# Patient Record
Sex: Female | Born: 1960 | Race: White | Hispanic: No | Smoking: Current every day smoker
Health system: Southern US, Community
[De-identification: ages and names within clinical notes are randomized; demographics above are authoritative.]

## PROBLEM LIST (undated history)

## (undated) DIAGNOSIS — J449 Chronic obstructive pulmonary disease, unspecified: Secondary | ICD-10-CM

## (undated) DIAGNOSIS — E785 Hyperlipidemia, unspecified: Secondary | ICD-10-CM

## (undated) DIAGNOSIS — K219 Gastro-esophageal reflux disease without esophagitis: Secondary | ICD-10-CM

## (undated) DIAGNOSIS — E876 Hypokalemia: Secondary | ICD-10-CM

## (undated) DIAGNOSIS — F329 Major depressive disorder, single episode, unspecified: Secondary | ICD-10-CM

## (undated) DIAGNOSIS — K7460 Unspecified cirrhosis of liver: Secondary | ICD-10-CM

## (undated) DIAGNOSIS — D649 Anemia, unspecified: Secondary | ICD-10-CM

## (undated) DIAGNOSIS — F32A Depression, unspecified: Secondary | ICD-10-CM

## (undated) DIAGNOSIS — N189 Chronic kidney disease, unspecified: Secondary | ICD-10-CM

## (undated) DIAGNOSIS — E871 Hypo-osmolality and hyponatremia: Secondary | ICD-10-CM

## (undated) DIAGNOSIS — F419 Anxiety disorder, unspecified: Secondary | ICD-10-CM

## (undated) DIAGNOSIS — IMO0002 Reserved for concepts with insufficient information to code with codable children: Secondary | ICD-10-CM

## (undated) DIAGNOSIS — E119 Type 2 diabetes mellitus without complications: Secondary | ICD-10-CM

## (undated) DIAGNOSIS — I10 Essential (primary) hypertension: Secondary | ICD-10-CM

## (undated) DIAGNOSIS — G709 Myoneural disorder, unspecified: Secondary | ICD-10-CM

## (undated) DIAGNOSIS — F1910 Other psychoactive substance abuse, uncomplicated: Secondary | ICD-10-CM

## (undated) HISTORY — DX: Unspecified cirrhosis of liver: K74.60

## (undated) HISTORY — DX: Chronic obstructive pulmonary disease, unspecified: J44.9

## (undated) HISTORY — DX: Myoneural disorder, unspecified: G70.9

## (undated) HISTORY — DX: Chronic kidney disease, unspecified: N18.9

## (undated) HISTORY — DX: Hypokalemia: E87.6

## (undated) HISTORY — PX: FRACTURE SURGERY: SHX138

## (undated) HISTORY — DX: Type 2 diabetes mellitus without complications: E11.9

## (undated) HISTORY — PX: CHOLECYSTECTOMY: SHX55

## (undated) HISTORY — DX: Other psychoactive substance abuse, uncomplicated: F19.10

## (undated) HISTORY — PX: SPINE SURGERY: SHX786

## (undated) HISTORY — DX: Anxiety disorder, unspecified: F41.9

## (undated) HISTORY — DX: Reserved for concepts with insufficient information to code with codable children: IMO0002

## (undated) HISTORY — DX: Gastro-esophageal reflux disease without esophagitis: K21.9

## (undated) HISTORY — PX: ORTHOPEDIC SURGERY: SHX850

## (undated) HISTORY — DX: Hyperlipidemia, unspecified: E78.5

## (undated) HISTORY — PX: TUBAL LIGATION: SHX77

## (undated) HISTORY — DX: Anemia, unspecified: D64.9

## (undated) HISTORY — DX: Hypo-osmolality and hyponatremia: E87.1

---

## 2015-06-17 ENCOUNTER — Inpatient Hospital Stay (HOSPITAL_COMMUNITY): Payer: Self-pay

## 2015-06-17 ENCOUNTER — Encounter (HOSPITAL_COMMUNITY): Payer: Self-pay | Admitting: *Deleted

## 2015-06-17 ENCOUNTER — Inpatient Hospital Stay (HOSPITAL_COMMUNITY): Payer: MEDICAID

## 2015-06-17 ENCOUNTER — Inpatient Hospital Stay (HOSPITAL_COMMUNITY)
Admission: EM | Admit: 2015-06-17 | Discharge: 2015-06-21 | DRG: 918 | Disposition: A | Discharge status: Other Acute Inpatient Hospital | Payer: Self-pay | Attending: Internal Medicine | Admitting: Internal Medicine

## 2015-06-17 DIAGNOSIS — F10229 Alcohol dependence with intoxication, unspecified: Secondary | ICD-10-CM | POA: Diagnosis present

## 2015-06-17 DIAGNOSIS — T450X2D Poisoning by antiallergic and antiemetic drugs, intentional self-harm, subsequent encounter: Secondary | ICD-10-CM

## 2015-06-17 DIAGNOSIS — T50901A Poisoning by unspecified drugs, medicaments and biological substances, accidental (unintentional), initial encounter: Secondary | ICD-10-CM

## 2015-06-17 DIAGNOSIS — R339 Retention of urine, unspecified: Secondary | ICD-10-CM | POA: Diagnosis present

## 2015-06-17 DIAGNOSIS — R45851 Suicidal ideations: Secondary | ICD-10-CM

## 2015-06-17 DIAGNOSIS — T450X2A Poisoning by antiallergic and antiemetic drugs, intentional self-harm, initial encounter: Principal | ICD-10-CM

## 2015-06-17 DIAGNOSIS — I10 Essential (primary) hypertension: Secondary | ICD-10-CM | POA: Diagnosis present

## 2015-06-17 DIAGNOSIS — R109 Unspecified abdominal pain: Secondary | ICD-10-CM

## 2015-06-17 DIAGNOSIS — F1020 Alcohol dependence, uncomplicated: Secondary | ICD-10-CM | POA: Diagnosis not present

## 2015-06-17 DIAGNOSIS — Z9049 Acquired absence of other specified parts of digestive tract: Secondary | ICD-10-CM | POA: Diagnosis present

## 2015-06-17 DIAGNOSIS — E876 Hypokalemia: Secondary | ICD-10-CM

## 2015-06-17 DIAGNOSIS — F329 Major depressive disorder, single episode, unspecified: Secondary | ICD-10-CM | POA: Diagnosis not present

## 2015-06-17 DIAGNOSIS — R945 Abnormal results of liver function studies: Secondary | ICD-10-CM

## 2015-06-17 DIAGNOSIS — F1994 Other psychoactive substance use, unspecified with psychoactive substance-induced mood disorder: Secondary | ICD-10-CM | POA: Diagnosis present

## 2015-06-17 DIAGNOSIS — F17210 Nicotine dependence, cigarettes, uncomplicated: Secondary | ICD-10-CM | POA: Diagnosis present

## 2015-06-17 DIAGNOSIS — T450X1A Poisoning by antiallergic and antiemetic drugs, accidental (unintentional), initial encounter: Secondary | ICD-10-CM | POA: Diagnosis present

## 2015-06-17 DIAGNOSIS — F419 Anxiety disorder, unspecified: Secondary | ICD-10-CM | POA: Diagnosis present

## 2015-06-17 DIAGNOSIS — R7989 Other specified abnormal findings of blood chemistry: Secondary | ICD-10-CM

## 2015-06-17 DIAGNOSIS — R251 Tremor, unspecified: Secondary | ICD-10-CM | POA: Diagnosis present

## 2015-06-17 HISTORY — DX: Essential (primary) hypertension: I10

## 2015-06-17 HISTORY — DX: Major depressive disorder, single episode, unspecified: F32.9

## 2015-06-17 HISTORY — DX: Depression, unspecified: F32.A

## 2015-06-17 LAB — CBC WITH DIFFERENTIAL/PLATELET
Basophils Absolute: 0 10*3/uL (ref 0.0–0.1)
Basophils Absolute: 0 K/uL (ref 0.0–0.1)
Basophils Relative: 1 % (ref 0–1)
Basophils Relative: 0 % (ref 0–1)
EOS PCT: 1 % (ref 0–5)
Eosinophils Absolute: 0.1 10*3/uL (ref 0.0–0.7)
Eosinophils Absolute: 0.1 K/uL (ref 0.0–0.7)
Eosinophils Relative: 1 % (ref 0–5)
HCT: 41.8 % (ref 36.0–46.0)
HEMATOCRIT: 42.5 % (ref 36.0–46.0)
Hemoglobin: 15 g/dL (ref 12.0–15.0)
Hemoglobin: 14.5 g/dL (ref 12.0–15.0)
LYMPHS ABS: 2.7 10*3/uL (ref 0.7–4.0)
Lymphocytes Relative: 41 % (ref 12–46)
Lymphocytes Relative: 29 % (ref 12–46)
Lymphs Abs: 2.3 K/uL (ref 0.7–4.0)
MCH: 35 pg — ABNORMAL HIGH (ref 26.0–34.0)
MCH: 35 pg — ABNORMAL HIGH (ref 26.0–34.0)
MCHC: 35.3 g/dL (ref 30.0–36.0)
MCHC: 34.7 g/dL (ref 30.0–36.0)
MCV: 99.3 fL (ref 78.0–100.0)
MCV: 101 fL — ABNORMAL HIGH (ref 78.0–100.0)
Monocytes Absolute: 0.6 10*3/uL (ref 0.1–1.0)
Monocytes Absolute: 0.7 K/uL (ref 0.1–1.0)
Monocytes Relative: 10 % (ref 3–12)
Monocytes Relative: 9 % (ref 3–12)
NEUTROS ABS: 3.1 10*3/uL (ref 1.7–7.7)
Neutro Abs: 4.8 K/uL (ref 1.7–7.7)
Neutrophils Relative %: 47 % (ref 43–77)
Neutrophils Relative %: 61 % (ref 43–77)
Platelets: 242 10*3/uL (ref 150–400)
Platelets: 245 K/uL (ref 150–400)
RBC: 4.28 MIL/uL (ref 3.87–5.11)
RBC: 4.14 MIL/uL (ref 3.87–5.11)
RDW: 11.7 % (ref 11.5–15.5)
RDW: 11.8 % (ref 11.5–15.5)
WBC: 6.6 10*3/uL (ref 4.0–10.5)
WBC: 7.9 K/uL (ref 4.0–10.5)

## 2015-06-17 LAB — COMPREHENSIVE METABOLIC PANEL WITH GFR
ALT: 37 U/L (ref 14–54)
AST: 83 U/L — ABNORMAL HIGH (ref 15–41)
Albumin: 3.7 g/dL (ref 3.5–5.0)
Alkaline Phosphatase: 153 U/L — ABNORMAL HIGH (ref 38–126)
Anion gap: 10 (ref 5–15)
BUN: <5 mg/dL — ABNORMAL LOW (ref 6–20)
CO2: 28 mmol/L (ref 22–32)
Calcium: 8.8 mg/dL — ABNORMAL LOW (ref 8.9–10.3)
Chloride: 101 mmol/L (ref 101–111)
Creatinine, Ser: 0.47 mg/dL (ref 0.44–1.00)
GFR calc Af Amer: >60 mL/min
GFR calc non Af Amer: >60 mL/min
Glucose, Bld: 131 mg/dL — ABNORMAL HIGH (ref 65–99)
Potassium: 3.4 mmol/L — ABNORMAL LOW (ref 3.5–5.1)
Sodium: 139 mmol/L (ref 135–145)
Total Bilirubin: 1.3 mg/dL — ABNORMAL HIGH (ref 0.3–1.2)
Total Protein: 6.4 g/dL — ABNORMAL LOW (ref 6.5–8.1)

## 2015-06-17 LAB — COMPREHENSIVE METABOLIC PANEL
ALT: 41 U/L (ref 14–54)
AST: 100 U/L — AB (ref 15–41)
Albumin: 3.8 g/dL (ref 3.5–5.0)
Alkaline Phosphatase: 159 U/L — ABNORMAL HIGH (ref 38–126)
Anion gap: 17 — ABNORMAL HIGH (ref 5–15)
BUN: <5 mg/dL — ABNORMAL LOW (ref 6–20)
CALCIUM: 9.2 mg/dL (ref 8.9–10.3)
CO2: 27 mmol/L (ref 22–32)
CREATININE: 0.58 mg/dL (ref 0.44–1.00)
Chloride: 97 mmol/L — ABNORMAL LOW (ref 101–111)
GFR calc Af Amer: >60 mL/min (ref >60)
Glucose, Bld: 126 mg/dL — ABNORMAL HIGH (ref 65–99)
Potassium: 2.5 mmol/L — CL (ref 3.5–5.1)
Sodium: 141 mmol/L (ref 135–145)
Total Bilirubin: 0.6 mg/dL (ref 0.3–1.2)
Total Protein: 6.9 g/dL (ref 6.5–8.1)

## 2015-06-17 LAB — RAPID URINE DRUG SCREEN, HOSP PERFORMED
AMPHETAMINES: NOT DETECTED
BENZODIAZEPINES: NOT DETECTED
Barbiturates: NOT DETECTED
COCAINE: NOT DETECTED
OPIATES: NOT DETECTED
TETRAHYDROCANNABINOL: NOT DETECTED

## 2015-06-17 LAB — MRSA PCR SCREENING: MRSA by PCR: NEGATIVE

## 2015-06-17 LAB — TROPONIN I
Troponin I: <0.03 ng/mL (ref <0.031)
Troponin I: <0.03 ng/mL

## 2015-06-17 LAB — SALICYLATE LEVEL
Salicylate Lvl: 10.2 mg/dL (ref 2.8–30.0)
Salicylate Lvl: <4 mg/dL (ref 2.8–30.0)

## 2015-06-17 LAB — ACETAMINOPHEN LEVEL

## 2015-06-17 LAB — MAGNESIUM: Magnesium: 1.9 mg/dL (ref 1.7–2.4)

## 2015-06-17 LAB — LIPASE, BLOOD: Lipase: 34 U/L (ref 22–51)

## 2015-06-17 LAB — ETHANOL: Alcohol, Ethyl (B): 66 mg/dL — ABNORMAL HIGH (ref <5)

## 2015-06-17 MED ORDER — POTASSIUM CHLORIDE 10 MEQ/100ML IV SOLN
10.0000 meq | INTRAVENOUS | Status: AC
  Administered 2015-06-17: 10 meq via INTRAVENOUS
  Filled 2015-06-17: qty 100

## 2015-06-17 MED ORDER — ADULT MULTIVITAMIN W/MINERALS CH
1.0000 | ORAL_TABLET | Freq: Every day | ORAL | Status: DC
  Administered 2015-06-17 – 2015-06-21 (×5): 1 via ORAL
  Filled 2015-06-17 (×5): qty 1

## 2015-06-17 MED ORDER — FOLIC ACID 1 MG PO TABS
1.0000 mg | ORAL_TABLET | Freq: Every day | ORAL | Status: DC
  Administered 2015-06-17 – 2015-06-21 (×5): 1 mg via ORAL
  Filled 2015-06-17 (×5): qty 1

## 2015-06-17 MED ORDER — SODIUM CHLORIDE 0.9 % IV SOLN
INTRAVENOUS | Status: DC
  Administered 2015-06-17: 05:00:00 via INTRAVENOUS

## 2015-06-17 MED ORDER — LORAZEPAM 1 MG PO TABS: 1.0000 mg | ORAL_TABLET | Freq: Four times a day (QID) | ORAL | Status: DC | PRN

## 2015-06-17 MED ORDER — LORAZEPAM 2 MG/ML IJ SOLN
0.5000 mg | Freq: Once | INTRAMUSCULAR | Status: AC
  Administered 2015-06-17: 0.5 mg via INTRAVENOUS
  Filled 2015-06-17: qty 1

## 2015-06-17 MED ORDER — LORAZEPAM 2 MG/ML IJ SOLN: 1.0000 mg | Freq: Once | INTRAMUSCULAR | Status: DC

## 2015-06-17 MED ORDER — THIAMINE HCL 100 MG/ML IJ SOLN
100.0000 mg | Freq: Every day | INTRAMUSCULAR | Status: DC
  Administered 2015-06-17: 100 mg via INTRAVENOUS
  Filled 2015-06-17 (×2): qty 2

## 2015-06-17 MED ORDER — POTASSIUM CHLORIDE IN NACL 20-0.9 MEQ/L-% IV SOLN
INTRAVENOUS | Status: AC
  Administered 2015-06-17 – 2015-06-18 (×3): via INTRAVENOUS
  Filled 2015-06-17 (×3): qty 1000

## 2015-06-17 MED ORDER — POTASSIUM CHLORIDE CRYS ER 20 MEQ PO TBCR
40.0000 meq | EXTENDED_RELEASE_TABLET | Freq: Once | ORAL | Status: AC
  Administered 2015-06-17: 40 meq via ORAL
  Filled 2015-06-17: qty 2

## 2015-06-17 MED ORDER — LORAZEPAM 2 MG/ML IJ SOLN: 2.0000 mg | Freq: Once | INTRAMUSCULAR | Status: DC

## 2015-06-17 MED ORDER — LORAZEPAM 2 MG/ML IJ SOLN: 0.0000 mg | Freq: Two times a day (BID) | INTRAMUSCULAR | Status: DC

## 2015-06-17 MED ORDER — POTASSIUM CHLORIDE 10 MEQ/100ML IV SOLN
10.0000 meq | INTRAVENOUS | Status: AC
  Administered 2015-06-17 (×2): 10 meq via INTRAVENOUS
  Filled 2015-06-17 (×2): qty 100

## 2015-06-17 MED ORDER — LORAZEPAM 2 MG/ML IJ SOLN
1.0000 mg | Freq: Four times a day (QID) | INTRAMUSCULAR | Status: DC | PRN
  Administered 2015-06-18: 1 mg via INTRAVENOUS
  Filled 2015-06-17: qty 1

## 2015-06-17 MED ORDER — VITAMIN B-1 100 MG PO TABS
100.0000 mg | ORAL_TABLET | Freq: Every day | ORAL | Status: DC
  Administered 2015-06-18 – 2015-06-21 (×4): 100 mg via ORAL
  Filled 2015-06-17 (×4): qty 1

## 2015-06-17 MED ORDER — ENOXAPARIN SODIUM 40 MG/0.4ML ~~LOC~~ SOLN
40.0000 mg | SUBCUTANEOUS | Status: DC
  Administered 2015-06-17 – 2015-06-21 (×5): 40 mg via SUBCUTANEOUS
  Filled 2015-06-17 (×5): qty 0.4

## 2015-06-17 MED ORDER — ACETAMINOPHEN 650 MG RE SUPP: 650.0000 mg | Freq: Four times a day (QID) | RECTAL | Status: DC | PRN

## 2015-06-17 MED ORDER — ONDANSETRON HCL 4 MG/2ML IJ SOLN: 4.0000 mg | Freq: Four times a day (QID) | INTRAMUSCULAR | Status: DC | PRN

## 2015-06-17 MED ORDER — ACETAMINOPHEN 325 MG PO TABS
650.0000 mg | ORAL_TABLET | Freq: Four times a day (QID) | ORAL | Status: DC | PRN
  Administered 2015-06-20 – 2015-06-21 (×2): 650 mg via ORAL
  Filled 2015-06-17 (×2): qty 2

## 2015-06-17 MED ORDER — PANTOPRAZOLE SODIUM 40 MG IV SOLR
40.0000 mg | INTRAVENOUS | Status: DC
  Administered 2015-06-17 – 2015-06-18 (×2): 40 mg via INTRAVENOUS
  Filled 2015-06-17 (×2): qty 40

## 2015-06-17 MED ORDER — ONDANSETRON HCL 4 MG PO TABS
4.0000 mg | ORAL_TABLET | Freq: Four times a day (QID) | ORAL | Status: DC | PRN
  Administered 2015-06-20: 4 mg via ORAL
  Filled 2015-06-17: qty 1

## 2015-06-17 MED ORDER — LORAZEPAM 2 MG/ML IJ SOLN
0.0000 mg | Freq: Four times a day (QID) | INTRAMUSCULAR | Status: DC
  Administered 2015-06-17: 2 mg via INTRAVENOUS
  Administered 2015-06-18: 1 mg via INTRAVENOUS
  Filled 2015-06-17 (×2): qty 1
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
## 2015-06-17 NOTE — ED Notes (Signed)
Per [facility] - pt admits to seeing a man that is looking in windows however no man is there - pt admits to attempting to overdose on benadryl. Pt recently separated from her husband and is living in a hotel. Pt is slow to respond however A&Ox4.

## 2015-06-17 NOTE — Care Management Note (Signed)
Case Management Note  Patient Details  Name: Michelle Michael MRN: [id number] Date of Birth: [birth year]  Subjective/Objective:                 overdose   Action/Plan:  bhh   Expected Discharge Date:       [id number]           [facility]  [facility]  In-House Referral:  Clinical Social Work  Discharge planning Services  CM Consult  Post Acute Care Choice:  NA Choice offered to:  NA  DME Arranged:  N/A DME Agency:  NA  HH Arranged:  NA HH Agency:  NA  Status of Service:  In process, will continue to follow  Medicare Important Message Given:    Date Medicare IM Given:    Medicare IM give by:    Date Additional Medicare IM Given:    Additional Medicare Important Message give by:     If discussed at Long Length of Stay Meetings, dates discussed:    Additional Comments:  Golda Acre, RN 06/17/2015, 8:45 AM

## 2015-06-17 NOTE — ED Provider Notes (Signed)
CSN: [id number]     Arrival date & time 06/17/15  0204 History   First MD Initiated Contact with Patient 06/17/15 0209     Chief Complaint  Patient presents with  . Drug Overdose  . Hallucinations     (Consider location/radiation/quality/duration/timing/severity/associated sxs/prior Treatment) HPI Patient presents by EMS after intentional overdose of Benadryl. Patient says she took a handful of Benadryl because she "did not want to wake up". She does not know the dosage of the Benadryl or the exact number. Per EMS there were 24 tabs missing from the bottle. She states she has a prior history of depression but is currently on no medication. She has overdosed on medication in the past. She states that she saw a man looking in her windows. She is currently denying any hallucinations. Admits to dry mouth. She states she drank alcohol earlier this today but denies any other coingestants. Past Medical History  Diagnosis Date  . Depression   . Hypertension    Past Surgical History  Procedure Laterality Date  . Orthopedic surgery     History reviewed. No pertinent family history. History  Substance Use Topics  . Smoking status: Current Every Day Smoker -- 1.00 packs/day    Types: Cigarettes  . Smokeless tobacco: Not on file  . Alcohol Use: Yes     Comment: "2 quartz of beer a day"   OB History    No data available     Review of Systems  Respiratory: Negative for shortness of breath.   Cardiovascular: Negative for chest pain.  Gastrointestinal: Negative for nausea, vomiting and abdominal pain.  Musculoskeletal: Negative for back pain and neck pain.  Skin: Negative for rash.  Neurological: Positive for headaches. Negative for dizziness, weakness and numbness.  Psychiatric/Behavioral: Positive for suicidal ideas, hallucinations, self-injury and dysphoric mood. The patient is nervous/anxious.   All other systems reviewed and are negative.     Allergies  Review of patient's  allergies indicates no known allergies.  Home Medications   Prior to Admission medications   Medication Sig Start Date End Date Taking? Authorizing Provider  acetaminophen (TYLENOL) 325 MG tablet Take 650 mg by mouth every 6 (six) hours as needed for moderate pain.   Yes Historical Provider, MD  Aspirin-Acetaminophen-Caffeine (GOODYS EXTRA STRENGTH) [phone] MG PACK Take 1 Package by mouth every 6 (six) hours as needed (pain).   Yes Historical Provider, MD  diphenhydrAMINE (BENADRYL) 25 MG tablet Take 25 mg by mouth every 6 (six) hours as needed for allergies.   Yes Historical Provider, MD  hydroxypropyl methylcellulose / hypromellose (ISOPTO TEARS / GONIOVISC) 2.5 % ophthalmic solution Place 1 drop into both eyes 3 (three) times daily as needed for dry eyes.   Yes Historical Provider, MD   BP 133/72 mmHg  Pulse 119  Temp(Src) 98.7 F (37.1 C) (Oral)  Resp 20  SpO2 96% Physical Exam  Constitutional: She is oriented to person, place, and time. She appears well-developed and well-nourished. No distress.  HENT:  Head: Normocephalic and atraumatic.  Mouth/Throat: Oropharynx is clear and moist.  Eyes: EOM are normal. Pupils are equal, round, and reactive to light.  Neck: Normal range of motion. Neck supple.  Cardiovascular: Regular rhythm.   Tachycardia  Pulmonary/Chest: Effort normal and breath sounds normal. No respiratory distress. She has no wheezes. She has no rales.  Abdominal: Soft. Bowel sounds are normal.  Musculoskeletal: Normal range of motion. She exhibits no edema or tenderness.  Neurological: She is alert and oriented to person,  place, and time.  Moves all extremities.  Skin: Skin is warm and dry. No rash noted. No erythema.  Psychiatric:  Patient is very anxious appearing. She is tearful.  Nursing note and vitals reviewed.   ED Course  Procedures (including critical care time) Labs Review Labs Reviewed  CBC WITH DIFFERENTIAL/PLATELET - Abnormal; Notable for the  following:    MCH 35.0 (*)    All other components within normal limits  COMPREHENSIVE METABOLIC PANEL - Abnormal; Notable for the following:    Potassium 2.5 (*)    Chloride 97 (*)    Glucose, Bld 126 (*)    BUN <5 (*)    AST 100 (*)    Alkaline Phosphatase 159 (*)    Anion gap 17 (*)    All other components within normal limits  ETHANOL - Abnormal; Notable for the following:    Alcohol, Ethyl (B) 66 (*)    All other components within normal limits  ACETAMINOPHEN LEVEL - Abnormal; Notable for the following:    Acetaminophen (Tylenol), Serum <10 (*)    All other components within normal limits  SALICYLATE LEVEL  URINE RAPID DRUG SCREEN, HOSP PERFORMED    Imaging Review No results found.   EKG Interpretation   Date/Time:  Wednesday June 17 2015 02:41:49 EDT Ventricular Rate:  111 PR Interval:  153 QRS Duration: 88 QT Interval:  371 QTC Calculation: 504 R Axis:   -21 Text Interpretation:  Sinus tachycardia Biatrial enlargement Probable  inferior infarct, old Confirmed by Ranae Palms  MD, Lissette Schenk ([id number]) on  06/17/2015 4:25:52 AM      MDM   Final diagnoses:  Intentional diphenhydramine overdose, initial encounter  Hypokalemia    RN discussed with poison control. Recommended observation for 6 hours and supportive care for anticholinergic side effects.  Patient with persistent agitation, tachycardia. Potassium replacement initiated in the emergency department. Discuss with Dr.Kakrakandy and will admit to step down bed.  Loren Racer, MD 06/17/15 [phone]

## 2015-06-17 NOTE — H&P (Addendum)
[facility] History and Physical  [place] SMO:[id number] DOB: [birth year] DOA: 06/17/2015  Referring physician: Dr.Yelverton. PCP: No PCP Per Patient None. Specialists: None.  Chief Complaint: Drug overdose.  HPI: Michelle Michael is a 54 y.o. female with history of depression and hypertension present on no medications was brought to the ER after patient called EMS after taking 24 pills of diphenhydramine (strength exactly not known). Patient states she was depressed and suicidal. In the ER patient was found to be tremulous and tachycardic with mild confusion and poison control was contacted and was advised to give patient when necessary Ativan as needed based on patient's symptoms. Patient is also positive for alcohol and patient states she drinks alcohol everyday. Patient states she has been recently separated from her husband and has been depressed. Denies overdosing with any other medications. Patient does take Goody powder everyday for headache. Salicylate levels are undetectable. Patient has been admitted for further management of drug overdose and suicidal.   Review of Systems: As presented in the history of presenting illness, rest negative.  Past Medical History  Diagnosis Date  . Depression   . Hypertension    Past Surgical History  Procedure Laterality Date  . Orthopedic surgery    . Cholecystectomy     Social History:  reports that she has been smoking Cigarettes.  She has been smoking about 1.00 pack per day. She does not have any smokeless tobacco history on file. She reports that she drinks alcohol. She reports that she does not use illicit drugs. Where does patient live home. Can patient participate in ADLs? Yes.  No Known Allergies  Family History:  Family History  Problem Relation Age of Onset  . Hypertension Mother   . Hypertension Father       Prior to Admission medications   Medication Sig Start Date End Date Taking? Authorizing Provider  acetaminophen  (TYLENOL) 325 MG tablet Take 650 mg by mouth every 6 (six) hours as needed for moderate pain.   Yes Historical Provider, MD  Aspirin-Acetaminophen-Caffeine (GOODYS EXTRA STRENGTH) [phone] MG PACK Take 1 Package by mouth every 6 (six) hours as needed (pain).   Yes Historical Provider, MD  diphenhydrAMINE (BENADRYL) 25 MG tablet Take 25 mg by mouth every 6 (six) hours as needed for allergies.   Yes Historical Provider, MD  hydroxypropyl methylcellulose / hypromellose (ISOPTO TEARS / GONIOVISC) 2.5 % ophthalmic solution Place 1 drop into both eyes 3 (three) times daily as needed for dry eyes.   Yes Historical Provider, MD    Physical Exam: Filed Vitals:   06/17/15 0345 06/17/15 0430 06/17/15 0445 06/17/15 0501  BP: 133/72 160/92 140/87   Pulse: 119 112 108 106  Temp:      TempSrc:      Resp: 20 18 16 17   SpO2: 96% 90% 88% 91%     General:  Moderately built and nourished.  Eyes: Anicteric. No pallor.  ENT: No discharge from the ears eyes nose and mouth.  Neck: No mass felt.  Cardiovascular: S1 and S2 heard.  Respiratory: No rhonchi or crepitations.  Abdomen: Soft nontender bowel sounds present.  Skin: No rash.  Musculoskeletal: No edema.  Psychiatric: Patient has suicidal ideation. Patient is depressed.  Neurologic: Alert awake oriented to time place and person. Moves all extremities. Patient has tremors.  Labs on Admission:  Basic Metabolic Panel:  Recent Labs Lab 06/17/15 0255  NA 141  K 2.5*  CL 97*  CO2 27  GLUCOSE 126*  BUN <5*  CREATININE 0.58  CALCIUM 9.2   Liver Function Tests:  Recent Labs Lab 06/17/15 0255  AST 100*  ALT 41  ALKPHOS 159*  BILITOT 0.6  PROT 6.9  ALBUMIN 3.8   No results for input(s): LIPASE, AMYLASE in the last 168 hours. No results for input(s): AMMONIA in the last 168 hours. CBC:  Recent Labs Lab 06/17/15 0255  WBC 6.6  NEUTROABS 3.1  HGB 15.0  HCT 42.5  MCV 99.3  PLT 242   Cardiac Enzymes: No results for  input(s): CKTOTAL, CKMB, CKMBINDEX, TROPONINI in the last 168 hours.  BNP (last 3 results) No results for input(s): BNP in the last 8760 hours.  ProBNP (last 3 results) No results for input(s): PROBNP in the last 8760 hours.  CBG: No results for input(s): GLUCAP in the last 168 hours.  Radiological Exams on Admission: No results found.  EKG: Independently reviewed. Sinus tachycardia with QRS of 88 ms and QTC of 504 ms.  Assessment/Plan Principal Problem:   Intentional diphenhydramine overdose Active Problems:   Suicidal ideation   Alcoholism   Hypokalemia   Diphenhydramine overdose   1. Intentional overdose of diphenhydramine with suicidal ideation - I have discussed with poison control at this time and point and control is advised to give when necessary Ativan based on patient's symptoms. Mainly watch out for any encephalopathy increasing heart rate elevated blood pressure when patient may need additional doses of Ativan every 45 minutes. Patient is already placed on when necessary Ativan for alcohol withdrawal protocol and may need additional doses based on the above symptoms mentioned. Closely monitor and stepdown as patient is likely to have prolonged course as per the poison control. Closely monitor for any seizures. Suicidal precautions. Consult psychiatry once patient is medically stable. 2. Hypokalemia - replace and recheck. Check magnesium levels. 3. Depression with suicidal ideation - consult psych Ativan patient is medically stable. Suicide precautions. 4. Alcoholism - patient has been placed on alcohol withdrawal protocol. 5. Elevated LFTs - possibly secondary to alcoholism. Check acute hepatitis panel.  Addendum - after arrival to the stepdown unit patient started complaining of severe epigastric pain. I have ordered a stat lipase, recheck LFTs, troponin, sonogram of abdomen, and place patient on Protonix. Check EKG.   DVT Prophylaxis Lovenox.  Code Status: Full code.   Family Communication: Discussed with patient.  Disposition Plan: Admit to inpatient.    KAKRAKANDY,ARSHAD N. [facility] Pager [phone].  If 7PM-7AM, please contact night-coverage [email or web address] Password [facility] 06/17/2015, 5:29 AM

## 2015-06-17 NOTE — Progress Notes (Signed)
Date:  June 16, 2015 U.R. performed for needs and level of care. Will continue to follow for Case Management needs.  Altus Zaino, RN, BSN, CCM   [phone] 

## 2015-06-17 NOTE — ED Notes (Signed)
Pt states "I just took a handful of pills because I just wanted to go to sleep." Pt tearful during discussion of SI.

## 2015-06-17 NOTE — Clinical Social Work Psych Assess (Signed)
Clinical Social Work Librarian, academic  Clinical Social Worker:  [place], [place] Date/Time:  06/17/2015, 2:17 PM Referred By:  Physician Date Referred:  06/17/15 Reason for Referral:  Behavioral Health Issues   Presenting Symptoms/Problems  Presenting Symptoms/Problems(in person's/family's own words):  Psych consulted due to overdose.   Abuse/Neglect/Trauma History  Abuse/Neglect/Trauma History:  Denies History Abuse/Neglect/Trauma History Comments (indicate dates):  N/A   Psychiatric History  Psychiatric History:  Denies History Psychiatric Medication:  None currently   Current Mental Health Hospitalizations/Previous Mental Health History:  Patient was at [facility] about 6 months ago for detox. Patient denies any previous MH hospitalizations.    Current Provider:  N/A Place and Date:  N/A  Current Medications:    Scheduled Meds: . enoxaparin (LOVENOX) injection  40 mg Subcutaneous Q24H  . folic acid  1 mg Oral Daily  . LORazepam  0-4 mg Intravenous Q6H   Followed by  . [START ON 06/19/2015] LORazepam  0-4 mg Intravenous Q12H  . LORazepam  1 mg Intravenous Once  . multivitamin with minerals  1 tablet Oral Daily  . pantoprazole (PROTONIX) IV  40 mg Intravenous Q24H  . thiamine  100 mg Oral Daily   Or  . thiamine  100 mg Intravenous Daily   Continuous Infusions: . 0.9 % NaCl with KCl 20 mEq / L 100 mL/hr at 06/17/15 0628   PRN Meds:.acetaminophen **OR** acetaminophen, LORazepam **OR** LORazepam, ondansetron **OR** ondansetron (ZOFRAN) IV     Previous Inpatient Admission/Date/Reason:  Patient had detox at [facility] about 6 months ago. Patient had residential SA treatment several years ago.   Emotional Health/Current Symptoms  Suicide/Self Harm: Suicide Attempt in the Past (date/description) Suicide Attempt in Past (date/description):  Patient admitted after drinking alcohol and overdosing on Benadryl   Other Harmful Behavior  (ex. homicidal ideation) (describe):  N/A   Psychotic/Dissociative Symptoms  Psychotic/Dissociative Symptoms: Visual Hallucinations Other Psychotic/Dissociative Symptoms:  Patient reports she saw a man at her hotel but knows that he was not real. Patient reports this was first [facility].   Attention/Behavioral Symptoms  Attention/Behavioral Symptoms: Withdrawn Other Attention/Behavioral Symptoms:  Patient guarded with flat affect. Patient took long pauses prior to answering questions.    Cognitive Impairment  Cognitive Impairment:  Within Normal Limits Other Cognitive Impairment:  N/A   Mood and Adjustment  Mood and Adjustment:  Flat   Stress, Anxiety, Trauma, Any Recent Loss/Stressor  Stress, Anxiety, Trauma, Any Recent Loss/Stressor: Relationship, Other - See Comment Anxiety (frequency):  N/A  Phobia (specify):  N/A  Compulsive Behavior (specify):  N/A  Obsessive Behavior (specify):  N/A  Other Stress, Anxiety, Trauma, Any Recent Loss/Stressor:  Patient and boyfriend of 9 years recently separated. Patient is currently homeless and staying at a hotel. Patient is unemployed.   Substance Abuse/Use  Substance Abuse/Use: Current Substance Use SBIRT Completed (please refer for detailed history): Yes Self-reported Substance Use (last use and frequency):  Patient reports that she last drank on day of admission. Patient reports a long history of substance use. Patient has experienced DTs in the past and feels shaky and sweaty today.  Urinary Drug Screen Completed: Yes Alcohol Level:  66   Environment/Housing/Living Arrangement  Environmental/Housing/Living Arrangement: Homeless Who is in the Home:  Hotel  Emergency Contact:  N/A   Financial  Financial: IPRS   Patient's Strengths and Goals  Patient's Strengths and Goals (patient's own words):  Patient has attempted treatment in the past for substance use.    Clinical  Social Worker's Interpretive  Summary  Clinical Social Workers Interpretive Summary:    CSW and psych MD rounded together to evaluate patient. Patient laying in bed with eyes closed when team arrived. Patient has flat affect and avoids eye contact. Patient speaks softly and reports it is difficult to talk about her suicide attempt.  Patient was with current boyfriend for about 9 years before they separated a couple of weeks ago. Patient and ex-boyfriend continue to live at the same hotel but in different rooms. Patient reports the recent break up has been difficult and that she feels she has ruined her life. Patient was upset about her living situation and relationship when she attempted her overdose because she reports she no longer cared what happened to her. Patient reports she called non-emergency police who transported her to the hospital.  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 Patient reports that she has drank alcohol for several years but never stated if alcohol was a contributing factor to why she is no longer working. Patient reports that she has tried treatment in the past but has been unsuccessful. Patient reports that she has never attempted suicide in the past and does not have a MH history but does feel depressed.   Psych MD recommending inpatient psychiatric hospitalization at [facility]. CSW will continue to follow and will assist with transfer once medically stable.  [place], [place] [phone]   Disposition  Disposition: Recommend Psych CSW Continuing To Support While In [facility]

## 2015-06-17 NOTE — ED Notes (Signed)
Bed: GG26 Expected date:  Expected time:  Means of arrival:  Comments: EMS 54yo hallucinations, ? Overdose on Benadryl

## 2015-06-17 NOTE — Progress Notes (Addendum)
Patient Demographics  Michelle Michael, is a 54 y.o. female, DOB - [birth year], EAV:[id number]  Admit date - 06/17/2015   Admitting Physician Eduard Clos, MD  Outpatient Primary MD for the patient is No PCP Per Patient  LOS - 0   Chief Complaint  Patient presents with  . Drug Overdose  . Hallucinations       Admission HPI/Brief narrative: 54 year old female with history of depression, hypertension resents with suicide attempt with ingestion of 24 pills of diphenhydramine, as well as endorses heavy alcohol Abuse, a shunt was admitted to step down.  Subjective:   Michelle Michael still sleepy , but she denies any headache, chest pain, nausea, cough , and he denies any abdominal pain .   Assessment & Plan    Principal Problem:   Intentional diphenhydramine overdose Active Problems:   Suicidal ideation   Alcoholism   Hypokalemia   Diphenhydramine overdose  Intentional diphenhydramine overdose - Continue to monitor on step down for the next 24 hours, continue with telemetry monitoring, admitting physician discussed with poison control center, they recommend Ativan as needed, which patient is on as of part of CIWA protocol.  Depression with suicidal attempt -  Continue suicide precaution, psychiatry consulted.  Hypokalemia - Related, magnesium within normal limits, recheck in a.m.Marland Kitchen  Urinary retention - Agent had 650 mL post residual volume, this is most likely related to diphenhydramine overdose, will insert Foley catheter, and attempted voiding trial in 24 hours.  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  Complaining of abdominal pain admitting physician - Currently denies any abdominal pain, benign abdominal exam, normal abdominal x-ray, lipase within normal limit.  Code Status:  Full  Family Communication: None at bedside  Disposition Plan: Pending psych evaluation and medical stability, remains in stepdown   Procedures  None   Consults   Psychiatry   Medications  Scheduled Meds: . enoxaparin (LOVENOX) injection  40 mg Subcutaneous Q24H  . folic acid  1 mg Oral Daily  . LORazepam  0-4 mg Intravenous Q6H   Followed by  . [START ON 06/19/2015] LORazepam  0-4 mg Intravenous Q12H  . LORazepam  1 mg Intravenous Once  . multivitamin with minerals  1 tablet Oral Daily  . pantoprazole (PROTONIX) IV  40 mg Intravenous Q24H  . thiamine  100 mg Oral Daily   Or  . thiamine  100 mg Intravenous Daily   Continuous Infusions: . 0.9 % NaCl with KCl 20 mEq / L 100 mL/hr at 06/17/15 0628   PRN Meds:.acetaminophen **OR** acetaminophen, LORazepam **OR** LORazepam, ondansetron **OR** ondansetron (ZOFRAN) IV  DVT Prophylaxis  Lovenox -   Lab Results  Component Value Date   PLT 245 06/17/2015    Antibiotics    Anti-infectives    None          Objective:   Filed Vitals:   06/17/15 0530 06/17/15 0545 06/17/15 0700 06/17/15 0800  BP: 166/96 153/86 180/100 150/73  Pulse:  107 113 113  Temp:  98 F (36.7 C)  98.5 F (36.9 C)  TempSrc:  Oral  Oral  Resp: Height:   (1.651 m)  Weight:  65.8 kg (145 lb 1 oz)    SpO2: 99% 98% 98% 91%    Wt Readings from Last 3 Encounters:  06/17/15 65.8 kg (145 lb 1 oz)     Intake/Output Summary (Last 24 hours) at 06/17/15 1045 Last data filed at 06/17/15 1610  Gross per 24 hour  Intake 1173.33 ml  Output      0 ml  Net 1173.33 ml     Physical Exam  sleeping, but arousable, answers questions No new F.N deficits,  [place].AT,PERRAL Supple Neck,No JVD, No cervical lymphadenopathy appriciated.  Symmetrical Chest wall movement, Good air movement bilaterally, CTAB RRR,No Gallops,Rubs or new Murmurs, No Parasternal Heave +ve B.Sounds, Abd Soft, No tenderness, No organomegaly appriciated, No  rebound - guarding or rigidity. No Cyanosis, Clubbing or edema, No new Rash or bruise     Data Review   Micro Results Recent Results (from the past 240 hour(s))  MRSA PCR Screening     Status: None   Collection Time: 06/17/15  6:21 AM  Result Value Ref Range Status   MRSA by PCR NEGATIVE NEGATIVE Final    Comment:        The GeneXpert MRSA Assay (FDA approved for NASAL specimens only), is one component of a comprehensive MRSA colonization surveillance program. It is not intended to diagnose MRSA infection nor to guide or monitor treatment for MRSA infections.     Radiology Reports Dg Chest Port 1 View  06/17/2015   CLINICAL DATA:  Benadryl overdose.  Hypertension.  EXAM: PORTABLE CHEST - 1 VIEW  COMPARISON:  None.  FINDINGS: Mediastinum and hilar structures normal. Mild left base subsegmental atelectasis/infiltrate versus prominent epicardial fat pad. Lungs are otherwise clear. No pleural effusion or pneumothorax. Heart size normal. No acute bony abnormality .  IMPRESSION: Mild left base subsegmental atelectasis/ infiltrate versus prominent epicardial fat pad. Chest is otherwise negative.   Electronically Signed   By: Maisie Fus  Register   On: 06/17/2015 07:42   Dg Abd Portable 1v  06/17/2015   CLINICAL DATA:  Epigastric pain.  EXAM: PORTABLE ABDOMEN - 1 VIEW  COMPARISON:  None.  FINDINGS: Nonobstructive bowel gas pattern. Prior cholecystectomy. No free air. No organomegaly or suspicious calcification.  IMPRESSION: No acute findings.   Electronically Signed   By: Charlett Nose M.D.   On: 06/17/2015 07:11     CBC  Recent Labs Lab 06/17/15 0255 06/17/15 0637  WBC 6.6 7.9  HGB 15.0 14.5  HCT 42.5 41.8  PLT 242 245  MCV 99.3 101.0*  MCH 35.0* 35.0*  MCHC 35.3 34.7  RDW 11.7 11.8  LYMPHSABS 2.7 2.3  MONOABS 0.6 0.7  EOSABS 0.1 0.1  BASOSABS 0.0 0.0    Chemistries   Recent Labs Lab 06/17/15 0255 06/17/15 0637  NA 141 139  K 2.5* 3.4*  CL 97* 101  CO2 27 28    GLUCOSE 126* 131*  BUN <5* <5*  CREATININE 0.58 0.47  CALCIUM 9.2 8.8*  MG  --  1.9  AST 100* 83*  ALT 41 37  ALKPHOS 159* 153*  BILITOT 0.6 1.3*   ------------------------------------------------------------------------------------------------------------------ estimated creatinine clearance is 72.3 mL/min (by C-G formula based on Cr of 0.47). ------------------------------------------------------------------------------------------------------------------ No results for input(s): HGBA1C in the last 72 hours. ------------------------------------------------------------------------------------------------------------------ No results for input(s): CHOL, HDL, LDLCALC, TRIG, CHOLHDL, LDLDIRECT in the last 72 hours. ------------------------------------------------------------------------------------------------------------------ No results for input(s): TSH, T4TOTAL, T3FREE, THYROIDAB in the last 72 hours.  Invalid input(s): FREET3 ------------------------------------------------------------------------------------------------------------------ No results for input(s): VITAMINB12, FOLATE,  FERRITIN, TIBC, IRON, RETICCTPCT in the last 72 hours.  Coagulation profile No results for input(s): INR, PROTIME in the last 168 hours.  No results for input(s): DDIMER in the last 72 hours.  Cardiac Enzymes  Recent Labs Lab 06/17/15 0637  TROPONINI <0.03   ------------------------------------------------------------------------------------------------------------------ Invalid input(s): POCBNP     Time Spent in minutes   30 minutes   Genny Caulder M.D on 06/17/2015 at 10:45 AM  Between 7am to 7pm - Pager - [phone]  After 7pm go to [email or web address] - password [facility]  [facility]   Office  [phone]

## 2015-06-18 DIAGNOSIS — R45851 Suicidal ideations: Secondary | ICD-10-CM

## 2015-06-18 DIAGNOSIS — T450X2A Poisoning by antiallergic and antiemetic drugs, intentional self-harm, initial encounter: Principal | ICD-10-CM

## 2015-06-18 DIAGNOSIS — F329 Major depressive disorder, single episode, unspecified: Secondary | ICD-10-CM | POA: Diagnosis not present

## 2015-06-18 DIAGNOSIS — F1020 Alcohol dependence, uncomplicated: Secondary | ICD-10-CM | POA: Diagnosis not present

## 2015-06-18 DIAGNOSIS — E876 Hypokalemia: Secondary | ICD-10-CM

## 2015-06-18 LAB — COMPREHENSIVE METABOLIC PANEL
ALBUMIN: 3.1 g/dL — AB (ref 3.5–5.0)
ALT: 24 U/L (ref 14–54)
AST: 49 U/L — AB (ref 15–41)
Alkaline Phosphatase: 146 U/L — ABNORMAL HIGH (ref 38–126)
Anion gap: 9 (ref 5–15)
BILIRUBIN TOTAL: 1.8 mg/dL — AB (ref 0.3–1.2)
BUN: <5 mg/dL — ABNORMAL LOW (ref 6–20)
CO2: 21 mmol/L — ABNORMAL LOW (ref 22–32)
Calcium: 8.9 mg/dL (ref 8.9–10.3)
Chloride: 108 mmol/L (ref 101–111)
Creatinine, Ser: 0.51 mg/dL (ref 0.44–1.00)
GFR calc Af Amer: >60 mL/min (ref >60)
GFR calc non Af Amer: >60 mL/min (ref >60)
Glucose, Bld: 106 mg/dL — ABNORMAL HIGH (ref 65–99)
POTASSIUM: 5.1 mmol/L (ref 3.5–5.1)
Sodium: 138 mmol/L (ref 135–145)
Total Protein: 5.9 g/dL — ABNORMAL LOW (ref 6.5–8.1)

## 2015-06-18 LAB — CBC
HCT: 42.3 % (ref 36.0–46.0)
HEMOGLOBIN: 13.6 g/dL (ref 12.0–15.0)
MCH: 33.3 pg (ref 26.0–34.0)
MCHC: 32.2 g/dL (ref 30.0–36.0)
MCV: 103.4 fL — ABNORMAL HIGH (ref 78.0–100.0)
Platelets: 214 10*3/uL (ref 150–400)
RBC: 4.09 MIL/uL (ref 3.87–5.11)
RDW: 11.6 % (ref 11.5–15.5)
WBC: 7.5 10*3/uL (ref 4.0–10.5)

## 2015-06-18 LAB — HEPATITIS PANEL, ACUTE
HCV Ab: <0.1 s/co ratio (ref 0.0–0.9)
HEP B C IGM: NEGATIVE
Hep A IgM: NEGATIVE
Hepatitis B Surface Ag: NEGATIVE

## 2015-06-18 LAB — MAGNESIUM: Magnesium: 1.9 mg/dL (ref 1.7–2.4)

## 2015-06-18 MED ORDER — LORAZEPAM 2 MG/ML IJ SOLN
2.0000 mg | INTRAMUSCULAR | Status: DC | PRN
  Administered 2015-06-18 – 2015-06-19 (×3): 2 mg via INTRAVENOUS
  Filled 2015-06-18 (×3): qty 1

## 2015-06-18 MED ORDER — PANTOPRAZOLE SODIUM 40 MG PO TBEC
40.0000 mg | DELAYED_RELEASE_TABLET | Freq: Every day | ORAL | Status: DC
  Administered 2015-06-19 – 2015-06-21 (×3): 40 mg via ORAL
  Filled 2015-06-18 (×3): qty 1

## 2015-06-18 MED ORDER — NICOTINE 21 MG/24HR TD PT24
21.0000 mg | MEDICATED_PATCH | Freq: Every day | TRANSDERMAL | Status: DC
  Administered 2015-06-18 – 2015-06-21 (×4): 21 mg via TRANSDERMAL
  Filled 2015-06-18 (×4): qty 1

## 2015-06-18 MED ORDER — SODIUM CHLORIDE 0.9 % IV SOLN
INTRAVENOUS | Status: DC
  Administered 2015-06-18 – 2015-06-19 (×3): via INTRAVENOUS

## 2015-06-18 MED ORDER — SERTRALINE HCL 25 MG PO TABS
25.0000 mg | ORAL_TABLET | Freq: Every day | ORAL | Status: DC
  Administered 2015-06-18: 25 mg via ORAL
  Filled 2015-06-18: qty 1

## 2015-06-18 NOTE — Progress Notes (Signed)
Patient Demographics  Michelle Michael, is a 54 y.o. female, DOB - 15-Jan-1961, ZOX:[id number]  Admit date - 06/17/2015   Admitting Physician Eduard Clos, MD  Outpatient Primary MD for the patient is No PCP Per Patient  LOS - 1   Chief Complaint  Patient presents with  . Drug Overdose  . Hallucinations       Admission HPI/Brief narrative: 54 year old female with history of depression, hypertension resents with suicide attempt with ingestion of 24 pills of diphenhydramine, as well as endorses heavy alcohol Abuse, patient was admitted to step down.  Subjective:   Michelle Michael  denies any headache, chest pain, nausea, cough , and he denies any abdominal pain .   Assessment & Plan    Principal Problem:   Intentional diphenhydramine overdose Active Problems:   Suicidal ideation   Alcoholism   Hypokalemia   Diphenhydramine overdose   Substance induced mood disorder  Intentional diphenhydramine overdose - Continue to monitor on step down for the next 24 hours, no significant events on telemetry.  Depression with suicidal attempt -  Continue suicide precaution, psychiatry  consult appreciated.  Hypokalemia - Resolved  Urinary retention - Agent had 650 mL post residual volume, this is most likely related to diphenhydramine overdoFoley catheter inserted 6/22, DC today and attempted voiding trial .  Alcohol abuse - Continue with CIWA protocol,  has some tremors today, will change CIWA protocol from medical floor to stepdown protocol.  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  Complaining of abdominal pain - resolved - denies any abdominal pain, benign abdominal exam, normal abdominal x-ray, lipase within normal limit.  Code Status: Full  Family Communication:  None at bedside  Disposition Plan: remains in stepdown for next 24 hours to prevent DTs , will need inpatient psych admission when medically cleared    Procedures  None   Consults   Psychiatry   Medications  Scheduled Meds: . enoxaparin (LOVENOX) injection  40 mg Subcutaneous Q24H  . folic acid  1 mg Oral Daily  . LORazepam  1 mg Intravenous Once  . multivitamin with minerals  1 tablet Oral Daily  . nicotine  21 mg Transdermal Daily  . pantoprazole (PROTONIX) IV  40 mg Intravenous Q24H  . thiamine  100 mg Oral Daily   Or  . thiamine  100 mg Intravenous Daily   Continuous Infusions: . sodium chloride     PRN Meds:.acetaminophen **OR** acetaminophen, LORazepam, ondansetron **OR** ondansetron (ZOFRAN) IV  DVT Prophylaxis  Lovenox -   Lab Results  Component Value Date   PLT 214 06/18/2015    Antibiotics    Anti-infectives    None          Objective:   Filed Vitals:   06/18/15 0800 06/18/15 1000 06/18/15 1130 06/18/15 1200  BP: 154/97 153/102  156/86  Pulse: 98 102  103  Temp:   98.9 F (37.2 C)   TempSrc:      Resp: Height:      Weight:      SpO2: 94% 94%  95%    Wt Readings from Last 3  Encounters:  06/17/15 65.8 kg (145 lb 1 oz)     Intake/Output Summary (Last 24 hours) at 06/18/15 1229 Last data filed at 06/18/15 1143  Gross per 24 hour  Intake   2400 ml  Output   3535 ml  Net  -1135 ml     Physical Exam   awake, answering questions appropriately. No new F.N deficits,  [place].AT,PERRAL Supple Neck,No JVD, No cervical lymphadenopathy appriciated.  Symmetrical Chest wall movement, Good air movement bilaterally, CTAB RRR,No Gallops,Rubs or new Murmurs, No Parasternal Heave +ve B.Sounds, Abd Soft, No tenderness, No organomegaly appriciated, No rebound - guarding or rigidity. No Cyanosis, Clubbing or edema, No new Rash or bruise     Data Review   Micro Results Recent Results (from the past 240 hour(s))  MRSA PCR Screening      Status: None   Collection Time: 06/17/15  6:21 AM  Result Value Ref Range Status   MRSA by PCR NEGATIVE NEGATIVE Final    Comment:        The GeneXpert MRSA Assay (FDA approved for NASAL specimens only), is one component of a comprehensive MRSA colonization surveillance program. It is not intended to diagnose MRSA infection nor to guide or monitor treatment for MRSA infections.     Radiology Reports US Abdomen Complete  06/17/2015   CLINICAL DATA:  Elevated liver function test  EXAM: ULTRASOUND ABDOMEN COMPLETE  COMPARISON:  None.  FINDINGS: Gallbladder: Status post prior cholecystectomy  Common bile duct: Diameter: 5.5 mm  Liver: No focal lesion identified. There is mild diffuse increased echotexture of the liver.  IVC: No abnormality visualized.  Pancreas: Visualized portion unremarkable.  Spleen: Size and appearance within normal limits.  Right Kidney: Length: 11.9 cm. Echogenicity within normal limits. No mass or hydronephrosis visualized.  Left Kidney: Length: 11 cm. Echogenicity within normal limits. No mass or hydronephrosis visualized.  Abdominal aorta: No aneurysm visualized.  Distal aorta is not seen.  Other findings: None.  IMPRESSION: Mild diffuse increased echotexture of the liver, nonspecific, but can be seen in fatty infiltration of liver.   Electronically Signed   By: Sherian Rein M.D.   On: 06/17/2015 16:35   Dg Chest Port 1 View  06/17/2015   CLINICAL DATA:  Benadryl overdose.  Hypertension.  EXAM: PORTABLE CHEST - 1 VIEW  COMPARISON:  None.  FINDINGS: Mediastinum and hilar structures normal. Mild left base subsegmental atelectasis/infiltrate versus prominent epicardial fat pad. Lungs are otherwise clear. No pleural effusion or pneumothorax. Heart size normal. No acute bony abnormality .  IMPRESSION: Mild left base subsegmental atelectasis/ infiltrate versus prominent epicardial fat pad. Chest is otherwise negative.   Electronically Signed   By: Maisie Fus  Register   On:  06/17/2015 07:42   Dg Abd Portable 1v  06/17/2015   CLINICAL DATA:  Epigastric pain.  EXAM: PORTABLE ABDOMEN - 1 VIEW  COMPARISON:  None.  FINDINGS: Nonobstructive bowel gas pattern. Prior cholecystectomy. No free air. No organomegaly or suspicious calcification.  IMPRESSION: No acute findings.   Electronically Signed   By: Charlett Nose M.D.   On: 06/17/2015 07:11     CBC  Recent Labs Lab 06/17/15 0255 06/17/15 0637 06/18/15 0355  WBC 6.6 7.9 7.5  HGB 15.0 14.5 13.6  HCT 42.5 41.8 42.3  PLT 242 245 214  MCV 99.3 101.0* 103.4*  MCH 35.0* 35.0* 33.3  MCHC 35.3 34.7 32.2  RDW 11.7 11.8 11.6  LYMPHSABS 2.7 2.3  --   MONOABS 0.6 0.7  --  EOSABS 0.1 0.1  --   BASOSABS 0.0 0.0  --     Chemistries   Recent Labs Lab 06/17/15 0255 06/17/15 0637 06/18/15 0355  NA 141 139 138  K 2.5* 3.4* 5.1  CL 97* 101 108  CO2 27 28 21*  GLUCOSE 126* 131* 106*  BUN <5* <5* <5*  CREATININE 0.58 0.47 0.51  CALCIUM 9.2 8.8* 8.9  MG  --  1.9 1.9  AST 100* 83* 49*  ALT 41 37 24  ALKPHOS 159* 153* 146*  BILITOT 0.6 1.3* 1.8*   ------------------------------------------------------------------------------------------------------------------ estimated creatinine clearance is 72.3 mL/min (by C-G formula based on Cr of 0.51). ------------------------------------------------------------------------------------------------------------------ No results for input(s): HGBA1C in the last 72 hours. ------------------------------------------------------------------------------------------------------------------ No results for input(s): CHOL, HDL, LDLCALC, TRIG, CHOLHDL, LDLDIRECT in the last 72 hours. ------------------------------------------------------------------------------------------------------------------ No results for input(s): TSH, T4TOTAL, T3FREE, THYROIDAB in the last 72 hours.  Invalid input(s):  FREET3 ------------------------------------------------------------------------------------------------------------------ No results for input(s): VITAMINB12, FOLATE, FERRITIN, TIBC, IRON, RETICCTPCT in the last 72 hours.  Coagulation profile No results for input(s): INR, PROTIME in the last 168 hours.  No results for input(s): DDIMER in the last 72 hours.  Cardiac Enzymes  Recent Labs Lab 06/17/15 5643 06/17/15 1152 06/17/15 1810  TROPONINI <0.03 <0.03 <0.03   ------------------------------------------------------------------------------------------------------------------ Invalid input(s): POCBNP     Time Spent in minutes   30 minutes   Belmira Daley M.D on 06/18/2015 at 12:29 PM  Between 7am to 7pm - Pager - [phone]  After 7pm go to [email or web address] - password [facility]  [facility]   Office  [phone]
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
## 2015-06-19 DIAGNOSIS — T450X2S Poisoning by antiallergic and antiemetic drugs, intentional self-harm, sequela: Secondary | ICD-10-CM

## 2015-06-19 DIAGNOSIS — F1020 Alcohol dependence, uncomplicated: Secondary | ICD-10-CM | POA: Diagnosis not present

## 2015-06-19 DIAGNOSIS — T450X2A Poisoning by antiallergic and antiemetic drugs, intentional self-harm, initial encounter: Secondary | ICD-10-CM | POA: Diagnosis not present

## 2015-06-19 DIAGNOSIS — F329 Major depressive disorder, single episode, unspecified: Secondary | ICD-10-CM | POA: Diagnosis not present

## 2015-06-19 DIAGNOSIS — R45851 Suicidal ideations: Secondary | ICD-10-CM | POA: Diagnosis not present

## 2015-06-19 LAB — COMPREHENSIVE METABOLIC PANEL
ALT: 24 U/L (ref 14–54)
ANION GAP: 8 (ref 5–15)
AST: 46 U/L — ABNORMAL HIGH (ref 15–41)
Albumin: 3.1 g/dL — ABNORMAL LOW (ref 3.5–5.0)
Alkaline Phosphatase: 150 U/L — ABNORMAL HIGH (ref 38–126)
BUN: 7 mg/dL (ref 6–20)
CO2: 23 mmol/L (ref 22–32)
Calcium: 9 mg/dL (ref 8.9–10.3)
Chloride: 108 mmol/L (ref 101–111)
Creatinine, Ser: 0.44 mg/dL (ref 0.44–1.00)
GLUCOSE: 129 mg/dL — AB (ref 65–99)
POTASSIUM: 3.8 mmol/L (ref 3.5–5.1)
Sodium: 139 mmol/L (ref 135–145)
TOTAL PROTEIN: 6.2 g/dL — AB (ref 6.5–8.1)
Total Bilirubin: 0.9 mg/dL (ref 0.3–1.2)

## 2015-06-19 MED ORDER — SERTRALINE HCL 50 MG PO TABS
50.0000 mg | ORAL_TABLET | Freq: Every day | ORAL | Status: DC
  Administered 2015-06-19 – 2015-06-20 (×2): 50 mg via ORAL
  Filled 2015-06-19 (×2): qty 1

## 2015-06-19 MED ORDER — POLYETHYLENE GLYCOL 3350 17 G PO PACK
17.0000 g | PACK | Freq: Two times a day (BID) | ORAL | Status: AC
  Administered 2015-06-19 – 2015-06-20 (×4): 17 g via ORAL
  Filled 2015-06-19 (×4): qty 1

## 2015-06-19 MED ORDER — LORAZEPAM 2 MG/ML IJ SOLN: 1.0000 mg | Freq: Four times a day (QID) | INTRAMUSCULAR | Status: DC | PRN

## 2015-06-19 MED ORDER — LORAZEPAM 1 MG PO TABS
1.0000 mg | ORAL_TABLET | Freq: Four times a day (QID) | ORAL | Status: DC | PRN
  Administered 2015-06-21: 1 mg via ORAL
  Filled 2015-06-19: qty 1

## 2015-06-19 NOTE — Progress Notes (Signed)
Report called to [place] on [address].
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
## 2015-06-19 NOTE — Progress Notes (Signed)
Patient Demographics  Michelle Michael, is a 54 y.o. female, DOB - 1961-09-09, ZOX:[id number]  Admit date - 06/17/2015   Admitting Physician Eduard Clos, MD  Outpatient Primary MD for the patient is No PCP Per Patient  LOS - 2   Chief Complaint  Patient presents with  . Drug Overdose  . Hallucinations       Admission HPI/Brief narrative: 54 year old female with history of depression, hypertension resents with suicide attempt with ingestion of 24 pills of diphenhydramine, as well as endorses heavy alcohol Abuse, patient was admitted initially to step down.  Subjective:   Michelle Michael  denies any headache, chest pain, nausea, cough , and he denies any abdominal pain .   Assessment & Plan    Principal Problem:   Intentional diphenhydramine overdose Active Problems:   Suicidal ideation   Alcoholism   Hypokalemia   Diphenhydramine overdose   Substance induced mood disorder  Intentional diphenhydramine overdose - No significant events on telemetry, transfer from stepdown.  Depression with suicidal attempt -  Continue suicide precaution, psychiatry  consult appreciated. Started on Zoloft, will need psychiatric inpatient admission.  Hypokalemia - Resolved  Urinary retention - Patient had 650 mL post residual volume, this is most likely related to diphenhydramine overdose , Foley catheter DC'd 6/23.   Alcohol abuse - Continue with CIWA protocol,  acquired only 6 mg of Ativan over the last 24 hours, will transfer to telemetry floor, change to Med-Surg CIWA protocol  Elevated LFTs - Trending down, most likely related to alcohol liver disease, ultrasound fatty infiltration of the liver , acute hepatitis  panel was negative .  Complaining of abdominal pain - resolved - denies any abdominal pain, benign abdominal exam, normal abdominal x-ray, lipase within normal limit.  Code Status:  Full  Family Communication: None at bedside  Disposition Plan: remains in stepdown for next 24 hours to prevent DTs , will need inpatient psych admission when medically cleared    Procedures  None   Consults   Psychiatry   Medications  Scheduled Meds: . enoxaparin (LOVENOX) injection  40 mg Subcutaneous Q24H  . folic acid  1 mg Oral Daily  . LORazepam  1 mg Intravenous Once  . multivitamin with minerals  1 tablet Oral Daily  . nicotine  21 mg Transdermal Daily  . pantoprazole  40 mg Oral Daily  . polyethylene glycol  17 g Oral BID  . sertraline  25 mg Oral QHS  . thiamine  100 mg Oral Daily   Or  . thiamine  100 mg Intravenous Daily   Continuous Infusions: . sodium chloride 75 mL/hr at 06/18/15 1323   PRN Meds:.acetaminophen **OR** acetaminophen, LORazepam, ondansetron **OR** ondansetron (ZOFRAN) IV  DVT Prophylaxis  Lovenox -   Lab Results  Component Value Date   PLT 214 06/18/2015    Antibiotics    Anti-infectives    None          Objective:   Filed Vitals:   06/18/15 2000 06/19/15 0015 06/19/15 0400 06/19/15 0800  BP:      Pulse:      Temp: 98.2 F (36.8 C) 98.9 F (37.2 C) 98.1 F (36.7 C) 98.1 F (36.7 C)  TempSrc: Oral Oral Oral Oral  Resp:      Height:      Weight:      SpO2:        Wt Readings from Last 3 Encounters:  06/17/15 65.8 kg (145 lb 1 oz)     Intake/Output Summary (Last 24 hours) at 06/19/15 0927 Last data filed at 06/19/15 0919  Gross per 24 hour  Intake 1486.25 ml  Output   1475 ml  Net  11.25 ml     Physical Exam   awake, answering questions appropriately. No new F.N deficits,  [place].AT,PERRAL Supple Neck,No JVD, No cervical lymphadenopathy appriciated.  Symmetrical Chest wall movement, Good air movement bilaterally, CTAB RRR,No Gallops,Rubs or new Murmurs, No Parasternal Heave +ve B.Sounds, Abd Soft, No tenderness, No organomegaly appriciated, No rebound - guarding or rigidity. No Cyanosis, Clubbing or  edema, No new Rash or bruise     Data Review   Micro Results Recent Results (from the past 240 hour(s))  MRSA PCR Screening     Status: None   Collection Time: 06/17/15  6:21 AM  Result Value Ref Range Status   MRSA by PCR NEGATIVE NEGATIVE Final    Comment:        The GeneXpert MRSA Assay (FDA approved for NASAL specimens only), is one component of a comprehensive MRSA colonization surveillance program. It is not intended to diagnose MRSA infection nor to guide or monitor treatment for MRSA infections.     Radiology Reports US Abdomen Complete  06/17/2015   CLINICAL DATA:  Elevated liver function test  EXAM: ULTRASOUND ABDOMEN COMPLETE  COMPARISON:  None.  FINDINGS: Gallbladder: Status post prior cholecystectomy  Common bile duct: Diameter: 5.5 mm  Liver: No focal lesion identified. There is mild diffuse increased echotexture of the liver.  IVC: No abnormality visualized.  Pancreas: Visualized portion unremarkable.  Spleen: Size and appearance within normal limits.  Right Kidney: Length: 11.9 cm. Echogenicity within normal limits. No mass or hydronephrosis visualized.  Left Kidney: Length: 11 cm. Echogenicity within normal limits. No mass or hydronephrosis visualized.  Abdominal aorta: No aneurysm visualized.  Distal aorta is not seen.  Other findings: None.  IMPRESSION: Mild diffuse increased echotexture of the liver, nonspecific, but can be seen in fatty infiltration of liver.   Electronically Signed   By: Sherian Rein M.D.   On: 06/17/2015 16:35   Dg Chest Port 1 View  06/17/2015   CLINICAL DATA:  Benadryl overdose.  Hypertension.  EXAM: PORTABLE CHEST - 1 VIEW  COMPARISON:  None.  FINDINGS: Mediastinum and hilar structures normal. Mild left base subsegmental atelectasis/infiltrate versus prominent epicardial fat pad. Lungs are otherwise clear. No pleural effusion or pneumothorax. Heart size normal. No acute bony abnormality .  IMPRESSION: Mild left base subsegmental atelectasis/  infiltrate versus prominent epicardial fat pad. Chest is otherwise negative.   Electronically Signed   By: Maisie Fus  Register   On: 06/17/2015 07:42   Dg Abd Portable 1v  06/17/2015   CLINICAL DATA:  Epigastric pain.  EXAM: PORTABLE ABDOMEN - 1 VIEW  COMPARISON:  None.  FINDINGS: Nonobstructive bowel gas pattern. Prior cholecystectomy. No free air. No organomegaly or suspicious calcification.  IMPRESSION: No acute findings.   Electronically Signed   By: Charlett Nose M.D.   On: 06/17/2015 07:11     CBC  Recent Labs Lab 06/17/15 0255 06/17/15 0637 06/18/15 0355  WBC 6.6 7.9 7.5  HGB 15.0 14.5 13.6  HCT 42.5 41.8 42.3  PLT 242 245 214  MCV 99.3 101.0* 103.4*  MCH 35.0* 35.0* 33.3  MCHC 35.3 34.7 32.2  RDW 11.7 11.8 11.6  LYMPHSABS 2.7 2.3  --   MONOABS 0.6 0.7  --   EOSABS 0.1 0.1  --   BASOSABS 0.0 0.0  --     Chemistries   Recent Labs Lab 06/17/15 0255 06/17/15 0637 06/18/15 0355 06/19/15 0339  NA 141 139 138 139  K 2.5* 3.4* 5.1 3.8  CL 97* 101 108 108  CO2 27 28 21* 23  GLUCOSE 126* 131* 106* 129*  BUN <5* <5* <5* 7  CREATININE 0.58 0.47 0.51 0.44  CALCIUM 9.2 8.8* 8.9 9.0  MG  --  1.9 1.9  --   AST 100* 83* 49* 46*  ALT 41 37 24 24  ALKPHOS 159* 153* 146* 150*  BILITOT 0.6 1.3* 1.8* 0.9   ------------------------------------------------------------------------------------------------------------------ estimated creatinine clearance is 72.3 mL/min (by C-G formula based on Cr of 0.44). ------------------------------------------------------------------------------------------------------------------ No results for input(s): HGBA1C in the last 72 hours. ------------------------------------------------------------------------------------------------------------------ No results for input(s): CHOL, HDL, LDLCALC, TRIG, CHOLHDL, LDLDIRECT in the last 72  hours. ------------------------------------------------------------------------------------------------------------------ No results for input(s): TSH, T4TOTAL, T3FREE, THYROIDAB in the last 72 hours.  Invalid input(s): FREET3 ------------------------------------------------------------------------------------------------------------------ No results for input(s): VITAMINB12, FOLATE, FERRITIN, TIBC, IRON, RETICCTPCT in the last 72 hours.  Coagulation profile No results for input(s): INR, PROTIME in the last 168 hours.  No results for input(s): DDIMER in the last 72 hours.  Cardiac Enzymes  Recent Labs Lab 06/17/15 9179 06/17/15 1152 06/17/15 1810  TROPONINI <0.03 <0.03 <0.03   ------------------------------------------------------------------------------------------------------------------ Invalid input(s): POCBNP     Time Spent in minutes   30 minutes   Danyele Smejkal M.D on 06/19/2015 at 9:27 AM  Between 7am to 7pm - Pager - [phone]  After 7pm go to [email or web address] - password [facility]  [facility]   Office  [phone]
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
## 2015-06-20 LAB — BASIC METABOLIC PANEL
ANION GAP: 8 (ref 5–15)
BUN: 7 mg/dL (ref 6–20)
CHLORIDE: 105 mmol/L (ref 101–111)
CO2: 23 mmol/L (ref 22–32)
Calcium: 8.7 mg/dL — ABNORMAL LOW (ref 8.9–10.3)
Creatinine, Ser: 0.42 mg/dL — ABNORMAL LOW (ref 0.44–1.00)
GFR calc non Af Amer: >60 mL/min (ref >60)
Glucose, Bld: 141 mg/dL — ABNORMAL HIGH (ref 65–99)
Potassium: 3.6 mmol/L (ref 3.5–5.1)
Sodium: 136 mmol/L (ref 135–145)

## 2015-06-20 MED ORDER — CHLORDIAZEPOXIDE HCL 25 MG PO CAPS
25.0000 mg | ORAL_CAPSULE | Freq: Three times a day (TID) | ORAL | Status: DC
  Administered 2015-06-20: 25 mg via ORAL
  Filled 2015-06-20: qty 1

## 2015-06-20 MED ORDER — CHLORDIAZEPOXIDE HCL 5 MG PO CAPS
10.0000 mg | ORAL_CAPSULE | Freq: Three times a day (TID) | ORAL | Status: DC
  Administered 2015-06-20 – 2015-06-21 (×2): 10 mg via ORAL
  Filled 2015-06-20 (×2): qty 2

## 2015-06-20 MED ORDER — SODIUM CHLORIDE 0.9 % IV BOLUS (SEPSIS)
500.0000 mL | Freq: Once | INTRAVENOUS | Status: AC
  Administered 2015-06-20: 500 mL via INTRAVENOUS

## 2015-06-20 NOTE — Progress Notes (Signed)
Patient Demographics  Michelle Michael, is a 54 y.o. female, DOB - March 02, 1961, [id number]  Admit date - 06/17/2015   Admitting Physician Eduard Clos, MD  Outpatient Primary MD for the patient is No PCP Per Patient  LOS - 3   Chief Complaint  Patient presents with  . Drug Overdose  . Hallucinations       Admission HPI/Brief narrative: 54 year old female with history of depression, hypertension resents with suicide attempt with ingestion of 24 pills of diphenhydramine, as well as endorses heavy alcohol Abuse, patient was admitted initially to step down.  Subjective:   Michelle Michael  denies any headache, chest pain, nausea, cough , and he denies any abdominal pain .   Assessment & Plan    Principal Problem:   Intentional diphenhydramine overdose Active Problems:   Suicidal ideation   Alcoholism   Hypokalemia   Diphenhydramine overdose   Substance induced mood disorder  Intentional diphenhydramine overdose - No significant events on telemetry, patient is medically cleared for discharge to inpatient psych.  Depression with suicidal attempt -  Continue suicide precaution, psychiatry  consult appreciated. Started on Zoloft, will need psychiatric inpatient admission.  Hypokalemia - Resolved  Urinary retention -  resolved , Patient had 650 mL post residual volume, this is most likely related to diphenhydramine overdose , Foley catheter DC'd 6/23.   Alcohol abuse - Continue with CIWA protocol,  no Ativan requirements over the last 24 hours , no evidence of DTs .  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  Complaining of abdominal pain - resolved - denies any abdominal pain, benign abdominal exam, normal abdominal x-ray, lipase within normal limit.  Code Status:  Full  Family Communication: None at bedside  Disposition Plan: medically clear for discharge to inpatient acute artery Unit ,  awaiting bed availability   Procedures  None   Consults   Psychiatry   Medications  Scheduled Meds: . enoxaparin (LOVENOX) injection  40 mg Subcutaneous Q24H  . folic acid  1 mg Oral Daily  . multivitamin with minerals  1 tablet Oral Daily  . nicotine  21 mg Transdermal Daily  . pantoprazole  40 mg Oral Daily  . polyethylene glycol  17 g Oral BID  . sertraline  50 mg Oral QHS  . thiamine  100 mg Oral Daily   Or  . thiamine  100 mg Intravenous Daily   Continuous Infusions: . sodium chloride 75 mL/hr at 06/19/15 2015   PRN Meds:.acetaminophen **OR** acetaminophen, LORazepam **OR** LORazepam, ondansetron **OR** ondansetron (ZOFRAN) IV  DVT Prophylaxis  Lovenox -   Lab Results  Component Value Date   PLT 214 06/18/2015    Antibiotics    Anti-infectives    None          Objective:   Filed Vitals:   06/19/15 1432 06/19/15 2028 06/20/15 0027 06/20/15 0549  BP: 138/86 160/91 150/79 157/97  Pulse: 109 107 99 104  Temp: 98.1 F (36.7 C) 98.1 F (36.7 C)  98.3 F (36.8 C)  TempSrc: Axillary Oral  Oral  Resp: Height:      Weight:  SpO2: 99% 100%  99%    Wt Readings from Last 3 Encounters:  06/17/15 65.8 kg (145 lb 1 oz)     Intake/Output Summary (Last 24 hours) at 06/20/15 1249 Last data filed at 06/20/15 0700  Gross per 24 hour  Intake   2115 ml  Output      0 ml  Net   2115 ml     Physical Exam   awake, answering questions appropriately. No new F.N deficits,  [place].AT,PERRAL Supple Neck,No JVD, No cervical lymphadenopathy appriciated.  Symmetrical Chest wall movement, Good air movement bilaterally, CTAB RRR,No Gallops,Rubs or new Murmurs, No Parasternal Heave +ve B.Sounds, Abd Soft, No tenderness, No organomegaly appriciated, No rebound - guarding or rigidity. No Cyanosis, Clubbing or edema, No new  Rash or bruise     Data Review   Micro Results Recent Results (from the past 240 hour(s))  MRSA PCR Screening     Status: None   Collection Time: 06/17/15  6:21 AM  Result Value Ref Range Status   MRSA by PCR NEGATIVE NEGATIVE Final    Comment:        The GeneXpert MRSA Assay (FDA approved for NASAL specimens only), is one component of a comprehensive MRSA colonization surveillance program. It is not intended to diagnose MRSA infection nor to guide or monitor treatment for MRSA infections.     Radiology Reports US Abdomen Complete  06/17/2015   CLINICAL DATA:  Elevated liver function test  EXAM: ULTRASOUND ABDOMEN COMPLETE  COMPARISON:  None.  FINDINGS: Gallbladder: Status post prior cholecystectomy  Common bile duct: Diameter: 5.5 mm  Liver: No focal lesion identified. There is mild diffuse increased echotexture of the liver.  IVC: No abnormality visualized.  Pancreas: Visualized portion unremarkable.  Spleen: Size and appearance within normal limits.  Right Kidney: Length: 11.9 cm. Echogenicity within normal limits. No mass or hydronephrosis visualized.  Left Kidney: Length: 11 cm. Echogenicity within normal limits. No mass or hydronephrosis visualized.  Abdominal aorta: No aneurysm visualized.  Distal aorta is not seen.  Other findings: None.  IMPRESSION: Mild diffuse increased echotexture of the liver, nonspecific, but can be seen in fatty infiltration of liver.   Electronically Signed   By: Sherian Rein M.D.   On: 06/17/2015 16:35   Dg Chest Port 1 View  06/17/2015   CLINICAL DATA:  Benadryl overdose.  Hypertension.  EXAM: PORTABLE CHEST - 1 VIEW  COMPARISON:  None.  FINDINGS: Mediastinum and hilar structures normal. Mild left base subsegmental atelectasis/infiltrate versus prominent epicardial fat pad. Lungs are otherwise clear. No pleural effusion or pneumothorax. Heart size normal. No acute bony abnormality .  IMPRESSION: Mild left base subsegmental atelectasis/ infiltrate  versus prominent epicardial fat pad. Chest is otherwise negative.   Electronically Signed   By: Maisie Fus  Register   On: 06/17/2015 07:42   Dg Abd Portable 1v  06/17/2015   CLINICAL DATA:  Epigastric pain.  EXAM: PORTABLE ABDOMEN - 1 VIEW  COMPARISON:  None.  FINDINGS: Nonobstructive bowel gas pattern. Prior cholecystectomy. No free air. No organomegaly or suspicious calcification.  IMPRESSION: No acute findings.   Electronically Signed   By: Charlett Nose M.D.   On: 06/17/2015 07:11     CBC  Recent Labs Lab 06/17/15 0255 06/17/15 0637 06/18/15 0355  WBC 6.6 7.9 7.5  HGB 15.0 14.5 13.6  HCT 42.5 41.8 42.3  PLT 242 245 214  MCV 99.3 101.0* 103.4*  MCH 35.0* 35.0* 33.3  MCHC 35.3 34.7 32.2  RDW  11.7 11.8 11.6  LYMPHSABS 2.7 2.3  --   MONOABS 0.6 0.7  --   EOSABS 0.1 0.1  --   BASOSABS 0.0 0.0  --     Chemistries   Recent Labs Lab 06/17/15 0255 06/17/15 0637 06/18/15 0355 06/19/15 0339 06/20/15 0420  NA 141 139 138 139 136  K 2.5* 3.4* 5.1 3.8 3.6  CL 97* 101 108 108 105  CO2 27 28 21* 23 23  GLUCOSE 126* 131* 106* 129* 141*  BUN <5* <5* <5* 7 7  CREATININE 0.58 0.47 0.51 0.44 0.42*  CALCIUM 9.2 8.8* 8.9 9.0 8.7*  MG  --  1.9 1.9  --   --   AST 100* 83* 49* 46*  --   ALT 41 37 24 24  --   ALKPHOS 159* 153* 146* 150*  --   BILITOT 0.6 1.3* 1.8* 0.9  --    ------------------------------------------------------------------------------------------------------------------ estimated creatinine clearance is 72.3 mL/min (by C-G formula based on Cr of 0.42). ------------------------------------------------------------------------------------------------------------------ No results for input(s): HGBA1C in the last 72 hours. ------------------------------------------------------------------------------------------------------------------ No results for input(s): CHOL, HDL, LDLCALC, TRIG, CHOLHDL, LDLDIRECT in the last 72  hours. ------------------------------------------------------------------------------------------------------------------ No results for input(s): TSH, T4TOTAL, T3FREE, THYROIDAB in the last 72 hours.  Invalid input(s): FREET3 ------------------------------------------------------------------------------------------------------------------ No results for input(s): VITAMINB12, FOLATE, FERRITIN, TIBC, IRON, RETICCTPCT in the last 72 hours.  Coagulation profile No results for input(s): INR, PROTIME in the last 168 hours.  No results for input(s): DDIMER in the last 72 hours.  Cardiac Enzymes  Recent Labs Lab 06/17/15 2585 06/17/15 1152 06/17/15 1810  TROPONINI <0.03 <0.03 <0.03   ------------------------------------------------------------------------------------------------------------------ Invalid input(s): POCBNP     Time Spent in minutes   20 minutes   Bravery Ketcham M.D on 06/20/2015 at 12:49 PM  Between 7am to 7pm - Pager - [phone]  After 7pm go to [email or web address] - password [facility]  [facility]   Office  [phone]

## 2015-06-20 NOTE — Clinical Social Work Placement (Addendum)
   CLINICAL SOCIAL WORK PLACEMENT  NOTE  Date:  06/20/2015  Patient Details  Name: Michelle Michael MRN: [id number] Date of Birth: 01-29-1961  Clinical Social Work is seeking post-discharge placement for this patient at the   level of care (*CSW will initial, date and re-position this form in  chart as items are completed):      Patient/family provided with [facility] Clinical Social Work Department's list of facilities offering this level of care within the geographic area requested by the patient (or if unable, by the patient's family).      Patient/family informed of their freedom to choose among providers that offer the needed level of care, that participate in Medicare, Medicaid or managed care program needed by the patient, have an available bed and are willing to accept the patient.      Patient/family informed of [facility]'s ownership interest in [facility] and [facility], as well as of the fact that they are under no obligation to receive care at these facilities.  PASRR submitted to EDS on       PASRR number received on       Existing PASRR number confirmed on       FL2 transmitted to all facilities in geographic area requested by pt/family on       FL2 transmitted to all facilities within larger geographic area on       Patient informed that his/her managed care company has contracts with or will negotiate with certain facilities, including the following:            Patient/family informed of bed offers received.  Patient chooses bed at  [facility])     Physician recommends and patient chooses bed at      Patient to be transferred to  [facility]) on .June 21, 2015  Patient to be transferred to facility by  ([facility])     Patient family notified on  June 21, 2015  (Pt does not want anyone told) of transfer.  Name of family member notified:    Pt does not want anyone notified  PHYSICIAN       Additional Comment:     _______________________________________________ Annetta Maw, LCSW 06/20/2015, 3:21 PM

## 2015-06-20 NOTE — Clinical Social Work Note (Signed)
CSW called and spoke with [facility] to assess for pt bed  CSW provided information for pt and [facility] stated that they would call CSW back after they assess if they have a bed available today  CSW will continue to follow up with pt until discharge  .Elray Buba, LCSW [facility] Clinical Social Worker - Weekend Coverage cell #: [phone]

## 2015-06-20 NOTE — Clinical Social Work Note (Signed)
CSW followed up with [facility] about in patient bed today.  [facility] have stated that as long as pt's resting pulse comes down under the 100's she can be admitted today  CSW spoke with Md who stated that he would order Librium for pt and check on her in a couple of hours  CSW met with pt and had her sign intake paperwork for [facility] and faxed consent over to [facility]  If [facility] is not available when pt's pulse is steady please call [facility] at [phone] for pt transport to [facility]  .Dede Query, LCSW [facility] Clinical Social Worker - Weekend Coverage cell #: [phone]

## 2015-06-21 ENCOUNTER — Encounter (HOSPITAL_COMMUNITY): Payer: Self-pay | Admitting: *Deleted

## 2015-06-21 ENCOUNTER — Inpatient Hospital Stay (HOSPITAL_COMMUNITY)
Admission: AD | Admit: 2015-06-21 | Discharge: 2015-06-25 | DRG: 897 | Disposition: A | Discharge status: Home / Self Care | Payer: Federal, State, Local not specified - Other | Source: Intra-hospital | Attending: Psychiatry | Admitting: Psychiatry

## 2015-06-21 DIAGNOSIS — I10 Essential (primary) hypertension: Secondary | ICD-10-CM | POA: Diagnosis present

## 2015-06-21 DIAGNOSIS — F17210 Nicotine dependence, cigarettes, uncomplicated: Secondary | ICD-10-CM | POA: Diagnosis present

## 2015-06-21 DIAGNOSIS — F419 Anxiety disorder, unspecified: Secondary | ICD-10-CM | POA: Diagnosis present

## 2015-06-21 DIAGNOSIS — F1020 Alcohol dependence, uncomplicated: Principal | ICD-10-CM | POA: Diagnosis present

## 2015-06-21 DIAGNOSIS — R45851 Suicidal ideations: Secondary | ICD-10-CM | POA: Diagnosis present

## 2015-06-21 DIAGNOSIS — G4700 Insomnia, unspecified: Secondary | ICD-10-CM | POA: Diagnosis present

## 2015-06-21 DIAGNOSIS — F332 Major depressive disorder, recurrent severe without psychotic features: Secondary | ICD-10-CM | POA: Diagnosis present

## 2015-06-21 DIAGNOSIS — F1010 Alcohol abuse, uncomplicated: Secondary | ICD-10-CM | POA: Diagnosis present

## 2015-06-21 DIAGNOSIS — Z8249 Family history of ischemic heart disease and other diseases of the circulatory system: Secondary | ICD-10-CM

## 2015-06-21 MED ORDER — CHLORDIAZEPOXIDE HCL 25 MG PO CAPS
25.0000 mg | ORAL_CAPSULE | Freq: Four times a day (QID) | ORAL | Status: AC | PRN
  Filled 2015-06-21: qty 1

## 2015-06-21 MED ORDER — ALUM & MAG HYDROXIDE-SIMETH 200-200-20 MG/5ML PO SUSP: 30.0000 mL | ORAL | Status: DC | PRN

## 2015-06-21 MED ORDER — NICOTINE 21 MG/24HR TD PT24: 21.0000 mg | MEDICATED_PATCH | Freq: Every day | TRANSDERMAL | Status: DC

## 2015-06-21 MED ORDER — VITAMIN B-1 100 MG PO TABS
100.0000 mg | ORAL_TABLET | Freq: Every day | ORAL | Status: DC
  Administered 2015-06-22 – 2015-06-24 (×3): 100 mg via ORAL
  Filled 2015-06-21 (×5): qty 1

## 2015-06-21 MED ORDER — FOLIC ACID 1 MG PO TABS: 1.0000 mg | ORAL_TABLET | Freq: Every day | ORAL | Status: DC

## 2015-06-21 MED ORDER — CHLORDIAZEPOXIDE HCL 25 MG PO CAPS
25.0000 mg | ORAL_CAPSULE | Freq: Three times a day (TID) | ORAL | Status: AC
  Administered 2015-06-22 (×3): 25 mg via ORAL
  Filled 2015-06-21 (×2): qty 1

## 2015-06-21 MED ORDER — CHLORDIAZEPOXIDE HCL 25 MG PO CAPS
25.0000 mg | ORAL_CAPSULE | Freq: Every day | ORAL | Status: AC
  Administered 2015-06-24: 25 mg via ORAL
  Filled 2015-06-21: qty 1

## 2015-06-21 MED ORDER — SERTRALINE HCL 50 MG PO TABS: 50.0000 mg | ORAL_TABLET | Freq: Every day | ORAL | Status: DC

## 2015-06-21 MED ORDER — BUSPIRONE HCL 5 MG PO TABS
5.0000 mg | ORAL_TABLET | Freq: Two times a day (BID) | ORAL | Status: DC
  Administered 2015-06-21 – 2015-06-22 (×2): 5 mg via ORAL
  Filled 2015-06-21 (×6): qty 1

## 2015-06-21 MED ORDER — MAGNESIUM HYDROXIDE 400 MG/5ML PO SUSP: 30.0000 mL | Freq: Every day | ORAL | Status: DC | PRN

## 2015-06-21 MED ORDER — ACETAMINOPHEN 325 MG PO TABS: 650.0000 mg | ORAL_TABLET | Freq: Four times a day (QID) | ORAL | Status: DC | PRN

## 2015-06-21 MED ORDER — ONDANSETRON 4 MG PO TBDP
4.0000 mg | ORAL_TABLET | Freq: Four times a day (QID) | ORAL | Status: AC | PRN
  Administered 2015-06-22: 4 mg via ORAL
  Filled 2015-06-21: qty 1

## 2015-06-21 MED ORDER — TRAZODONE HCL 50 MG PO TABS
50.0000 mg | ORAL_TABLET | Freq: Every evening | ORAL | Status: DC | PRN
  Administered 2015-06-21 – 2015-06-24 (×4): 50 mg via ORAL
  Filled 2015-06-21 (×4): qty 1
  Filled 2015-06-21: qty 14

## 2015-06-21 MED ORDER — THIAMINE HCL 100 MG PO TABS: 100.0000 mg | ORAL_TABLET | Freq: Every day | ORAL | Status: DC

## 2015-06-21 MED ORDER — ADULT MULTIVITAMIN W/MINERALS CH: 1.0000 | ORAL_TABLET | Freq: Every day | ORAL | Status: DC

## 2015-06-21 MED ORDER — CHLORDIAZEPOXIDE HCL 10 MG PO CAPS: ORAL_CAPSULE | ORAL | Status: DC

## 2015-06-21 MED ORDER — ACETAMINOPHEN 325 MG PO TABS
650.0000 mg | ORAL_TABLET | Freq: Four times a day (QID) | ORAL | Status: DC | PRN
  Administered 2015-06-23 – 2015-06-25 (×4): 650 mg via ORAL
  Filled 2015-06-21 (×4): qty 2

## 2015-06-21 MED ORDER — LOPERAMIDE HCL 2 MG PO CAPS: 2.0000 mg | ORAL_CAPSULE | ORAL | Status: AC | PRN

## 2015-06-21 MED ORDER — HYDROXYZINE HCL 25 MG PO TABS: 25.0000 mg | ORAL_TABLET | Freq: Four times a day (QID) | ORAL | Status: AC | PRN

## 2015-06-21 MED ORDER — CHLORDIAZEPOXIDE HCL 25 MG PO CAPS
25.0000 mg | ORAL_CAPSULE | ORAL | Status: AC
  Administered 2015-06-23 (×2): 25 mg via ORAL
  Filled 2015-06-21 (×2): qty 1

## 2015-06-21 MED ORDER — CHLORDIAZEPOXIDE HCL 25 MG PO CAPS
25.0000 mg | ORAL_CAPSULE | Freq: Four times a day (QID) | ORAL | Status: AC
  Administered 2015-06-21 (×3): 25 mg via ORAL
  Filled 2015-06-21 (×2): qty 1

## 2015-06-21 MED ORDER — SERTRALINE HCL 50 MG PO TABS
50.0000 mg | ORAL_TABLET | Freq: Every day | ORAL | Status: DC
  Administered 2015-06-21 – 2015-06-24 (×4): 50 mg via ORAL
  Filled 2015-06-21 (×3): qty 1
  Filled 2015-06-21: qty 14
  Filled 2015-06-21 (×3): qty 1
  Filled 2015-06-21: qty 14

## 2015-06-21 MED ORDER — THIAMINE HCL 100 MG/ML IJ SOLN: 100.0000 mg | Freq: Once | INTRAMUSCULAR | Status: DC

## 2015-06-21 MED ORDER — ADULT MULTIVITAMIN W/MINERALS CH
1.0000 | ORAL_TABLET | Freq: Every day | ORAL | Status: DC
  Administered 2015-06-21 – 2015-06-24 (×4): 1 via ORAL
  Filled 2015-06-21: qty 14
  Filled 2015-06-21 (×5): qty 1
  Filled 2015-06-21: qty 14
  Filled 2015-06-21: qty 1

## 2015-06-21 NOTE — Discharge Instructions (Signed)
Follow with Primary MD  in 7 days  ° °Get CBC, CMP, 2 view Chest X ray checked  by Primary MD next visit.  ° ° °Activity: As tolerated with Full fall precautions use walker/cane & assistance as needed ° ° °Disposition Home  ° ° °Diet: Heart Healthy  , with feeding assistance and aspiration precautions. ° °For Heart failure patients - Check your Weight same time everyday, if you gain over 2 pounds, or you develop in leg swelling, experience more shortness of breath or chest pain, call your Primary MD immediately. Follow Cardiac Low Salt Diet and 1.5 lit/day fluid restriction. ° ° °On your next visit with your primary care physician please Get Medicines reviewed and adjusted. ° ° °Please request your Prim.MD to go over all Hospital Tests and Procedure/Radiological results at the follow up, please get all Hospital records sent to your Prim MD by signing hospital release before you go home. ° ° °If you experience worsening of your admission symptoms, develop shortness of breath, life threatening emergency, suicidal or homicidal thoughts you must seek medical attention immediately by calling 911 or calling your MD immediately  if symptoms less severe. ° °You Must read complete instructions/literature along with all the possible adverse reactions/side effects for all the Medicines you take and that have been prescribed to you. Take any new Medicines after you have completely understood and accpet all the possible adverse reactions/side effects.  ° °Do not drive, operating heavy machinery, perform activities at heights, swimming or participation in water activities or provide baby sitting services if your were admitted for syncope or siezures until you have seen by Primary MD or a Neurologist and advised to do so again. ° °Do not drive when taking Pain medications.  ° ° °Do not take more than prescribed Pain, Sleep and Anxiety Medications ° °Special Instructions: If you have smoked or chewed Tobacco  in the last 2 yrs  please stop smoking, stop any regular Alcohol  and or any Recreational drug use. ° °Wear Seat belts while driving. ° ° °Please note ° °You were cared for by a hospitalist during your hospital stay. If you have any questions about your discharge medications or the care you received while you were in the hospital after you are discharged, you can call the unit and asked to speak with the hospitalist on call if the hospitalist that took care of you is not available. Once you are discharged, your primary care physician will handle any further medical issues. Please note that NO REFILLS for any discharge medications will be authorized once you are discharged, as it is imperative that you return to your primary care physician (or establish a relationship with a primary care physician if you do not have one) for your aftercare needs so that they can reassess your need for medications and monitor your lab values. ° °

## 2015-06-21 NOTE — Tx Team (Signed)
Initial Interdisciplinary Treatment Plan   PATIENT STRESSORS: Educational concerns Medication change or noncompliance Occupational concerns Substance abuse   PATIENT STRENGTHS: Average or above average intelligence Capable of independent living Communication skills   PROBLEM LIST: Problem List/Patient Goals Date to be addressed Date deferred Reason deferred Estimated date of resolution  Alcohol abuse 06/21/2015     Chronic relapse 06/21/2015     Homelessness (lives in a hotel in [place]) 06/21/2015     Limited support system 06/21/2015                                    DISCHARGE CRITERIA:  Improved stabilization in mood, thinking, and/or behavior Need for constant or close observation no longer present Withdrawal symptoms are absent or subacute and managed without 24-hour nursing intervention  PRELIMINARY DISCHARGE PLAN: Attend 12-step recovery group Placement in alternative living arrangements  PATIENT/FAMIILY INVOLVEMENT: This treatment plan has been presented to and reviewed with the patient, Michelle Michael.  The patient and family have been given the opportunity to ask questions and make suggestions.  [place] Mon 06/21/2015, 12:11 PM

## 2015-06-21 NOTE — Progress Notes (Signed)
Report given to Garen Lah, RN at [facility]. Pt will be transported via [facility] transport with sitter.

## 2015-06-21 NOTE — BHH Suicide Risk Assessment (Signed)
[facility] Admission Suicide Risk Assessment   Nursing information obtained from:    Demographic factors:    Current Mental Status:    Loss Factors:    Historical Factors:    Risk Reduction Factors:    Total Time spent with patient: 1.5 hours Principal Problem: ETOH abuse Diagnosis:   Patient Active Problem List   Diagnosis Date Noted  . ETOH abuse [F10.10] 06/21/2015  . Intentional diphenhydramine overdose [T45.0X2A] 06/17/2015  . Suicidal ideation [R45.851] 06/17/2015  . Alcoholism [F10.20] 06/17/2015  . Hypokalemia [E87.6] 06/17/2015  . Diphenhydramine overdose [T45.0X1A] 06/17/2015  . Substance induced mood disorder [F19.94] 06/17/2015     Continued Clinical Symptoms:  Alcohol Use Disorder Identification Test Final Score (AUDIT): 34 The "Alcohol Use Disorders Identification Test", Guidelines for Use in Primary Care, Second Edition.  World Science writer [facility]). Score between 0-7:  no or low risk or alcohol related problems. Score between 8-15:  moderate risk of alcohol related problems. Score between 16-19:  high risk of alcohol related problems. Score 20 or above:  warrants further diagnostic evaluation for alcohol dependence and treatment.   CLINICAL FACTORS:   Depression:   Anhedonia Comorbid alcohol abuse/dependence Hopelessness Impulsivity Insomnia Dysthymia Alcohol/Substance Abuse/Dependencies More than one psychiatric diagnosis Unstable or Poor Therapeutic Relationship Previous Psychiatric Diagnoses and Treatments   Musculoskeletal: Strength & Muscle Tone: within normal limits Gait & Station: normal Patient leans: N/A  Psychiatric Specialty Exam: Physical Exam  Constitutional: She appears well-developed and well-nourished.  HENT:  Head: Normocephalic.    Review of Systems  Constitutional: Negative for fever.  Cardiovascular: Negative for chest pain.  Skin: Negative for rash.  Neurological: Positive for weakness.  Psychiatric/Behavioral: Positive  for depression, suicidal ideas and substance abuse. The patient is nervous/anxious.     Blood pressure 127/76, pulse 101, temperature 98 F (36.7 C), temperature source Oral, resp. rate 18, height 5\' 4"  (1.626 m), weight 66.225 kg (146 lb), SpO2 99 %.Body mass index is 25.05 kg/(m^2).  General Appearance: Casual  Eye Contact::  Fair  Speech:  Slow  Volume:  Decreased  Mood:  Dysphoric  Affect:  Constricted  Thought Process:  Coherent  Orientation:  Full (Time, Place, and Person)  Thought Content:  Rumination  Suicidal Thoughts:  Yes.  without intent/plan. Denies now  Homicidal Thoughts:  No  Memory:  Immediate;   Fair Recent;   Fair  Judgement:  Poor  Insight:  Shallow  Psychomotor Activity:  Decreased  Concentration:  Fair  Recall:  [employer] of Knowledge:Fair  Language: Fair  Akathisia:  Negative  Handed:  Right  AIMS (if indicated):     Assets:  Desire for Improvement Vocational/Educational  Sleep:     Cognition: WNL  ADL's:  Intact     COGNITIVE FEATURES THAT CONTRIBUTE TO RISK:  Closed-mindedness and Polarized thinking    SUICIDE RISK:   Moderate:  Frequent suicidal ideation with limited intensity, and duration, some specificity in terms of plans, no associated intent, good self-control, limited dysphoria/symptomatology, some risk factors present, and identifiable protective factors, including available and accessible social support.  PLAN OF CARE: Admit for stabilization. Detox off Alcohol. Medications for depression. Support groups.  Medical Decision Making:  Review of Psycho-Social Stressors (1), Review or order clinical lab tests (1), Review of Last Therapy Session (1) and Review of Medication Regimen & Side Effects (2)  I certify that inpatient services furnished can reasonably be expected to improve the patient's condition.   Michelle Michael 06/21/2015, 5:42 PM

## 2015-06-21 NOTE — Discharge Summary (Signed)
[address] Michelle Michael, is a 54 y.o. female  DOB January 29, 1961  MRN [id number].  Admission date:  06/17/2015  Admitting Physician  Eduard Clos, MD  Discharge Date:  06/21/2015   Primary MD  No PCP Per Patient  Recommendations for primary care physician for things to follow:  - Check basic labs including CBC, BMP Next visit   Admission Diagnosis  Hypokalemia [E87.6] Intentional diphenhydramine overdose, initial encounter [T45.0X2A]   Discharge Diagnosis  Hypokalemia [E87.6] Intentional diphenhydramine overdose, initial encounter [T45.0X2A]    Principal Problem:   Intentional diphenhydramine overdose Active Problems:   Suicidal ideation   Alcoholism   Hypokalemia   Diphenhydramine overdose   Substance induced mood disorder      Past Medical History  Diagnosis Date  . Depression   . Hypertension     Past Surgical History  Procedure Laterality Date  . Orthopedic surgery    . Cholecystectomy         History of present illness and  Hospital Course:     Kindly see H&P for history of present illness and admission details, please review complete Labs, Consult reports and Test reports for all details in brief  HPI  from the history and physical done on the day of admission on 6/22 by Dr Midge Minium   54 y.o. female with history of depression and hypertension present on no medications was brought to the ER after patient called EMS after taking 24 pills of diphenhydramine (strength exactly not known). Patient states she was depressed and suicidal. In the ER patient was found to be tremulous and tachycardic with mild confusion and poison control was contacted and was advised to give patient when necessary Ativan as needed based on patient's symptoms. Patient is also positive for alcohol and patient states she drinks alcohol everyday. Patient states she has been recently separated from her  husband and has been depressed. Denies overdosing with any other medications. Patient does take Goody powder everyday for headache. Salicylate levels are undetectable. Patient has been admitted for further management of drug overdose and suicidal.    Hospital Course   Intentional diphenhydramine overdose - No significant events on telemetry, patient is medically cleared for discharge to inpatient psych.  Depression with suicidal attempt - Continue suicide precaution, psychiatry consult appreciated. Started on Zoloft, for psychiatric inpatient admission.  Hypokalemia - Resolved  Urinary retention - resolved , Patient had 650 mL post residual volume, this is most likely related to diphenhydramine overdose , Foley catheter DC'd 6/23.  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  Complaining of abdominal pain - resolved - denies any abdominal pain, benign abdominal exam, normal abdominal x-ray, lipase within normal limit.    Discharge Condition:  stable   Follow UP      Discharge Instructions  and  Discharge Medications  Discharge Instructions    Discharge instructions    Complete by:  As directed   Follow with Primary MD No PCP Per Patient in 7 days   Get CBC, CMP, 2 view Chest X ray checked  by Primary MD next visit.    Activity: As tolerated with Full fall precautions use walker/cane & assistance as needed   Disposition [facility]   Diet: regular , with feeding assistance and aspiration precautions.  For Heart failure patients - Check your Weight same time everyday, if you gain over 2 pounds, or you develop in leg swelling, experience more shortness of breath or chest pain, call your Primary MD immediately. Follow Cardiac Low Salt Diet and 1.5  lit/day fluid restriction.   On your next visit with your primary care physician please Get Medicines reviewed and adjusted.   Please request your Prim.MD to go over all Hospital Tests and Procedure/Radiological results at the follow up, please get all Hospital records sent to your Prim MD by signing hospital release before you go home.   If you experience worsening of your admission symptoms, develop shortness of breath, life threatening emergency, suicidal or homicidal thoughts you must seek medical attention immediately by calling 911 or calling your MD immediately  if symptoms less severe.  You Must read complete instructions/literature along with all the possible adverse reactions/side effects for all the Medicines you take and that have been prescribed to you. Take any new Medicines after you have completely understood and accpet all the possible adverse reactions/side effects.   Do not drive, operating heavy machinery, perform activities at heights, swimming or participation in water activities or provide baby sitting services if your were admitted for syncope or siezures until you have seen by Primary MD or a Neurologist and advised to do so again.  Do not drive when taking Pain medications.    Do not take more than prescribed Pain, Sleep and Anxiety Medications  Special Instructions: If you have smoked or chewed Tobacco  in the last 2 yrs please stop smoking, stop any regular Alcohol  and or any Recreational drug use.  Wear Seat belts while driving.   Please note  You were cared for by a hospitalist during your hospital stay. If you have any questions about your discharge medications or the care you received while you were in the hospital after you are discharged, you can call the unit and asked to speak with the hospitalist on call if the hospitalist that took care of you is not available. Once you are discharged, your primary care physician will handle any further medical  issues. Please note that NO REFILLS for any discharge medications will be authorized once you are discharged, as it is imperative that you return to your primary care physician (or establish a relationship with a primary care physician if you do not have one) for your aftercare needs so that they can reassess your need for medications and monitor your lab values.     Increase activity slowly    Complete by:  As directed             Medication List    STOP taking these medications        diphenhydrAMINE 25 MG tablet  Commonly known as:  BENADRYL     GOODYS EXTRA STRENGTH 500-325-65 MG Pack  Generic drug:  Aspirin-Acetaminophen-Caffeine      TAKE these medications        acetaminophen 325 MG tablet  Commonly known as:  TYLENOL  Take 2 tablets (650 mg total) by mouth every 6 (six) hours as needed for mild pain (or Fever >/= 101).     chlordiazePOXIDE 10 MG capsule  Commonly known as:  LIBRIUM  Use 1 tablet oral 3 times daily for 3 days, then 1 tablet oral 2 times daily for 3 days, then 1 tablet oral daily for 3 days then stop.     folic acid 1 MG tablet  Commonly known as:  FOLVITE  Take 1 tablet (1 mg total) by mouth daily.     hydroxypropyl methylcellulose / hypromellose 2.5 % ophthalmic solution  Commonly known as:  ISOPTO TEARS / GONIOVISC  Place 1 drop into both eyes 3 (three) times daily as needed for dry eyes.     multivitamin with minerals Tabs tablet  Take 1 tablet by mouth daily.     nicotine 21 mg/24hr patch  Commonly known as:  NICODERM CQ - dosed in mg/24 hours  Place 1 patch (21 mg total) onto the skin daily.     sertraline 50 MG tablet  Commonly known as:  ZOLOFT  Take 1 tablet (50 mg total) by mouth at bedtime.     thiamine 100 MG tablet  Take 1 tablet (100 mg total) by mouth daily.          Diet and Activity recommendation: See Discharge Instructions above   Consults obtained -  Psychiatric   Major procedures and Radiology Reports - PLEASE  review detailed and final reports for all details, in brief -      US Abdomen Complete  06/17/2015   CLINICAL DATA:  Elevated liver function test  EXAM: ULTRASOUND ABDOMEN COMPLETE  COMPARISON:  None.  FINDINGS: Gallbladder: Status post prior cholecystectomy  Common bile duct: Diameter: 5.5 mm  Liver: No focal lesion identified. There is mild diffuse increased echotexture of the liver.  IVC: No abnormality visualized.  Pancreas: Visualized portion unremarkable.  Spleen: Size and appearance within normal limits.  Right Kidney: Length: 11.9 cm. Echogenicity within normal limits. No mass or hydronephrosis visualized.  Left Kidney: Length: 11 cm. Echogenicity within normal limits. No mass or hydronephrosis visualized.  Abdominal aorta: No aneurysm visualized.  Distal aorta is not seen.  Other findings: None.  IMPRESSION: Mild diffuse increased echotexture of the liver, nonspecific, but can be seen in fatty infiltration of liver.   Electronically Signed   By: Sherian Rein M.D.   On: 06/17/2015 16:35   Dg Chest Port 1 View  06/17/2015   CLINICAL DATA:  Benadryl overdose.  Hypertension.  EXAM: PORTABLE CHEST - 1 VIEW  COMPARISON:  None.  FINDINGS: Mediastinum and hilar structures normal. Mild left base subsegmental atelectasis/infiltrate versus prominent epicardial fat pad. Lungs are otherwise clear. No pleural effusion or pneumothorax. Heart size normal. No acute bony abnormality .  IMPRESSION: Mild left base subsegmental atelectasis/ infiltrate versus prominent epicardial fat pad. Chest is otherwise negative.   Electronically Signed   By: Maisie Fus  Register   On: 06/17/2015 07:42   Dg Abd Portable 1v  06/17/2015   CLINICAL DATA:  Epigastric pain.  EXAM: PORTABLE ABDOMEN - 1 VIEW  COMPARISON:  None.  FINDINGS: Nonobstructive bowel gas pattern. Prior cholecystectomy. No free air. No organomegaly or suspicious calcification.  IMPRESSION: No acute findings.   Electronically Signed   By: Charlett Nose M.D.   On:  06/17/2015 07:11    Micro Results     Recent Results (from the past 240 hour(s))  MRSA PCR Screening  Status: None   Collection Time: 06/17/15  6:21 AM  Result Value Ref Range Status   MRSA by PCR NEGATIVE NEGATIVE Final    Comment:        The GeneXpert MRSA Assay (FDA approved for NASAL specimens only), is one component of a comprehensive MRSA colonization surveillance program. It is not intended to diagnose MRSA infection nor to guide or monitor treatment for MRSA infections.        Today   Subjective:   Michelle Michael today has no headache,no chest abdominal pain,no new weakness tingling or numbness .  Objective:   Blood pressure 129/71, pulse 83, temperature 98 F (36.7 C), temperature source Oral, resp. rate 18, height 5\' 5"  (1.651 m), weight 65.8 kg (145 lb 1 oz), SpO2 99 %.   Intake/Output Summary (Last 24 hours) at 06/21/15 0948 Last data filed at 06/20/15 1500  Gross per 24 hour  Intake    600 ml  Output      0 ml  Net    600 ml    Exam Awake Alert, Oriented x 3, No new F.N deficits,  [place].AT,PERRAL Supple Neck,No JVD, No cervical lymphadenopathy appriciated.  Symmetrical Chest wall movement, Good air movement bilaterally, CTAB RRR,No Gallops,Rubs or new Murmurs, No Parasternal Heave +ve B.Sounds, Abd Soft, Non tender, No organomegaly appriciated, No rebound -guarding or rigidity. No Cyanosis, Clubbing or edema, No new Rash or bruise, no tremors  Data Review   CBC w Diff: Lab Results  Component Value Date   WBC 7.5 06/18/2015   HGB 13.6 06/18/2015   HCT 42.3 06/18/2015   PLT 214 06/18/2015   LYMPHOPCT 29 06/17/2015   MONOPCT 9 06/17/2015   EOSPCT 1 06/17/2015   BASOPCT 0 06/17/2015    CMP: Lab Results  Component Value Date   NA 136 06/20/2015   K 3.6 06/20/2015   CL 105 06/20/2015   CO2 23 06/20/2015   BUN 7 06/20/2015   CREATININE 0.42* 06/20/2015   PROT 6.2* 06/19/2015   ALBUMIN 3.1* 06/19/2015   BILITOT 0.9 06/19/2015    ALKPHOS 150* 06/19/2015   AST 46* 06/19/2015   ALT 24 06/19/2015  .   Total Time in preparing paper work, data evaluation and todays exam - 35 minutes  Michelle Michael M.D on 06/21/2015 at 9:48 AM  [facility]   Office  [phone]

## 2015-06-21 NOTE — Clinical Social Work Note (Signed)
CSW received a call from RN stating that pt's pulse was to high last night for [facility] transfer  Pt's RN stated that pt's pulse has been stable since midnight of last night and it is remaining in the 80's so pt is ready for transfer to [facility]  CSW caled [facility] to confirm that pt bed is still available and it was so RN was provided information and will call report  .Elray Buba, LCSW [facility] Clinical Social Worker - Weekend Coverage cell #: [phone]

## 2015-06-21 NOTE — Progress Notes (Signed)
Admission note: Patient is a 54 yo female admitted for alcohol abuse and overdose of 24 pills of diphenhydramine.  Patient called EMS after ingesting pills.  Patient stated she was suicidal. Patient was tachycardic with confused when admitted to medical floor.  Patient states she drinks alcohol daily approximately 12 pack of beer and a pint of vodka.  She states that states she was in rehab a couple of years ago.  This was at [facility] and she did not complete the program.  She remains passively suicidal and contracts for safety on the unit.  She is depressed and anxious.  She is currently living in a hotel in [place].  She states the bugs are so bad "I could hardly stand it."  Patient has little to no support system.  She denies any drug use.  Patient had a period of sobriety for 6 years and she did attend AA.  Her CIWA is currently a nine.  Requested orders for NP.  Oriented to room and unit.

## 2015-06-21 NOTE — Progress Notes (Signed)
Patient did attend the evening speaker AA meeting.  
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
## 2015-06-22 DIAGNOSIS — F1010 Alcohol abuse, uncomplicated: Secondary | ICD-10-CM

## 2015-06-22 DIAGNOSIS — F332 Major depressive disorder, recurrent severe without psychotic features: Secondary | ICD-10-CM

## 2015-06-22 LAB — TSH: TSH: 1.036 u[IU]/mL (ref 0.350–4.500)

## 2015-06-22 MED ORDER — BUSPIRONE HCL 10 MG PO TABS
10.0000 mg | ORAL_TABLET | Freq: Two times a day (BID) | ORAL | Status: DC
  Administered 2015-06-22 – 2015-06-23 (×2): 10 mg via ORAL
  Filled 2015-06-22 (×4): qty 1

## 2015-06-22 MED ORDER — NICOTINE 21 MG/24HR TD PT24
21.0000 mg | MEDICATED_PATCH | Freq: Every day | TRANSDERMAL | Status: DC
  Administered 2015-06-22 – 2015-06-24 (×3): 21 mg via TRANSDERMAL
  Filled 2015-06-22 (×2): qty 1
  Filled 2015-06-22: qty 14
  Filled 2015-06-22: qty 1
  Filled 2015-06-22: qty 14
  Filled 2015-06-22 (×2): qty 1

## 2015-06-22 NOTE — Plan of Care (Signed)
Problem: Ineffective individual coping Goal: STG: Patient will remain free from self harm Outcome: Progressing Patient remains free from self harm.      
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
## 2015-06-22 NOTE — [facility] Suicide Risk Assessment (Signed)
[facility] INPATIENT:  Family/Significant Other Suicide Prevention Education  Suicide Prevention Education:  Patient Refusal for Family/Significant Other Suicide Prevention Education: The patient Michelle Michael has refused to provide written consent for family/significant other to be provided Family/Significant Other Suicide Prevention Education during admission and/or prior to discharge.  Physician notified. SPE reviewed with patient and brochure provided. Patient encouraged to return to hospital if having suicidal thoughts, patient verbalized his/her understanding and has no further questions at this time.   Avril Busser, [place] L 06/22/2015, 12:44 PM
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
## 2015-06-22 NOTE — [facility] Group Notes (Signed)
   [facility] Note  06/22/2015  8:45 AM   Participation Quality: Alert, Appropriate and Oriented  Mood/Affect: Depressed and Flat  Depression Rating: 5  Anxiety Rating: 8  Thoughts of Suicide: Pt denies SI/HI  Will you contract for safety? Yes  Current AVH: Pt denies  Plan for Discharge/Comments: Pt attended discharge planning group and actively participated in group. CSW provided pt with today's workbook. Patient reports that she is homeless and is interested in residential treatment at discharge.  Transportation Means: Pt reports access to transportation  Supports: No supports mentioned at this time  Samuella BruinKristin Jermond Burkemper, MSW, [employer]CSWA Clinical Social Worker [facility] [phone]

## 2015-06-22 NOTE — Progress Notes (Signed)
Patient reported that she was feeling nauseated in the morning; but felt better after she was treated for that. She denied SI/HI  and denied hallucinations. She reported that she was still feeling shaky. Writer Encouraged and supported patient. She received her HS medication without any difficulty. Q 15 minute check continues as ordered for safety.

## 2015-06-22 NOTE — [facility] Group Notes (Signed)
[facility] LCSW Group Therapy 06/22/2015  1:15 pm  Type of Therapy: Group Therapy Participation Level: Active  Participation Quality: Attentive, Sharing and Supportive  Affect: Depressed and Flat  Cognitive: Alert and Oriented  Insight: Developing/Improving and Engaged  Engagement in Therapy: Developing/Improving and Engaged  Modes of Intervention: Clarification, Confrontation, Discussion, Education, Exploration,  Limit-setting, Orientation, Problem-solving, Rapport Building, Dance movement psychotherapisteality Testing, Socialization and Support  Summary of Progress/Problems: Pt identified obstacles faced currently and processed barriers involved in overcoming these obstacles. Pt identified steps necessary for overcoming these obstacles and explored motivation (internal and external) for facing these difficulties head on. Pt further identified one area of concern in their lives and chose a goal to focus on for today. Patient identified unhealthy relationships and difficulty asking for help as obstacles. CSW and other group members provided patient with emotional support and encouragement.  Samuella BruinKristin Nelissa Bolduc, MSW, [employer]CSWA Clinical Social Worker [facility] [phone]

## 2015-06-22 NOTE — Clinical Social Work Note (Signed)
Referral faxed to [facility] per patient request.  Samuella BruinKristin Ronetta Molla, MSW, [facility]WA Clinical Social Worker [facility] [phone]

## 2015-06-22 NOTE — Clinical Social Work Note (Signed)
Referral faxed to [facility] at patient's request.   Adger Cantera, MSW, LCSWA Clinical Social Worker [facility] [phone]  

## 2015-06-22 NOTE — Progress Notes (Signed)
Patient ID: Michelle Michael, female   DOB: 1961/08/21, 54 y.o.   MRN: [id number]  DAR: Pt. Denies SI/HI and A/V Hallucinations. Patient does not report any pain or discomfort at this time. Support and encouragement provided to the patient. Scheduled medications administered to patient per physician's orders. Patient received PRN Zofran for nausea and reported some relief. Patient was seen exiting morning Chaplain group and looked upset. Writer inquired and patient stated another patient was disrupting the group and she was not going back in. Writer inquired who the patient was but patient said, "I'm not a tattle-tail." She was seen a little bit later and reported she was feeling well. Patient is receptive and cooperative. Patient is seen in the milieu and is attending groups. Q15 minute checks are maintained for safety.

## 2015-06-22 NOTE — Progress Notes (Signed)
Recreation Therapy Notes  Date: 06.27.16 Time: 9:30 am Location: [address] Dayroom  Group Topic: Stress Management  Goal Area(s) Addresses:  Patient will verbalize importance of using healthy stress management.  Patient will identify positive emotions associated with healthy stress management.   Behavioral Response: Engaged  Intervention: Stress Management  Activity :  Guided Imagery Script.  LRT introduced and educated patients on stress management technique of guided imagery.  A script was used to to deliver the technique to patients.  Patients were asked to follow script read aloud by LRT to engage in the stress management technique.  Education:  Stress Management, Discharge Planning.   Education Outcome: Acknowledges edcuation/In group clarification offered/Needs additional education  Clinical Observations/Feedback: Patient attended group.  [name] , LRT/CTRS         Onix Jumper A 06/22/2015 5:02 PM 

## 2015-06-23 MED ORDER — BUSPIRONE HCL 5 MG PO TABS
15.0000 mg | ORAL_TABLET | Freq: Two times a day (BID) | ORAL | Status: DC
  Administered 2015-06-23 – 2015-06-24 (×3): 15 mg via ORAL
  Filled 2015-06-23: qty 1
  Filled 2015-06-23: qty 84
  Filled 2015-06-23 (×4): qty 1
  Filled 2015-06-23: qty 84
  Filled 2015-06-23: qty 1
  Filled 2015-06-23: qty 84

## 2015-06-23 NOTE — [facility] Group Notes (Addendum)
[facility] Group Therapy 06/23/2015 1:15pm  Type of Therapy: Mental Health Association Presentation  Participation Level: Active  Participation Quality: Attentive  Affect: Appropriate  Cognitive: Oriented  Insight: Developing/Improving  Engagement in Therapy: Engaged  Modes of Intervention: Discussion, Education and Socialization  Summary of Progress/Problems: Mental Health Association (MHA) Speaker came to talk about his personal journey with substance abuse and addiction. The pt processed ways by which to relate to the speaker. MHA speaker provided handouts and educational information pertaining to groups and services offered by the [facility]. Pt was engaged in speaker's presentation and was receptive to resources provided.    Chad CordialLauren Carter, LCSWA 06/23/2015 1:37 PM

## 2015-06-23 NOTE — Clinical Social Work Note (Signed)
Per [name], patient needs to verified through [employer] [place] and [place] Medicaid in any county - [place], [facility]  .  Per [name], [place] [phone], [name]   [name], LCSW Clinical Social Worker

## 2015-06-23 NOTE — Progress Notes (Signed)
D:Pt is pleasant and cooperative on the unit. Pt has a mild tremor and reports no anxiety in the morning. Pt is interacting with peers/staff on the unit and attending groups. A:Offered support, encouragement and 15 minute checks. R:Safety maintained on the unit.

## 2015-06-23 NOTE — Progress Notes (Signed)
[facility] MD Progress Note  06/23/2015 5:58 PM Michelle Michael  MRN:  [id number] Subjective:  Did not sleep as well last night just on the Trazodone. States she is just trying to go day by day. She has no clear idea where is she going from here and the uncertainty increases her anxiety. States she really needs to go to a residential treatment program. She is afraid of relapsing and for everything to start all over again Principal Problem: ETOH abuse Diagnosis:   Patient Active Problem List   Diagnosis Date Noted  . ETOH abuse [F10.10] 06/21/2015  . Major depressive disorder, recurrent, severe without psychotic features [F33.2]   . Alcohol use disorder, severe, dependence [F10.20]   . Intentional diphenhydramine overdose [T45.0X2A] 06/17/2015  . Suicidal ideation [R45.851] 06/17/2015  . Alcoholism [F10.20] 06/17/2015  . Hypokalemia [E87.6] 06/17/2015  . Diphenhydramine overdose [T45.0X1A] 06/17/2015  . Substance induced mood disorder [F19.94] 06/17/2015   Total Time spent with patient: 30 minutes   Past Medical History:  Past Medical History  Diagnosis Date  . Depression   . Hypertension     Past Surgical History  Procedure Laterality Date  . Orthopedic surgery    . Cholecystectomy     Family History:  Family History  Problem Relation Age of Onset  . Hypertension Mother   . Hypertension Father    Social History:  History  Alcohol Use  . Yes    Comment: "2 quartz of beer a day"     History  Drug Use No    History   Social History  . Marital Status: Legally Separated    Spouse Name: N/A  . Number of Children: N/A  . Years of Education: N/A   Social History Main Topics  . Smoking status: Current Every Day Smoker -- 1.00 packs/day    Types: Cigarettes  . Smokeless tobacco: Not on file  . Alcohol Use: Yes     Comment: "2 quartz of beer a day"  . Drug Use: No  . Sexual Activity: Not on file   Other Topics Concern  . None   Social History Narrative    Additional History:    Sleep: Poor  Appetite:  Fair   Assessment:   Musculoskeletal: Strength & Muscle Tone: within normal limits Gait & Station: normal Patient leans: normally   Psychiatric Specialty Exam: Physical Exam  Review of Systems  Constitutional: Negative.   HENT: Negative.   Eyes: Negative.   Respiratory: Negative.   Cardiovascular: Negative.   Gastrointestinal: Negative.   Genitourinary: Negative.   Musculoskeletal: Negative.   Skin: Negative.   Neurological: Negative.   Endo/Heme/Allergies: Negative.   Psychiatric/Behavioral: Positive for depression and substance abuse. The patient is nervous/anxious and has insomnia.     Blood pressure 122/68, pulse 91, temperature 98.6 F (37 C), temperature source Oral, resp. rate 16, height 5\' 4"  (1.626 m), weight 66.225 kg (146 lb), SpO2 99 %.Body mass index is 25.05 kg/(m^2).  General Appearance: Fairly Groomed  Patent attorney::  Fair  Speech:  Clear and Coherent  Volume:  Decreased  Mood:  Anxious and worried  Affect:  anxious worried  Thought Process:  Coherent and Goal Directed  Orientation:  Full (Time, Place, and Person)  Thought Content:  symptoms events worries concerns  Suicidal Thoughts:  No  Homicidal Thoughts:  No  Memory:  Immediate;   Fair Recent;   Fair Remote;   Fair  Judgement:  Fair  Insight:  Present  Psychomotor Activity:  Restlessness  Concentration:  Fair  Recall:  [employer]Fair  Fund of Knowledge:Fair  Language: Fair  Akathisia:  No  Handed:  Right  AIMS (if indicated):     Assets:  Desire for Improvement  ADL's:  Intact  Cognition: WNL  Sleep:        Current Medications: Current Facility-Administered Medications  Medication Dose Route Frequency Provider Last Rate Last Dose  . acetaminophen (TYLENOL) tablet 650 mg  650 mg Oral Q6H PRN Adonis BrookSheila Agustin, NP   650 mg at 06/23/15 1722  . alum & mag hydroxide-simeth (MAALOX/MYLANTA) 200-200-20 MG/5ML suspension 30 mL  30 mL Oral Q4H PRN  Adonis BrookSheila Agustin, NP      . busPIRone (BUSPAR) tablet 15 mg  15 mg Oral BID Rachael FeeIrving A Fallynn Gravett, MD   15 mg at 06/23/15 1723  . chlordiazePOXIDE (LIBRIUM) capsule 25 mg  25 mg Oral Q6H PRN Adonis BrookSheila Agustin, NP      . chlordiazePOXIDE (LIBRIUM) capsule 25 mg  25 mg Oral BH-qamhs Adonis BrookSheila Agustin, NP   25 mg at 06/23/15 0842   Followed by  . [START ON 06/24/2015] chlordiazePOXIDE (LIBRIUM) capsule 25 mg  25 mg Oral Daily Adonis BrookSheila Agustin, NP      . hydrOXYzine (ATARAX/VISTARIL) tablet 25 mg  25 mg Oral Q6H PRN Adonis BrookSheila Agustin, NP      . loperamide (IMODIUM) capsule 2-4 mg  2-4 mg Oral PRN Adonis BrookSheila Agustin, NP      . magnesium hydroxide (MILK OF MAGNESIA) suspension 30 mL  30 mL Oral Daily PRN Adonis BrookSheila Agustin, NP      . multivitamin with minerals tablet 1 tablet  1 tablet Oral Daily Adonis BrookSheila Agustin, NP   1 tablet at 06/23/15 [phone]  . nicotine (NICODERM CQ - dosed in mg/24 hours) patch 21 mg  21 mg Transdermal Daily Rachael FeeIrving A Janice Seales, MD   21 mg at 06/23/15 0840  . ondansetron (ZOFRAN-ODT) disintegrating tablet 4 mg  4 mg Oral Q6H PRN Adonis BrookSheila Agustin, NP   4 mg at 06/22/15 0848  . sertraline (ZOLOFT) tablet 50 mg  50 mg Oral Daily Adonis BrookSheila Agustin, NP   50 mg at 06/23/15 0841  . thiamine (B-1) injection 100 mg  100 mg Intramuscular Once Adonis BrookSheila Agustin, NP   100 mg at 06/21/15 1300  . thiamine (VITAMIN B-1) tablet 100 mg  100 mg Oral Daily Adonis BrookSheila Agustin, NP   100 mg at 06/23/15 0842  . traZODone (DESYREL) tablet 50 mg  50 mg Oral QHS PRN Rachael FeeIrving A Providencia Hottenstein, MD   50 mg at 06/22/15 2129    Lab Results:  Results for orders placed or performed during the hospital encounter of 06/21/15 (from the past 48 hour(s))  TSH     Status: None   Collection Time: 06/22/15  6:20 AM  Result Value Ref Range   TSH 1.036 0.350 - 4.500 uIU/mL    Comment: [facility]    Physical Findings: AIMS: Facial and Oral Movements Muscles of Facial Expression: None, normal Lips and Perioral Area: None, normal Jaw: None,  normal Tongue: None, normal,Extremity Movements Upper (arms, wrists, hands, fingers): None, normal Lower (legs, knees, ankles, toes): None, normal, Trunk Movements Neck, shoulders, hips: None, normal, Overall Severity Severity of abnormal movements (highest score from questions above): None, normal Incapacitation due to abnormal movements: None, normal Patient's awareness of abnormal movements (rate only patient's report): No Awareness, Dental Status Current problems with teeth and/or dentures?: No Does patient usually wear dentures?: No  CIWA:  CIWA-Ar Total: 1  COWS:     Treatment Plan Summary: Daily contact with patient to assess and evaluate symptoms and progress in treatment and Medication management Supportive approach/coping skills Alcohol dependence; continue alcohol detox protocol/work a relapse prevention plan Anxiety; will increase the Buspar to 15 mg BID Depression; will continue the Zoloft at 50 mg daily Will work with CBT/mindfluness Explore residential treatment options  Medical Decision Making:  Review of Psycho-Social Stressors (1), Review or order clinical lab tests (1), Review of Medication Regimen & Side Effects (2) and Review of New Medication or Change in Dosage (2)     Emiley Digiacomo A 06/23/2015, 5:58 PM

## 2015-06-23 NOTE — Progress Notes (Signed)
Pt was invited to attend the AA speaker meeting tonight, however pt stayed in her room and slept.

## 2015-06-23 NOTE — Progress Notes (Signed)
[facility] Group Notes:  (Nursing/MHT/Case Management/Adjunct)  Date:  06/23/2015  Time:  4:17 PM  Type of Therapy:  Nurse Education  Participation Level:  Active  Participation Quality:  Appropriate  Affect:  Appropriate  Cognitive:  Alert and Oriented  Insight:  Appropriate  Engagement in Group:  Engaged  Modes of Intervention:  Clarification, Discussion and Socialization  Summary of Progress/Problems: Pt's goal "to keep moving forward." Pt discussed her alcohol addiction and coping skills.  Beatrix ShipperWright, Ketzia Guzek Martin 06/23/2015, 4:17 PM

## 2015-06-23 NOTE — Tx Team (Signed)
Interdisciplinary Treatment Plan Update (Adult) Date: 06/23/2015   Date: 06/23/2015 1:02 PM  Progress in Treatment:  Attending groups: Yes  Participating in groups: Yes  Taking medication as prescribed: Yes  Tolerating medication: Yes  Family/Significant othe contact made: No, Pt declines family contact Patient understands diagnosis: Yes Discussing patient identified problems/goals with staff: Yes  Medical problems stabilized or resolved: Yes  Denies suicidal/homicidal ideation: Yes Patient has not harmed self or Others: Yes   New problem(s) identified: None identified at this time.   Discharge Plan or Barriers: Pt is requesting residential substance abuse treatment. Pt has been referred to [facility] and [facility]. Will also be [facility].  Additional comments:  Patient is a 54 year old female admitted following a suicide attempt by overdosing on Benadryl and ETOH. Patient with alcohol abuse and requesting referral for residential treatment. [place] [facility] [place] and denies any social supports.    Reason for Continuation of Hospitalization:  Depression Medication stabilization Suicidal ideation  Estimated length of stay: 3-5 days  For review of initial/current patient goals, please see plan of care.   Attendees:  Patient:    Family:    Physician: Dr. Dub MikesLugo MD  06/23/2015 9:42 AM  Nursing: Onnie BoerJennifer Clark, RN Case manager  06/23/2015 9:42 AM  Clinical Social Worker Lamar SprinklesLauren Carter, LCSWA, MSW 06/23/2015 9:42 AM  Other: Leisa LenzValerie Enoch, Vesta MixerMonarch Liasion 06/23/2015 9:42 AM  Clinical:  Keitha ButteAndrea Thorn, RN; Merian CapronMarian Friedman, RN; Waynetta SandyJan Wright, RN 06/23/2015 9:42 AM  Other: , RN Charge Nurse 06/23/2015 9:42 AM  Other:      Chad CordialLauren Carter, Theresia MajorsLCSWA MSW

## 2015-06-23 NOTE — Progress Notes (Signed)
Recreation Therapy Notes  Animal-Assisted Activity (AAA) Program Checklist/Progress Notes Patient Eligibility Criteria Checklist & Daily Group note for Rec Tx Intervention  Date: 06.28.16 Time: 2:45 pm Location: 400 Hall Dayroom  AAA/T Program Assumption of Risk Form signed by Patient/ or Parent Legal Guardian yes  Patient is free of allergies or sever asthma yes  Patient reports no fear of animals yes  Patient reports no history of cruelty to animalsyes  Patient understands his/her participation is voluntary yes  Patient washes hands before animal contact yes  Patient washes hands after animal contact yes  Education: Hand Washing, Appropriate Animal Interaction   Clinical Observations/Feedback: Patient did not attend group.   Michelle Michael, LRT/CTRS         Michelle Michael 06/23/2015 4:07 PM 

## 2015-06-23 NOTE — Progress Notes (Signed)
Patient ID: Tonny BranchEunice Camp Michael, female   DOB: [birth year], 54 y.o.   MRN: [id number] Pt with limited visibility in the milieu.  Pt remained in bed for most of the evening shift. Pt did not attend evening group.  Pt reported she did not sleep well last night and was up most of the day.  Needs assessed. Pt denied.  Support and encouragement provided. Pt receptive.  Fifteen minute checks in progress for patient safety. Pt safe on unit.

## 2015-06-24 LAB — HEMOGLOBIN A1C
Hgb A1c MFr Bld: 5.6 % (ref 4.8–5.6)
MEAN PLASMA GLUCOSE: 114 mg/dL

## 2015-06-24 MED ORDER — IBUPROFEN 600 MG PO TABS: 600.0000 mg | ORAL_TABLET | ORAL | Status: DC | PRN

## 2015-06-24 MED ORDER — NICOTINE 21 MG/24HR TD PT24: 21.0000 mg | MEDICATED_PATCH | Freq: Every day | TRANSDERMAL | Status: DC

## 2015-06-24 MED ORDER — TRAZODONE HCL 50 MG PO TABS: 50.0000 mg | ORAL_TABLET | Freq: Every evening | ORAL | Status: DC | PRN

## 2015-06-24 MED ORDER — SERTRALINE HCL 50 MG PO TABS: 50.0000 mg | ORAL_TABLET | Freq: Every day | ORAL | Status: DC

## 2015-06-24 MED ORDER — BUSPIRONE HCL 15 MG PO TABS: 15.0000 mg | ORAL_TABLET | Freq: Two times a day (BID) | ORAL | Status: DC

## 2015-06-24 MED ORDER — ACAMPROSATE CALCIUM 333 MG PO TBEC
666.0000 mg | DELAYED_RELEASE_TABLET | Freq: Three times a day (TID) | ORAL | Status: DC
  Administered 2015-06-24 – 2015-06-25 (×2): 666 mg via ORAL
  Filled 2015-06-24: qty 36
  Filled 2015-06-24: qty 2
  Filled 2015-06-24: qty 36
  Filled 2015-06-24: qty 2
  Filled 2015-06-24 (×2): qty 36

## 2015-06-24 MED ORDER — ADULT MULTIVITAMIN W/MINERALS CH: 1.0000 | ORAL_TABLET | Freq: Every day | ORAL | Status: DC

## 2015-06-24 MED ORDER — ACAMPROSATE CALCIUM 333 MG PO TBEC: 666.0000 mg | DELAYED_RELEASE_TABLET | Freq: Three times a day (TID) | ORAL | Status: DC

## 2015-06-24 NOTE — Discharge Summary (Signed)
Physician Discharge Summary Note  Patient:  Michelle Michael is an 54 y.o., female MRN:  [id number] DOB:  09-11-1961 Patient phone:  [phone] (home)  Patient address:   [place] [address],  Total Time spent with patient: 45 minutes  Date of Admission:  06/21/2015 Date of Discharge: 06/25/2015  Reason for Admission:  Per H&P Admission:  Michelle Michael, 54 yo initially seen at [facility]. Was brought in by EMS. She presents with ETOH abuse. She states she had been drinking a lot and took a "bunch of Benadryl." She reports living with a man for 8 years. The relationship was unhealthy and she was prevented from going to work and this had a negative effect on her psychologically. She moved out of that place. She reports drinking 12 pack/day and would do this throughout the day. She reports poor sleep. She was treated and completed a detox program 3x, last one in 2015. 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 She is denying suicidal ideation. She states she does not want to die. "It was more self-pity and no one will care" that drove her to take the benadryl overdose.   She states that this has been going on for 4 years. She started to lose desire to color her hair as an example. She found herself drinking more that she feared she would herself to death. With the drinking, she also described seeing visions of a man but she was under the influence of ETOH and states that could've have caused it.   Principal Problem: ETOH abuse Discharge Diagnoses: Patient Active Problem List   Diagnosis Date Noted  . ETOH abuse [F10.10] 06/21/2015  . Major depressive disorder, recurrent, severe without psychotic features [F33.2]   . Alcohol use disorder, severe, dependence [F10.20]   . Intentional diphenhydramine overdose [T45.0X2A] 06/17/2015  . Suicidal ideation [R45.851] 06/17/2015  . Alcoholism [F10.20] 06/17/2015  . Hypokalemia [E87.6] 06/17/2015  . Diphenhydramine overdose  [T45.0X1A] 06/17/2015  . Substance induced mood disorder [F19.94] 06/17/2015    Musculoskeletal: Strength & Muscle Tone: within normal limits Gait & Station: normal Patient leans: N/A  Psychiatric Specialty Exam:  See Suicide Risk Assessment Physical Exam  Nursing note and vitals reviewed. Constitutional: She is oriented to person, place, and time.  Neck: Normal range of motion.  Respiratory: Effort normal.  Musculoskeletal: Normal range of motion.  Neurological: She is alert and oriented to person, place, and time.    Review of Systems  Psychiatric/Behavioral: Positive for substance abuse. Negative for suicidal ideas and hallucinations. Depression: Stable. Nervous/anxious: Stable. Insomnia: Stable.   All other systems reviewed and are negative.   Blood pressure 114/72, pulse 92, temperature 98.6 F (37 C), temperature source Oral, resp. rate 18, height  (1.626 m), weight 66.225 kg (146 lb), SpO2 99 %.Body mass index is 25.05 kg/(m^2).  Have you used any form of tobacco in the last 30 days? (Cigarettes, Smokeless Tobacco, Cigars, and/or Pipes): Yes  Has this patient used any form of tobacco in the last 30 days? (Cigarettes, Smokeless Tobacco, Cigars, and/or Pipes) Yes, A prescription for an FDA-approved tobacco cessation medication was offered at discharge and the patient refused.  Patient already has RX.  Past Michelle History:  Past Michelle History  Diagnosis Date  . Depression   . Hypertension     Past Surgical History  Procedure Laterality Date  . Orthopedic surgery    . Cholecystectomy     Family History:  Family History  Problem Relation Age of  Onset  . Hypertension Mother   . Hypertension Father    Social History:  History  Alcohol Use  . Yes    Comment: "2 quartz of beer a day"     History  Drug Use No    History   Social History  . Marital Status: Legally Separated    Spouse Name: N/A  . Number of Children: N/A  . Years of Education: N/A    Social History Main Topics  . Smoking status: Current Every Day Smoker -- 1.00 packs/day    Types: Cigarettes  . Smokeless tobacco: Not on file  . Alcohol Use: Yes     Comment: "2 quartz of beer a day"  . Drug Use: No  . Sexual Activity: Not on file   Other Topics Concern  . None   Social History Narrative    Risk to Self: Is patient at risk for suicide?: Yes What has been your use of drugs/alcohol within the last 12 months?: ETOH daily use- 12-18 beers on average and occasionally vodka for almost a year  Risk to Others:   Prior Inpatient Therapy:   Prior Outpatient Therapy:    Level of Care:  RTC  Hospital Course:  Michelle Michael was admitted for ETOH abuse and crisis management.  She was treated discharged with the medications listed below under Medication List.  Michelle problems were identified and treated as needed.  Home medications were restarted as appropriate.  Improvement was monitored by observation and Michelle Michael daily report of symptom reduction.  Emotional and mental status was monitored by daily self-inventory reports completed by Michelle Michael and clinical staff.         Michelle Michael was evaluated by the treatment team for stability and plans for continued recovery upon discharge.  Michelle Michael motivation was an integral factor for scheduling further treatment.  Employment, transportation, bed availability, health status, family support, and any pending legal issues were also considered during her hospital stay.  She was offered further treatment options upon discharge including but not limited to Residential, Intensive Outpatient, and Outpatient treatment.  Michelle Michael will follow up with the services as listed below under Follow Up Information.     Upon completion of this admission the patient was both mentally and medically stable for discharge denying suicidal/homicidal ideation, auditory/visual/tactile hallucinations, delusional  thoughts and paranoia.      Consults:  psychiatry  Significant Diagnostic Studies:  labs: UDS, ETOH, CMET, UDS, CBC/Diff  Discharge Vitals:   Blood pressure 114/72, pulse 92, temperature 98.6 F (37 C), temperature source Oral, resp. rate 18, height 5\' 4"  (1.626 m), weight 66.225 kg (146 lb), SpO2 99 %. Body mass index is 25.05 kg/(m^2). Lab Results:   Results for orders placed or performed during the hospital encounter of 06/21/15 (from the past 72 hour(s))  TSH     Status: None   Collection Time: 06/22/15  6:20 AM  Result Value Ref Range   TSH 1.036 0.350 - 4.500 uIU/mL    Comment: Performed at [facility]  Hemoglobin A1c     Status: None   Collection Time: 06/23/15  6:20 AM  Result Value Ref Range   Hgb A1c MFr Bld 5.6 4.8 - 5.6 %    Comment: (NOTE)         Pre-diabetes: 5.7 - 6.4         Diabetes: >6.4         Glycemic control for  adults with diabetes: <7.0    Mean Plasma Glucose 114 mg/dL    Comment: (NOTE) Performed At: [facility] [address] [place], [place] [id number] Mila HomerHancock William F MD WU:[id number]:[phone] Performed at [facility]     Physical Findings: AIMS: Facial and Oral Movements Muscles of Facial Expression: None, normal Lips and Perioral Area: None, normal Jaw: None, normal Tongue: None, normal,Extremity Movements Upper (arms, wrists, hands, fingers): None, normal Lower (legs, knees, ankles, toes): None, normal, Trunk Movements Neck, shoulders, hips: None, normal, Overall Severity Severity of abnormal movements (highest score from questions above): None, normal Incapacitation due to abnormal movements: None, normal Patient's awareness of abnormal movements (rate only patient's report): No Awareness, Dental Status Current problems with teeth and/or dentures?: No Does patient usually wear dentures?: No  CIWA:  CIWA-Ar Total: 2 COWS:      See Psychiatric Specialty Exam and Suicide Risk Assessment completed  by Attending Physician prior to discharge.  Discharge destination:  [facility]  Is patient on multiple antipsychotic therapies at discharge:  No   Has Patient had three or more failed trials of antipsychotic monotherapy by history:  No    Recommended Plan for Multiple Antipsychotic Therapies: NA  Discharge Instructions    Activity as tolerated - No restrictions    Complete by:  As directed      Diet general    Complete by:  As directed      Discharge instructions    Complete by:  As directed   Take all of you medications as prescribed by your mental healthcare provider.  Report any adverse effects and reactions from your medications to your outpatient provider promptly. Do not engage in alcohol and or illegal drug use while on prescription medicines. In the event of worsening symptoms call the crisis hotline, 911, and or go to the nearest emergency department for appropriate evaluation and treatment of symptoms. Follow-up with your primary care provider for your Michelle issues, concerns and or health care needs.   Keep all scheduled appointments.  If you are unable to keep an appointment call to reschedule.  Let the nurse know if you will need medications before next scheduled appointment.            Medication List    STOP taking these medications        acetaminophen 325 MG tablet  Commonly known as:  TYLENOL     chlordiazePOXIDE 10 MG capsule  Commonly known as:  LIBRIUM     folic acid 1 MG tablet  Commonly known as:  FOLVITE     hydroxypropyl methylcellulose / hypromellose 2.5 % ophthalmic solution  Commonly known as:  ISOPTO TEARS / GONIOVISC     thiamine 100 MG tablet      TAKE these medications      Indication   acamprosate 333 MG tablet  Commonly known as:  CAMPRAL  Take 2 tablets (666 mg total) by mouth 3 (three) times daily with meals.   Indication:  Excessive Use of Alcohol     busPIRone 15 MG tablet  Commonly known as:  BUSPAR  Take 1  tablet (15 mg total) by mouth 2 (two) times daily.   Indication:  Anxiety Disorder     multivitamin with minerals Tabs tablet  Take 1 tablet by mouth daily.   Indication:  Nutritional Support     nicotine 21 mg/24hr patch  Commonly known as:  NICODERM CQ - dosed in mg/24 hours  Place 1 patch (21  mg total) onto the skin daily.   Indication:  Nicotine Addiction     sertraline 50 MG tablet  Commonly known as:  ZOLOFT  Take 1 tablet (50 mg total) by mouth at bedtime.   Indication:  Major Depressive Disorder     traZODone 50 MG tablet  Commonly known as:  DESYREL  Take 1 tablet (50 mg total) by mouth at bedtime as needed for sleep.   Indication:  Trouble Sleeping           Follow-up Information    Follow up with [facility] On 06/25/2015.   Why:  at 8:00am for your screening.    Contact information:   [address] [place], [place] [address] Phone:([phone]      Follow-up recommendations:  Activity:  As tolerated Diet:  As tolerated  Comments:   Patient has been instructed to take medications as prescribed; and report adverse effects to outpatient provider.  Follow up with primary doctor for any Michelle issues and If symptoms recur report to nearest emergency or crisis hot line.    Total Discharge Time: 94 Minutes  Signed: Assunta Found, FNP-BC 06/24/2015, 3:44 PM  I personally assessed the patient and formulated the plan Madie Reno A. Dub Mikes, M.D.

## 2015-06-24 NOTE — Progress Notes (Signed)
D: Pt is appropriate in affect and mood. Pt presents with some anxiety in regards to be discharged. However, pt is definitely looking forward to her appt with [facility] on 06/25/15. Pt is currently denying any SI/HI/AVH. Pt is visible and active within the milieu. Pt verbalizes a plan to prepare tonight in regards to her morning [facility]. A: Writer administered scheduled medications to pt, per MD orders. Continued support and availability as needed was extended to this pt. Staff continue to monitor pt with q6315min checks.  R: No adverse drug reactions noted. Pt receptive to treatment. Pt remains safe at this time.

## 2015-06-24 NOTE — Progress Notes (Signed)
Recreation Therapy Notes  Date: 06.29.16 Time: 9:30 am Location: 300 Hall Dayroom  Group Topic: Stress Management  Goal Area(s) Addresses:  Patient will verbalize importance of using healthy stress management.  Patient will identify positive emotions associated with healthy stress management.   Behavioral Response: Engaged  Intervention: Stress Management  Activity : Progressive Muscle Relaxation. LRT introduced and educated patients on stress management technique of progressive muscle relaxation. A script was used to to deliver the technique to patients. Patients were asked to follow script read aloud by LRT to engage in the stress management technique.  Education: Stress Management, Discharge Planning.   Education Outcome: Acknowledges edcuation/In group clarification offered/Needs additional education  Clinical Observations/Feedback: Patient attended group.  Jasmarie Coppock , LRT/CTRS        Shjon Lizarraga A 06/24/2015 3:08 PM 

## 2015-06-24 NOTE — Progress Notes (Signed)
  [facility] Adult [facility] Discharge Plan :  Will you be returning to the same living situation after discharge:  No. Pt is going to [facility] for continued substance abuse treatment. At discharge, do you have transportation home?: Yes,  Pt provided with taxi voucher Do you have the ability to pay for your medications: Yes,  Pt provided with 2-week supply and prescriptions  Release of information consent forms completed and in the chart;  Patient's signature needed at discharge.  Patient to Follow up at: Follow-up Information    [facility] On 06/[facility]/2015.   Why:  at 8:00am for your screening.    Contact information:   [address] [place], [place] [address] [phone]      Patient denies SI/HI: Yes,  Pt denies    Safety Planning and Suicide Prevention discussed: Yes,  with Pt. Declined family contact  Have you used any form of tobacco in the last 30 days? (Cigarettes, Smokeless Tobacco, Cigars, and/or Pipes): Yes  Has patient been referred to the Quitline?: Yes, faxed on 06/24/15  Michelle HoopsCarter, Michelle Michael 06/24/2015, 3:17 PM

## 2015-06-24 NOTE — Progress Notes (Signed)
D: Per patient self inventory form patient reports she slept good last night with the use of sleep medication. She reports a fair appetite, low energy level, and good concentration. She rates depression 6/10, hopelessness 5/10, and anxiety 5/10- all on 1-10 scale, 10 being the worse. She reports withdrawal symptom of irritability. She denies SI/HI. She denies AVH. She c/o back pain 5/10. "Im not depressed like I was , Im feeling mediocre." Observed in the day room with peers.   A: Special checks q 15 mins in place for safety. Medication administered per MD order (see eMAR.) Pain medication given PRN for back pain. Encouragement and counseling provided.   R:Safety maintained. Complaint with medication regimen. Reports relief from pain medication. Will continue to monitor.

## 2015-06-24 NOTE — [facility] Group Notes (Signed)
[facility] LCSW Group Therapy 06/24/2015 1:15 PM  Pt did not attend, declined invitation.   Chad CordialLauren Carter, LCSWA 06/24/2015 2:38 PM

## 2015-06-24 NOTE — [facility] Suicide Risk Assessment (Signed)
[facility]   Demographic Factors:  Caucasian  Total Time spent with patient: 30 minutes  Musculoskeletal: Strength & Muscle Tone: within normal limits Gait & Station: normal Patient leans: normal  Psychiatric Specialty Exam: Physical Exam  Review of Systems  Constitutional: Negative.   HENT: Negative.   Eyes: Negative.   Respiratory: Negative.   Cardiovascular: Negative.   Gastrointestinal: Negative.   Genitourinary: Negative.   Musculoskeletal:       Leg pain  Skin: Negative.   Neurological: Negative.   Endo/Heme/Allergies: Negative.   Psychiatric/Behavioral: Positive for substance abuse. The patient is nervous/anxious.     Blood pressure 118/64, pulse 109, temperature 98.6 F (37 C), temperature source Oral, resp. rate 18, height 5\' 4"  (1.626 m), weight 66.225 kg (146 lb), SpO2 99 %.Body mass index is 25.05 kg/(m^2).  General Appearance: Fairly Groomed  Patent attorneyye Contact::  Fair  Speech:  Clear and Coherent409  Volume:  Normal  Mood:  Euthymic  Affect:  Appropriate  Thought Process:  Coherent and Goal Directed  Orientation:  Full (Time, Place, and Person)  Thought Content:  plans as she moves on, relapse prevention plan  Suicidal Thoughts:  No  Homicidal Thoughts:  No  Memory:  Immediate;   Fair Recent;   Fair Remote;   Fair  Judgement:  Fair  Insight:  Present  Psychomotor Activity:  Normal  Concentration:  Fair  Recall:  FiservFair  Fund of Knowledge:Fair  Language: Fair  Akathisia:  No  Handed:  Right  AIMS (if indicated):     Assets:  Desire for Improvement  Sleep:  Number of Hours: 6  Cognition: WNL  ADL's:  Intact   Have you used any form of tobacco in the last 30 days? (Cigarettes, Smokeless Tobacco, Cigars, and/or Pipes): Yes  Has this patient used any form of tobacco in the last 30 days? (Cigarettes, Smokeless Tobacco, Cigars, and/or Pipes) Yes, A prescription for an FDA-approved tobacco cessation medication was offered at  discharge and the patient refused  Mental Status Per Nursing Assessment::   On Admission:     Current Mental Status by Physician: IN full contact with reality. There are no active S/S of withdrawal. There are no active SI plans or intent. She is willing and motivated to pursue residential treatment. She is tolerating her medications well so far.   Loss Factors: Decrease in vocational status and Financial problems/change in socioeconomic status  Historical Factors: NA  Risk Reduction Factors:   motivated resilient  Continued Clinical Symptoms:  Depression:   Comorbid alcohol abuse/dependence Alcohol/Substance Abuse/Dependencies  Cognitive Features That Contribute To Risk:  Closed-mindedness, Polarized thinking and Thought constriction (tunnel vision)    Suicide Risk:  Minimal: No identifiable suicidal ideation.  Patients presenting with no risk factors but with morbid ruminations; may be classified as minimal risk based on the severity of the depressive symptoms  Principal Problem: ETOH abuse Discharge Diagnoses:  Patient Active Problem List   Diagnosis Date Noted  . ETOH abuse [F10.10] 06/21/2015  . Major depressive disorder, recurrent, severe without psychotic features [F33.2]   . Alcohol use disorder, severe, dependence [F10.20]   . Intentional diphenhydramine overdose [T45.0X2A] 06/17/2015  . Suicidal ideation [R45.851] 06/17/2015  . Alcoholism [F10.20] 06/17/2015  . Hypokalemia [E87.6] 06/17/2015  . Diphenhydramine overdose [T45.0X1A] 06/17/2015  . Substance induced mood disorder [F19.94] 06/17/2015    Follow-up Information    [facility] On 06[facility]/2015.   Why:  at 8:00am for your screening.  Contact information:   [address] [place], [place] [address] [phone]      Plan Of Care/Follow-up recommendations:  Activity:  as tolerated Diet:  regular Follow up [facility] as above Is patient on multiple antipsychotic therapies at  discharge:  No   Has Patient had three or more failed trials of antipsychotic monotherapy by history:  No  Recommended Plan for Multiple Antipsychotic Therapies: NA    Mychele Seyller A 06/24/2015, 6:28 PM

## 2015-06-24 NOTE — Plan of Care (Signed)
Problem: Diagnosis: Increased Risk For Suicide Attempt Goal: LTG-Patient Will Report Improved Mood and Deny Suicidal LTG (by discharge) Patient will report improved mood and deny suicidal ideation.  Outcome: Progressing Patient reports improved mood and denies suicidal ideations.

## 2015-06-24 NOTE — Progress Notes (Signed)
Pt attended the NA speaker meeting. Pt was engaged and attentive. 

## 2015-06-24 NOTE — [facility] Group Notes (Signed)
[facility]  06/24/2015 8:45 AM  Participation Quality: Alert, Appropriate and Oriented  Mood/Affect: Appropriate  Depression Rating: 5  Anxiety Rating: 5  Thoughts of Suicide: Pt denies SI/HI  Will you contract for safety? Yes  Current AVH: Pt denies  Plan for Discharge/Comments: Pt attended discharge planning group and actively participated in group. CSW discussed suicide prevention education with the group and encouraged them to discuss discharge planning and any relevant barriers. Pt discussed with CSW getting a screening with [facility]. Pt expressed being relieved to hear that they could take her given the mishap with not having an ID.  Transportation Means: Pt reports access to transportation  Supports: No supports mentioned at this time  Chad CordialLauren Carter, LCSWA 06/24/2015 1:08 PM

## 2015-06-24 NOTE — Plan of Care (Signed)
Problem: Ineffective individual coping Goal: LTG: Patient will report a decrease in negative feelings Outcome: Progressing Patient reports a decrease in negative feelings.

## 2015-06-24 NOTE — Progress Notes (Addendum)
[facility] MD Progress Note  06/24/2015 6:20 PM Michelle Michael  MRN:  [id number] Subjective:  Did sleep better last night. States she is overall feeling better. She is tolerating the medications well so far. States she is looking forward to go to [facility] and continue to work on her Recovery. Does admit she is anxious about going to a new place after getting used to being here.. Wants to get her life together Principal Problem: ETOH abuse Diagnosis:   Patient Active Problem List   Diagnosis Date Noted  . ETOH abuse [F10.10] 06/21/2015  . Major depressive disorder, recurrent, severe without psychotic features [F33.2]   . Alcohol use disorder, severe, dependence [F10.20]   . Intentional diphenhydramine overdose [T45.0X2A] 06/17/2015  . Suicidal ideation [R45.851] 06/17/2015  . Alcoholism [F10.20] 06/17/2015  . Hypokalemia [E87.6] 06/17/2015  . Diphenhydramine overdose [T45.0X1A] 06/17/2015  . Substance induced mood disorder [F19.94] 06/17/2015   Total Time spent with patient: 30 minutes   Past Medical History:  Past Medical History  Diagnosis Date  . Depression   . Hypertension     Past Surgical History  Procedure Laterality Date  . Orthopedic surgery    . Cholecystectomy     Family History:  Family History  Problem Relation Age of Onset  . Hypertension Mother   . Hypertension Father    Social History:  History  Alcohol Use  . Yes    Comment: "2 quartz of beer a day"     History  Drug Use No    History   Social History  . Marital Status: Legally Separated    Spouse Name: N/A  . Number of Children: N/A  . Years of Education: N/A   Social History Main Topics  . Smoking status: Current Every Day Smoker -- 1.00 packs/day    Types: Cigarettes  . Smokeless tobacco: Not on file  . Alcohol Use: Yes     Comment: "2 quartz of beer a day"  . Drug Use: No  . Sexual Activity: Not on file   Other Topics Concern  . None   Social History Narrative   Additional History:     Sleep: Fair  Appetite:  Fair   Assessment:   Musculoskeletal: Strength & Muscle Tone: within normal limits Gait & Station: normal Patient leans: normal   Psychiatric Specialty Exam: Physical Exam  Review of Systems  Constitutional: Negative.   HENT: Negative.   Eyes: Negative.   Respiratory: Negative.   Cardiovascular: Negative.   Gastrointestinal: Negative.   Genitourinary: Negative.   Musculoskeletal:       Leg pain  Skin: Negative.   Neurological: Negative.   Endo/Heme/Allergies: Negative.   Psychiatric/Behavioral: Positive for depression and substance abuse. The patient is nervous/anxious.     Blood pressure 118/64, pulse 109, temperature 98.6 Michael (37 C), temperature source Oral, resp. rate 18, height  (1.626 m), weight 66.225 kg (146 lb), SpO2 99 %.Body mass index is 25.05 kg/(m^2).  General Appearance: Fairly Groomed  Patent attorney::  Fair  Speech:  Clear and Coherent  Volume:  Decreased  Mood:  Anxious and worried  Affect:  Appropriate  Thought Process:  Coherent and Goal Directed  Orientation:  Full (Time, Place, and Person)  Thought Content:  symptoms events worries concerns  Suicidal Thoughts:  No  Homicidal Thoughts:  No  Memory:  Immediate;   Fair Recent;   Fair Remote;   Fair  Judgement:  Fair  Insight:  Present  Psychomotor Activity:  Restlessness  Concentration:  Fair  Recall:  Michelle HumanFair  Fund of Knowledge:Fair  Language: Fair  Akathisia:  No  Handed:  Right  AIMS (if indicated):     Assets:  Desire for Improvement  ADL's:  Intact  Cognition: WNL  Sleep:  Number of Hours: 6     Current Medications: Current Facility-Administered Medications  Medication Dose Route Frequency Provider Last Rate Last Dose  . acamprosate (CAMPRAL) tablet 666 mg  666 mg Oral TID WC Michelle FeeIrving A Ashleah Valtierra, MD   666 mg at 06/24/15 1613  . acetaminophen (TYLENOL) tablet 650 mg  650 mg Oral Q6H PRN Michelle BrookSheila Agustin, Michael   650 mg at 06/24/15 [id number]  . alum & mag  hydroxide-simeth (MAALOX/MYLANTA) 200-200-20 MG/5ML suspension 30 mL  30 mL Oral Q4H PRN Michelle BrookSheila Agustin, Michael      . busPIRone (BUSPAR) tablet 15 mg  15 mg Oral BID Michelle FeeIrving A Lynann Demetrius, MD   15 mg at 06/24/15 1613  . ibuprofen (ADVIL,MOTRIN) tablet 600 mg  600 mg Oral Q4H PRN Michelle FeeIrving A Michelle Creveling, MD      . magnesium hydroxide (MILK OF MAGNESIA) suspension 30 mL  30 mL Oral Daily PRN Michelle BrookSheila Agustin, Michael      . multivitamin with minerals tablet 1 tablet  1 tablet Oral Daily Michelle BrookSheila Agustin, Michael   1 tablet at 06/24/15 0804  . nicotine (NICODERM CQ - dosed in mg/24 hours) patch 21 mg  21 mg Transdermal Daily Michelle FeeIrving A Enrika Aguado, MD   21 mg at 06/24/15 0807  . sertraline (ZOLOFT) tablet 50 mg  50 mg Oral Daily Michelle BrookSheila Agustin, Michael   50 mg at 06/24/15 0804  . thiamine (B-1) injection 100 mg  100 mg Intramuscular Once Michelle BrookSheila Agustin, Michael   100 mg at 06/21/15 1300  . thiamine (VITAMIN B-1) tablet 100 mg  100 mg Oral Daily Michelle BrookSheila Agustin, Michael   100 mg at 06/24/15 0804  . traZODone (DESYREL) tablet 50 mg  50 mg Oral QHS PRN Michelle FeeIrving A Earla Charlie, MD   50 mg at 06/23/15 2156    Lab Results:  Results for orders placed or performed during the hospital encounter of 06/21/15 (from the past 48 hour(s))  Hemoglobin A1c     Status: None   Collection Time: 06/23/15  6:20 AM  Result Value Ref Range   Hgb A1c MFr Bld 5.6 4.8 - 5.6 %    Comment: (NOTE)         Pre-diabetes: 5.7 - 6.4         Diabetes: >6.4         Glycemic control for adults with diabetes: <7.0    Mean Plasma Glucose 114 mg/dL    Comment: (NOTE) Performed At: [facility] [address] [place], [place] [id number] Michelle HomerHancock William F MD FA:[phone] [facility]     Physical Findings: AIMS: Facial and Oral Movements Muscles of Facial Expression: None, normal Lips and Perioral Area: None, normal Jaw: None, normal Tongue: None, normal,Extremity Movements Upper (arms, wrists, hands, fingers): None, normal Lower (legs, knees, ankles,  toes): None, normal, Trunk Movements Neck, shoulders, hips: None, normal, Overall Severity Severity of abnormal movements (highest score from questions above): None, normal Incapacitation due to abnormal movements: None, normal Patient's awareness of abnormal movements (rate only patient's report): No Awareness, Dental Status Current problems with teeth and/or dentures?: No Does patient usually wear dentures?: No  CIWA:  CIWA-Ar Total: 2 COWS:     Treatment Plan Summary: Daily contact with patient to assess  and evaluate symptoms and progress in treatment and Medication management Supportive approach/coping skills Alcohol dependence; complete the detox Alcohol cravings; will start Campral 33 mg two TID Will help east the transition to [facility] in the morning Will discuss the Hemoglobin Hgb A 1 C results  ( high end of the normal values) Leg pain R/O discogenic disease; motrin 600 mg Q 6 H PRN Medical Decision Making:  Review of Psycho-Social Stressors (1), Review or order clinical lab tests (1), Review of Medication Regimen & Side Effects (2) and Review of New Medication or Change in Dosage (2)     Sherrye Puga A 06/24/2015, 6:20 PM

## 2015-06-25 NOTE — Plan of Care (Signed)
Problem: Alteration in mood & ability to function due to Goal: STG-Pt will be introduced to the 12-step program of recovery (Patient will be introduced to the 12-step program of recovery and disease concept of addiction)  Outcome: Completed/Met Date Met:  06/25/15 Pt attended Narcotic Anonymous this evening.

## 2015-06-25 NOTE — Progress Notes (Signed)
Pt discharged with signed AVS, belongings, and medication samples. Writer reviewed AVS with pt for comprehension. Pt denies any questions for Clinical research associatewriter. Pt received her 0700 medication. Pt [facility] to [facility] lobby [facility] Pt wished well on her road to recovery.

## 2015-07-19 IMAGING — US US ABDOMEN COMPLETE
1 series · 14 of 25 positions shown · non-contrast
Comparison: None.

CLINICAL DATA: Elevated liver function test

EXAM:
ULTRASOUND ABDOMEN COMPLETE

[Series 1: us abdomen complete · 0.24mm/px · 14 of 64 slices shown]
[im 1/64]
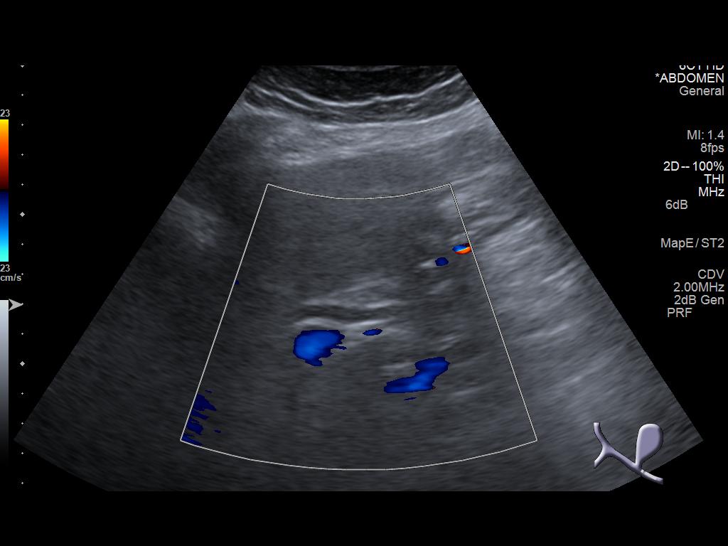
[im 6/64]
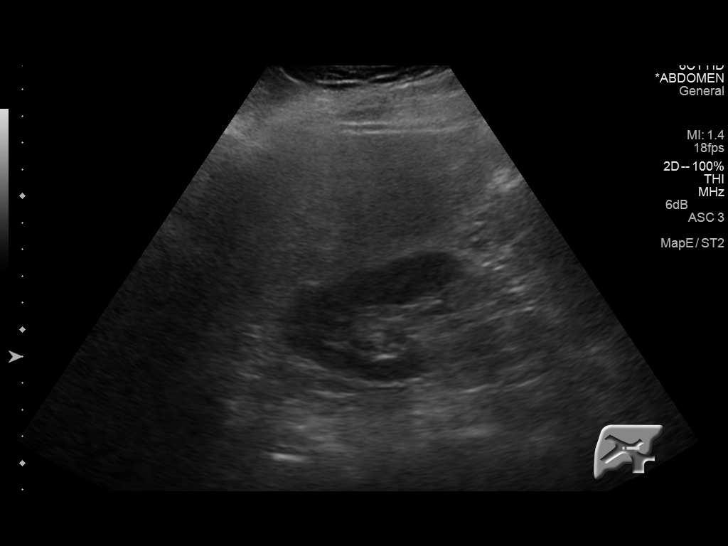
[im 11/64]
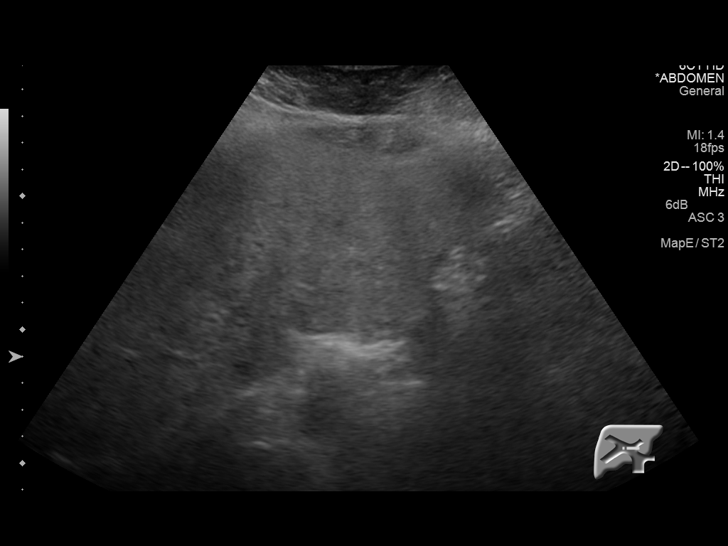
[im 16/64]
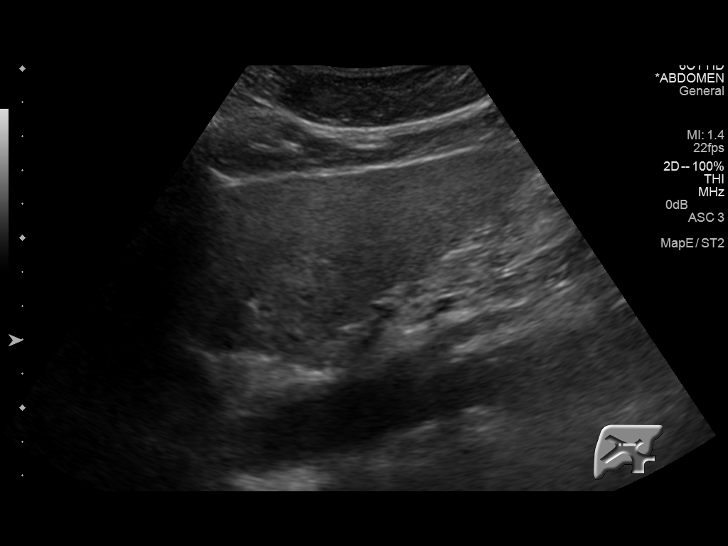
[im 22/64]
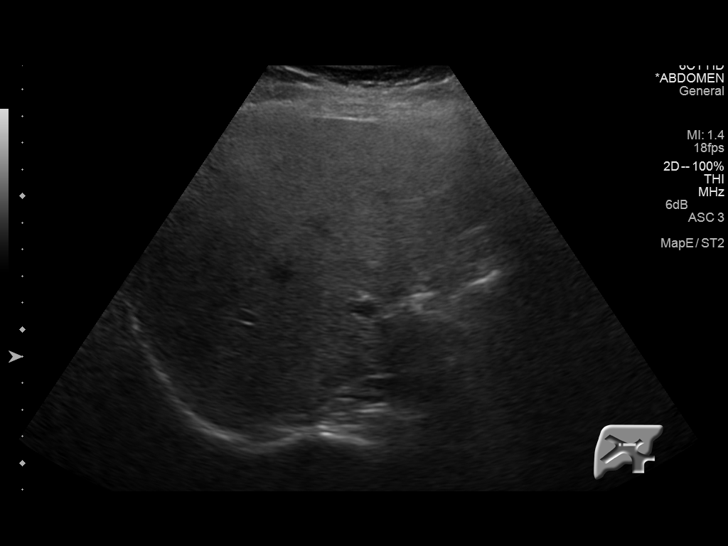
[im 24/64]
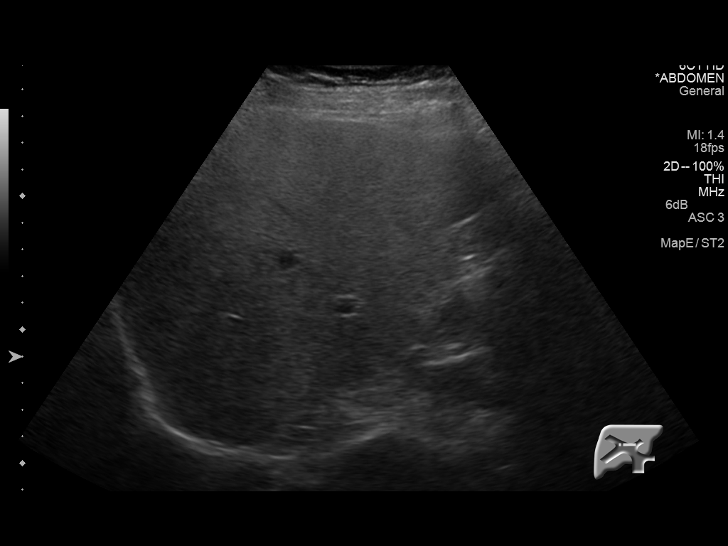
[im 29/64]
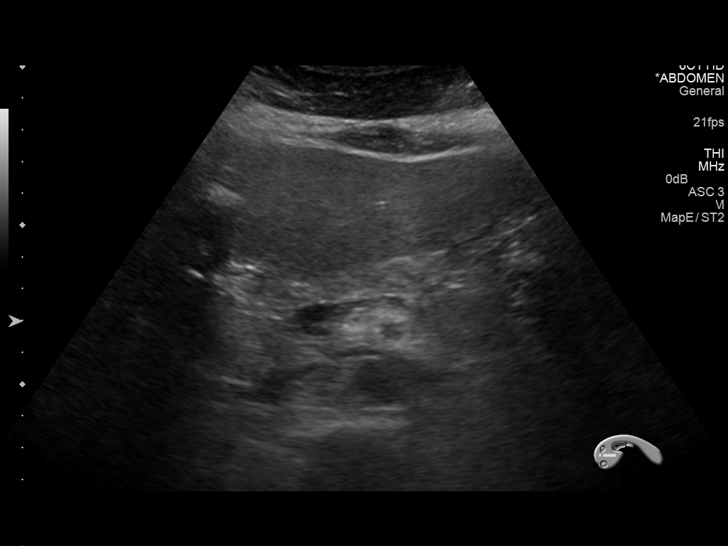
[im 35/64]
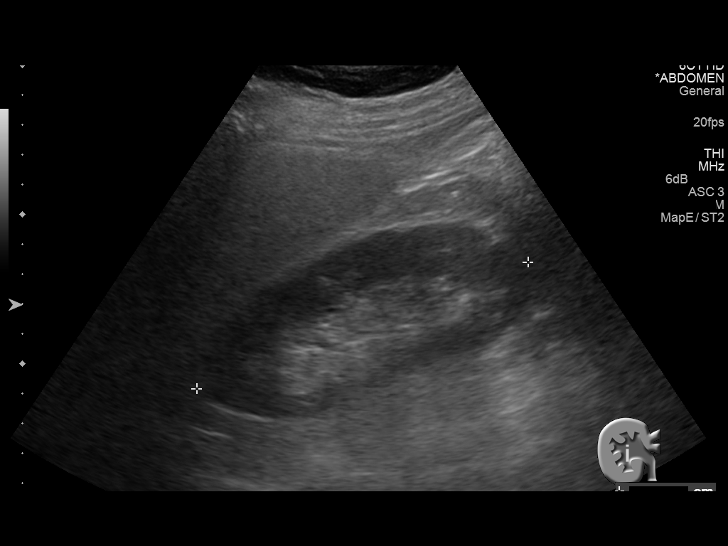
[im 40/64]
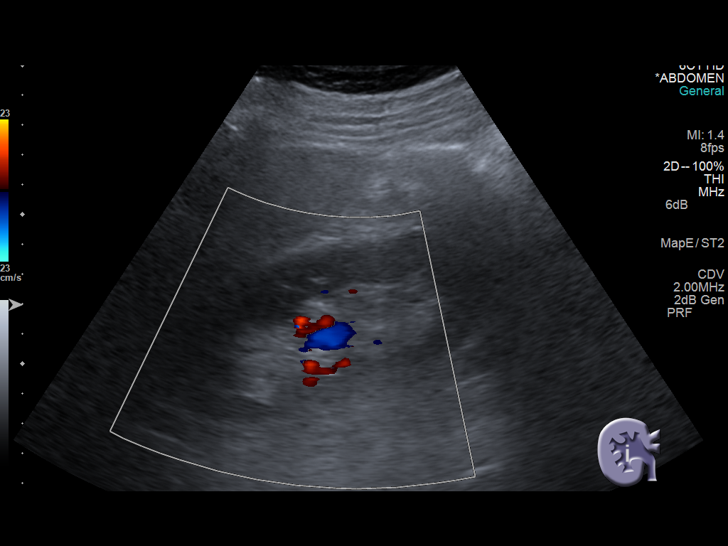
[im 43/64]
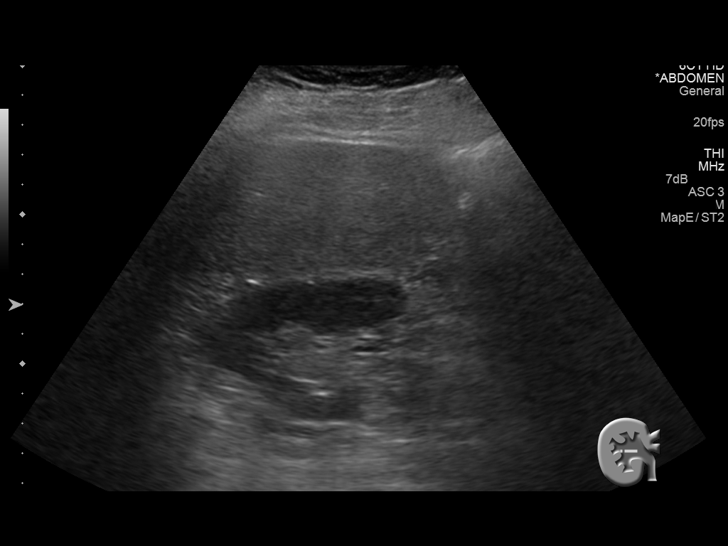
[im 48/64]
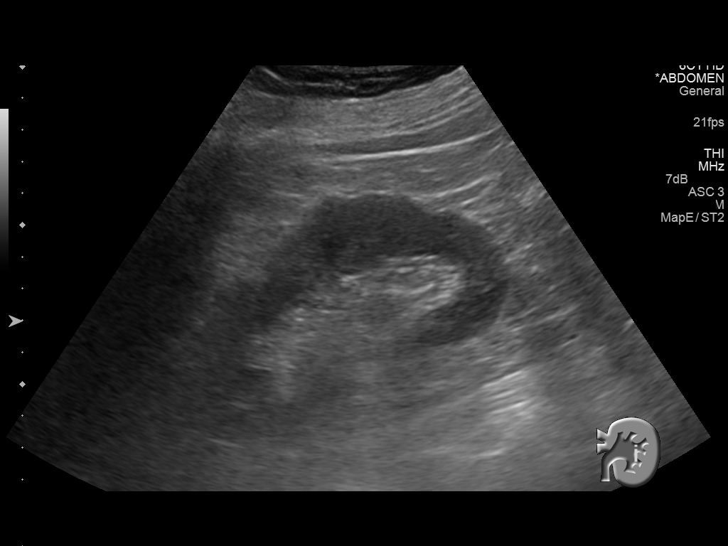
[im 53/64]
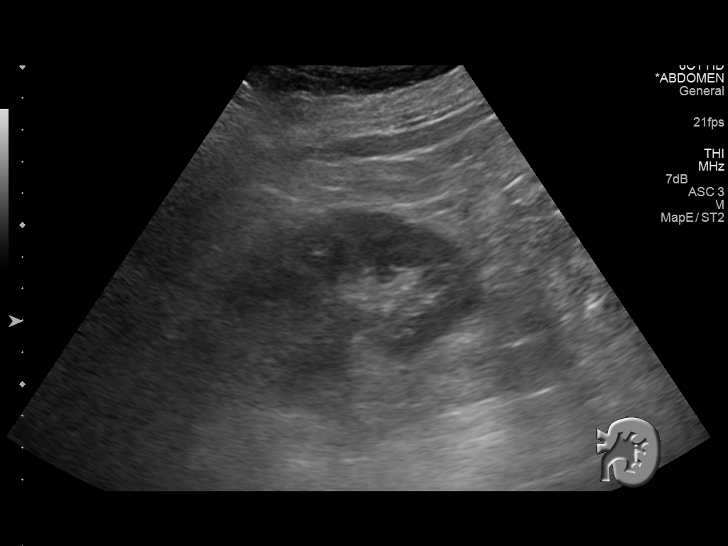
[im 58/64]
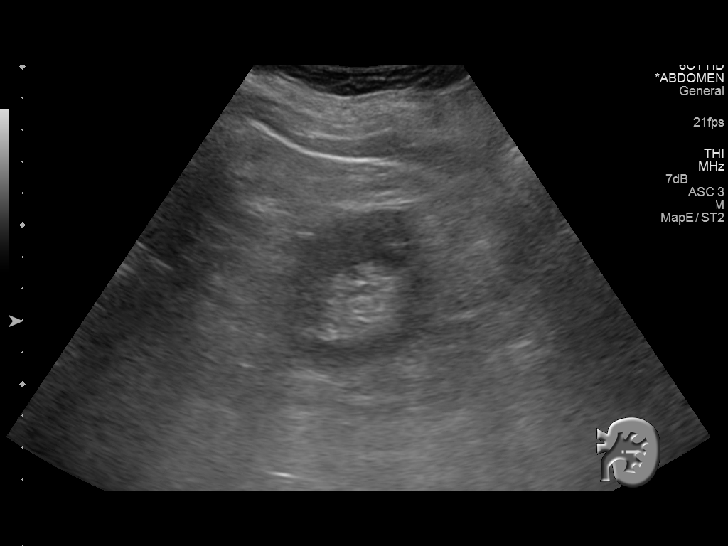
[im 64/64]
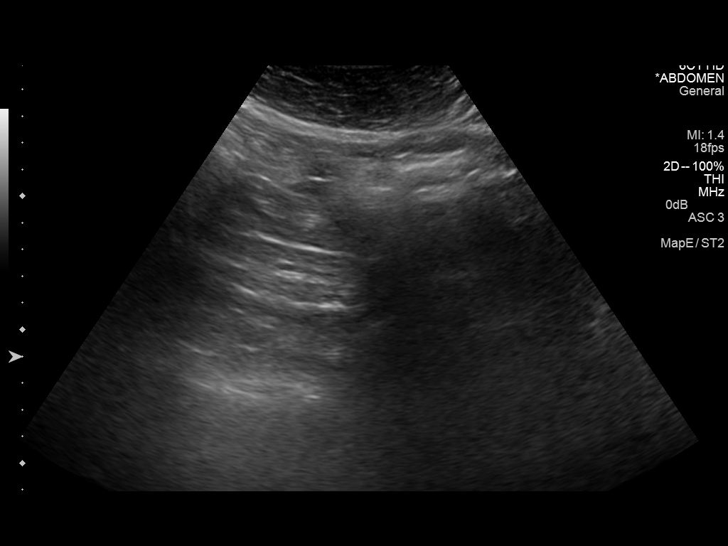

[14 of 25 positions shown; findings below may reference images not displayed]

FINDINGS: Gallbladder: Status post prior cholecystectomy

Common bile duct: Diameter: 5.5 mm

Liver: No focal lesion identified. There is mild diffuse increased
echotexture of the liver.

IVC: No abnormality visualized.

Pancreas: Visualized portion unremarkable.

Spleen: Size and appearance within normal limits.

Right Kidney: Length: 11.9 cm. Echogenicity within normal limits. No
mass or hydronephrosis visualized.

Left Kidney: Length: 11 cm. Echogenicity within normal limits. No
mass or hydronephrosis visualized.

Abdominal aorta: No aneurysm visualized.  Distal aorta is not seen.

Other findings: None.
IMPRESSION: Mild diffuse increased echotexture of the liver, nonspecific, but
can be seen in fatty infiltration of liver.

## 2020-03-27 LAB — HM COLONOSCOPY

## 2020-04-06 LAB — HM COLONOSCOPY

## 2021-05-21 LAB — LIPID PANEL
LDL Cholesterol: 135
Triglycerides: 123 (ref 40–160)

## 2021-05-21 LAB — MICROALBUMIN, URINE: Microalb, Ur: 2

## 2021-06-24 LAB — HEMOGLOBIN A1C: Hemoglobin A1C: 6.1

## 2021-11-03 NOTE — Patient Instructions (Signed)

## 2021-11-04 ENCOUNTER — Other Ambulatory Visit: Payer: Self-pay

## 2021-11-04 ENCOUNTER — Ambulatory Visit (INDEPENDENT_AMBULATORY_CARE_PROVIDER_SITE_OTHER): Payer: Medicaid Other | Admitting: Nurse Practitioner

## 2021-11-04 ENCOUNTER — Encounter: Payer: Self-pay | Admitting: Nurse Practitioner

## 2021-11-04 VITALS — BP 126/74 | HR 102 | Ht 64.25 in | Wt 190.0 lb

## 2021-11-04 DIAGNOSIS — E119 Type 2 diabetes mellitus without complications: Secondary | ICD-10-CM

## 2021-11-04 DIAGNOSIS — I10 Essential (primary) hypertension: Secondary | ICD-10-CM

## 2021-11-04 DIAGNOSIS — Z794 Long term (current) use of insulin: Secondary | ICD-10-CM | POA: Diagnosis not present

## 2021-11-04 DIAGNOSIS — E782 Mixed hyperlipidemia: Secondary | ICD-10-CM | POA: Diagnosis not present

## 2021-11-04 MED ORDER — METFORMIN HCL ER 500 MG PO TB24: 500.0000 mg | ORAL_TABLET | Freq: Every day | ORAL | 0 refills | Status: DC

## 2021-11-04 MED ORDER — "INSULIN SYRINGE 29G X 1/2"" 0.5 ML MISC": 3 refills | Status: DC

## 2021-11-04 NOTE — Progress Notes (Signed)
Endocrinology Consult Note       11/04/2021, 2:50 PM   Subjective:    Patient ID: Michelle Michael, female    DOB: [birth year].  Michelle Michael is being seen in consultation for management of currently uncontrolled symptomatic diabetes requested by  Eugenia Pancoast E, DO.   Past Medical History:  Diagnosis Date   Anemia    Cirrhosis ([place])    CKD (chronic kidney disease)    Depression    Diabetes mellitus, type II ([place])    Hyperlipidemia    Hypertension    Hypokalemia    Hyponatremia     Past Surgical History:  Procedure Laterality Date   CHOLECYSTECTOMY     ORTHOPEDIC SURGERY      Social History   Socioeconomic History   Marital status: Legally Separated    Spouse name: Not on file   Number of children: Not on file   Years of education: Not on file   Highest education level: Not on file  Occupational History   Not on file  Tobacco Use   Smoking status: Every Day    Packs/day: 1.00    Types: Cigarettes   Smokeless tobacco: Never  Vaping Use   Vaping Use: Former  Substance and Sexual Activity   Alcohol use: Yes    Comment: "2 quartz of beer a day"   Drug use: No   Sexual activity: Not on file  Other Topics Concern   Not on file  Social History Narrative   Not on file   Social Determinants of Health   Financial Resource Strain: Not on file  Food Insecurity: Not on file  Transportation Needs: Not on file  Physical Activity: Not on file  Stress: Not on file  Social Connections: Not on file    Family History  Problem Relation Age of Onset   Diabetes Mother    Hypertension Mother    Hypertension Father     Outpatient Encounter Medications as of 11/04/2021  Medication Sig   albuterol (VENTOLIN HFA) 108 (90 Base) MCG/ACT inhaler Inhale into the lungs.   BD PEN NEEDLE NANO 2ND GEN 32G X 4 MM MISC USE 1 PEN NEEDLE ONCE DAILY   busPIRone (BUSPAR) 10 MG tablet One tab at 8 Am - 1 PM - 8  PM   Continuous Blood Gluc Receiver ([facility]) DEVI by Does not apply route.   Continuous Blood Gluc Sensor (DEXCOM G6 SENSOR) MISC SMARTSIG:1 Topical Every 10 Days   Continuous Blood Gluc Transmit (DEXCOM G6 TRANSMITTER) MISC USE EVERY 3 MONTHS   DULoxetine (CYMBALTA) 60 MG capsule Take 60 mg by mouth every morning.   fluticasone (FLONASE) 50 MCG/ACT nasal spray Place 2 sprays into both nostrils daily.   folic acid (FOLVITE) 1 MG tablet Take by mouth.   furosemide (LASIX) 40 MG tablet Take 40 mg by mouth daily.   gabapentin (NEURONTIN) 300 MG capsule One cap at 8 AM  And 8 PM   Glucagon, rDNA, (GLUCAGON EMERGENCY) 1 MG KIT Inject 1 mg into the muscle as directed.   HUMULIN 70/30 (70-30) 100 UNIT/ML injection SMARTSIG:15 Unit(s) SUB-Q Twice Daily   hydrOXYzine (ATARAX/VISTARIL) 10 MG tablet TAKE 1 Tablet BY  MOUTH 3 TIMES DAILY AS NEEDED FOR ITCHING   INSULIN SYRINGE .5CC/29G 29G X 1/2" 0.5 ML MISC Use to inject insulin twice daily.   liraglutide (VICTOZA) 18 MG/3ML SOPN Inject into the skin.   metFORMIN (GLUCOPHAGE XR) 500 MG 24 hr tablet Take 1 tablet (500 mg total) by mouth daily with breakfast.   Multiple Vitamin (MULTIVITAMIN WITH MINERALS) TABS tablet Take 1 tablet by mouth daily.   naltrexone (DEPADE) 50 MG tablet Take 50 mg by mouth daily.   ondansetron (ZOFRAN) 4 MG tablet Take 1 tablet by mouth every 8 (eight) hours as needed.   pantoprazole (PROTONIX) 40 MG tablet Take 40 mg by mouth daily.   spironolactone (ALDACTONE) 100 MG tablet Take 100 mg by mouth daily.   traZODone (DESYREL) 50 MG tablet Take 1 tablet (50 mg total) by mouth at bedtime as needed for sleep.   [DISCONTINUED] HUMULIN R 100 UNIT/ML injection SMARTSIG:8 Unit(s) SUB-Q Daily   [DISCONTINUED] metFORMIN (GLUCOPHAGE) 500 MG tablet Take 500 mg by mouth 2 (two) times daily.   [DISCONTINUED] acamprosate (CAMPRAL) 333 MG tablet Take 2 tablets (666 mg total) by mouth 3 (three) times daily with meals. (Patient not  taking: Reported on 11/04/2021)   [DISCONTINUED] busPIRone (BUSPAR) 15 MG tablet Take 1 tablet (15 mg total) by mouth 2 (two) times daily. (Patient not taking: Reported on 11/04/2021)   [DISCONTINUED] nicotine (NICODERM CQ - DOSED IN MG/24 HOURS) 21 mg/24hr patch Place 1 patch (21 mg total) onto the skin daily. (Patient not taking: Reported on 11/04/2021)   [DISCONTINUED] sertraline (ZOLOFT) 50 MG tablet Take 1 tablet (50 mg total) by mouth at bedtime. (Patient not taking: Reported on 11/04/2021)   No facility-administered encounter medications on file as of 11/04/2021.    ALLERGIES: No Known Allergies  VACCINATION STATUS:  There is no immunization history on file for this patient.  Diabetes She presents for her initial diabetic visit. She has type 2 diabetes mellitus. Onset time: Diagnosed at approx age of 60. Her disease course has been fluctuating. Hypoglycemia symptoms include sweats and tremors. Associated symptoms include foot paresthesias. There are no hypoglycemic complications. Symptoms are stable. Diabetic complications include nephropathy and peripheral neuropathy. Risk factors for coronary artery disease include diabetes mellitus, dyslipidemia, family history, hypertension, tobacco exposure and sedentary lifestyle. Current diabetic treatment includes intensive insulin program and oral agent (monotherapy) (and Victoza). She is compliant with treatment most of the time. Her weight is stable. She is following a generally unhealthy diet. When asked about meal planning, she reported none. She has not had a previous visit with a dietitian. She participates in exercise intermittently. Her home blood glucose trend is fluctuating minimally. Her breakfast blood glucose range is generally 180-200 mg/dl. Her lunch blood glucose range is generally >200 mg/dl. Her dinner blood glucose range is generally >200 mg/dl. Her bedtime blood glucose range is generally 180-200 mg/dl. (She presents today for her  consultation with her Dexcom CGM, no logs, showing slightly above target fasting and postprandial glycemic profile.  Her most recent A1c from 6/30 was 6.1%.  Unable to check A1c here today due to technical difficulties.  She routinely monitors glucose 4 times daily using her CGM.  She admits to skipping breakfast on most days due to nausea, eats lunch and supper along with snacks in between.  She drinks mostly water and iced coffee either with cream and sugar or Glucerna mixed in.  She does also admit to consuming alcohol 2-3 times per week.  She was walking  prior to her move to [place], and plans to restart once she gets settled in.  She is UTD on eye exam.  Analysis of her CGM shows TIR 63%, TAR 34%, TBR < 2%.) An ACE inhibitor/angiotensin II receptor blocker is not being taken. She does not see a podiatrist.Eye exam is current.  Hypertension This is a chronic problem. The current episode started more than 1 year ago. The problem has been resolved since onset. The problem is controlled. Associated symptoms include sweats. There are no associated agents to hypertension. Risk factors for coronary artery disease include diabetes mellitus, dyslipidemia, smoking/tobacco exposure, family history and sedentary lifestyle. Past treatments include diuretics. The current treatment provides mild improvement. Compliance problems include diet, exercise and psychosocial issues.   Hyperlipidemia This is a chronic problem. The current episode started more than 1 year ago. The problem is uncontrolled. Recent lipid tests were reviewed and are high. Exacerbating diseases include diabetes. Factors aggravating her hyperlipidemia include smoking and fatty foods. She is currently on no antihyperlipidemic treatment. Compliance problems include adherence to diet, adherence to exercise and psychosocial issues.  Risk factors for coronary artery disease include diabetes mellitus, dyslipidemia, family history, hypertension and a  sedentary lifestyle.    Review of systems  Constitutional: + Minimally fluctuating body weight, current Body mass index is 32.36 kg/m., no fatigue, no subjective hyperthermia, no subjective hypothermia Eyes: no blurry vision, no xerophthalmia ENT: no sore throat, no nodules palpated in throat, no dysphagia/odynophagia, no hoarseness Cardiovascular: no chest pain, no shortness of breath, no palpitations, no leg swelling Respiratory: chronic cough-smoker, no shortness of breath Gastrointestinal: no vomiting/diarrhea, + nausea (especially in the morning) Musculoskeletal: no muscle/joint aches Skin: no rashes, no hyperemia Neurological: no tremors, no numbness, no tingling, no dizziness Psychiatric: hx depression- controlled on meds, no anxiety  Objective:     BP 126/74   Pulse (!) 102   Ht 5' 4.25" (1.632 m)   Wt 190 lb (86.2 kg)   BMI 32.36 kg/m   Wt Readings from Last 3 Encounters:  11/04/21 190 lb (86.2 kg)  06/17/15 145 lb 1 oz (65.8 kg)     BP Readings from Last 3 Encounters:  11/04/21 126/74  06/21/15 129/71     Physical Exam- Limited  Constitutional:  Body mass index is 32.36 kg/m. , not in acute distress, normal state of mind Eyes:  EOMI, no exophthalmos Neck: Supple Cardiovascular: RRR, no murmurs, rubs, or gallops, no edema Respiratory: Adequate breathing efforts, no crackles, rales, rhonchi, or wheezing Musculoskeletal: no gross deformities, strength intact in all four extremities, no gross restriction of joint movements Skin:  no rashes, no hyperemia, nicotinic discoloration to fingernails Neurological: no tremor with outstretched hands    CMP ( most recent) CMP     Component Value Date/Time   NA 136 06/20/2015 0420   K 3.6 06/20/2015 0420   CL 105 06/20/2015 0420   CO2 23 06/20/2015 0420   GLUCOSE 141 (H) 06/20/2015 0420   BUN 7 06/20/2015 0420   CREATININE 0.42 (L) 06/20/2015 0420   CALCIUM 8.7 (L) 06/20/2015 0420   PROT 6.2 (L) 06/19/2015 0339    ALBUMIN 3.1 (L) 06/19/2015 0339   AST 46 (H) 06/19/2015 0339   ALT 24 06/19/2015 0339   ALKPHOS 150 (H) 06/19/2015 0339   BILITOT 0.9 06/19/2015 0339   GFRNONAA >60 06/20/2015 0420   GFRAA >60 06/20/2015 0420     Diabetic Labs (most recent): Lab Results  Component Value Date   HGBA1C 6.1 06/24/2021  HGBA1C 5.6 06/23/2015     Lipid Panel ( most recent) Lipid Panel     Component Value Date/Time   TRIG 123 05/21/2021 0000   [place] 135 05/21/2021 0000      Lab Results  Component Value Date   TSH 1.036 06/22/2015           Assessment & Plan:   1) Type 2 diabetes mellitus without complication, with long-term current use of insulin ([place])  She presents today for her consultation with her Dexcom CGM, no logs, showing slightly above target fasting and postprandial glycemic profile.  Her most recent A1c from 6/30 was 6.1%.  Unable to check A1c here today due to technical difficulties.  She routinely monitors glucose 4 times daily using her CGM.  She admits to skipping breakfast on most days due to nausea, eats lunch and supper along with snacks in between.  She drinks mostly water and iced coffee either with cream and sugar or Glucerna mixed in.  She does also admit to consuming alcohol 2-3 times per week.  She was walking prior to her move to [place], and plans to restart once she gets settled in.  She is UTD on eye exam.  Analysis of her CGM shows TIR 63%, TAR 34%, TBR < 2%.  She is a retired [employer] scientist.  - Michelle Michael has currently uncontrolled symptomatic type 2 DM since 60 years of age, with most recent A1c of 6.1 %.   -Recent labs reviewed.  - I had a long discussion with her about the progressive nature of diabetes and the pathology behind its complications. -her diabetes is complicated by smoking, peripheral neuropathy and she remains at a high risk for more acute and chronic complications which include CAD, CVA, CKD, retinopathy, and neuropathy. These are all  discussed in detail with her.  - I have counseled her on diet and weight management by adopting a carbohydrate restricted/protein rich diet. Patient is encouraged to switch to unprocessed or minimally processed complex starch and increased protein intake (animal or plant source), fruits, and vegetables. -  she is advised to stick to a routine mealtimes to eat 3 meals a day and avoid unnecessary snacks (to snack only to correct hypoglycemia).   - she acknowledges that there is a room for improvement in her food and drink choices. - Suggestion is made for her to avoid simple carbohydrates from her diet including Cakes, Sweet Desserts, Ice Cream, Soda (diet and regular), Sweet Tea, Candies, Chips, Cookies, Store Bought Juices, Alcohol in Excess of 1-2 drinks a day, Artificial Sweeteners, Coffee Creamer, and "Sugar-free" Products. This will help patient to have more stable blood glucose profile and potentially avoid unintended weight gain.  - she will be scheduled with Jearld Fenton, RDN, CDE for diabetes education.  - I have approached her with the following individualized plan to manage her diabetes and patient agrees:   -Given her past of heavy alcohol abuse, it is possible that she may solely require insulin treatment to control her diabetes due to insult to the pancreas.  -She is advised to continue her premixed insulin 70/30 at 15 units SQ twice daily with meals if glucose is above 90 and she is eating.  -she is encouraged to start monitoring glucose 4 times daily, before meals and before bed, to log their readings on the clinic sheets provided, and bring them to review at follow up appointment in 2 weeks.  - she is warned not to take insulin without proper monitoring per  orders. - Adjustment parameters are given to her for hypo and hyperglycemia in writing. - she is encouraged to call clinic for blood glucose levels less than 70 or above 300 mg /dl.  - she is advised to continue Metformin  500 mg (will switch to ER form po daily with breakfast given her complaint of nausea), therapeutically suitable for patient. - her Novolin R will be discontinued for now, risk outweighs benefit for this patient.  -She is already on Victoza 1.8 mg SQ daily, will be allowed to continue this for now.  She is not an ideal candidate for incretin therapy due to her heavy smoking history increasing her risk of pancreatitis.  - Specific targets for  A1c; LDL, HDL, and Triglycerides were discussed with the patient.  2) Blood Pressure /Hypertension:  her blood pressure is controlled to target.   she is advised to continue her current medications including Lasix 40 mg po daily, Spironolactone 100 mg p.o. daily with breakfast.  3) Lipids/Hyperlipidemia:    Review of her recent lipid panel from 05/21/21 showed uncontrolled LDL at 135 . She is not currently on any lipid lowering medications. Will discuss adding statin at next visit.  4)  Weight/Diet:  her Body mass index is 32.36 kg/m.  -  clearly complicating her diabetes care.   she is a candidate for weight loss. I discussed with her the fact that loss of 5 - 10% of her  current body weight will have the most impact on her diabetes management.  Exercise, and detailed carbohydrates information provided  -  detailed on discharge instructions.  5) Chronic Care/Health Maintenance: -she is not on ACEI/ARB or Statin medications and is encouraged to initiate and continue to follow up with Ophthalmology, Dentist, Podiatrist at least yearly or according to recommendations, and advised to [place]. I have recommended yearly flu vaccine and pneumonia vaccine at least every 5 years; moderate intensity exercise for up to 150 minutes weekly; and sleep for at least 7 hours a day.  - she is advised to maintain close follow up with Kang-Oh, Nelle Don, DO for primary care needs, as well as her other providers for optimal and coordinated care.   - Time spent in this  patient care: 60 min, of which > 50% was spent in counseling her about her diabetes and the rest reviewing her blood glucose logs, discussing her hypoglycemia and hyperglycemia episodes, reviewing her current and previous labs/studies (including abstraction from other facilities) and medications doses and developing a long term treatment plan based on the latest standards of care/guidelines; and documenting her care.    Please refer to Patient Instructions for Blood Glucose Monitoring and Insulin/Medications Dosing Guide" in media tab for additional information. Please also refer to "Patient Self Inventory" in the Media tab for reviewed elements of pertinent patient history.  [name] participated in the discussions, expressed understanding, and voiced agreement with the above plans.  All questions were answered to her satisfaction. she is encouraged to contact clinic should she have any questions or concerns prior to her return visit.     Follow up plan: - Return in about 2 weeks (around 11/18/2021) for Diabetes F/U, Bring meter and logs.    Rayetta Pigg, [facility] [address] [place], [place] [address] Phone: [phone] Fax: [phone]  11/04/2021, 2:50 PM

## 2021-11-23 ENCOUNTER — Ambulatory Visit: Payer: Self-pay | Admitting: Nutrition

## 2021-11-23 ENCOUNTER — Other Ambulatory Visit: Payer: Self-pay

## 2021-11-23 ENCOUNTER — Encounter: Payer: Self-pay | Admitting: Nurse Practitioner

## 2021-11-23 ENCOUNTER — Ambulatory Visit (INDEPENDENT_AMBULATORY_CARE_PROVIDER_SITE_OTHER): Payer: Medicaid Other | Admitting: Nurse Practitioner

## 2021-11-23 VITALS — BP 160/74 | HR 101 | Ht 64.25 in | Wt 195.0 lb

## 2021-11-23 DIAGNOSIS — E782 Mixed hyperlipidemia: Secondary | ICD-10-CM | POA: Diagnosis not present

## 2021-11-23 DIAGNOSIS — Z794 Long term (current) use of insulin: Secondary | ICD-10-CM

## 2021-11-23 DIAGNOSIS — I10 Essential (primary) hypertension: Secondary | ICD-10-CM | POA: Diagnosis not present

## 2021-11-23 DIAGNOSIS — E119 Type 2 diabetes mellitus without complications: Secondary | ICD-10-CM

## 2021-11-23 LAB — POCT GLYCOSYLATED HEMOGLOBIN (HGB A1C): HbA1c, POC (controlled diabetic range): 5.8 % (ref 0.0–7.0)

## 2021-11-23 NOTE — Progress Notes (Signed)
Endocrinology Follow Up Note       11/23/2021, 2:00 PM   Subjective:    Patient ID: Michelle Michael, female    DOB: 04-Nov-1961.  Michelle Michael is being seen in follow up after being in seen in consultation for management of currently uncontrolled symptomatic diabetes requested by  Michelle Pancoast E, DO.   Past Medical History:  Diagnosis Date   Anemia    Cirrhosis ([place])    CKD (chronic kidney disease)    Depression    Diabetes mellitus, type II ([place])    Hyperlipidemia    Hypertension    Hypokalemia    Hyponatremia     Past Surgical History:  Procedure Laterality Date   CHOLECYSTECTOMY     ORTHOPEDIC SURGERY      Social History   Socioeconomic History   Marital status: Legally Separated    Spouse name: Not on file   Number of children: Not on file   Years of education: Not on file   Highest education level: Not on file  Occupational History   Not on file  Tobacco Use   Smoking status: Every Day    Packs/day: 1.00    Types: Cigarettes   Smokeless tobacco: Never  Vaping Use   Vaping Use: Former  Substance and Sexual Activity   Alcohol use: Yes    Comment: "2 quartz of beer a day"   Drug use: No   Sexual activity: Not on file  Other Topics Concern   Not on file  Social History Narrative   Not on file   Social Determinants of Health   Financial Resource Strain: Not on file  Food Insecurity: Not on file  Transportation Needs: Not on file  Physical Activity: Not on file  Stress: Not on file  Social Connections: Not on file    Family History  Problem Relation Age of Onset   Diabetes Mother    Hypertension Mother    Hypertension Father     Outpatient Encounter Medications as of 11/23/2021  Medication Sig   albuterol (VENTOLIN HFA) 108 (90 Base) MCG/ACT inhaler Inhale into the lungs.   BD PEN NEEDLE NANO 2ND GEN 32G X 4 MM MISC USE 1 PEN NEEDLE ONCE DAILY   busPIRone (BUSPAR) 10 MG  tablet One tab at 8 Am - 1 PM - 8 PM   Continuous Blood Gluc Receiver ([place]) DEVI by Does not apply route.   Continuous Blood Gluc Sensor (DEXCOM G6 SENSOR) MISC SMARTSIG:1 Topical Every 10 Days   Continuous Blood Gluc Transmit (DEXCOM G6 TRANSMITTER) MISC USE EVERY 3 MONTHS   DULoxetine (CYMBALTA) 60 MG capsule Take 60 mg by mouth every morning.   fluticasone (FLONASE) 50 MCG/ACT nasal spray Place 2 sprays into both nostrils daily.   folic acid (FOLVITE) 1 MG tablet Take by mouth.   furosemide (LASIX) 40 MG tablet Take 40 mg by mouth daily.   gabapentin (NEURONTIN) 300 MG capsule One cap at 8 AM  And 8 PM   Glucagon, rDNA, (GLUCAGON EMERGENCY) 1 MG KIT Inject 1 mg into the muscle as directed.   HUMULIN 70/30 (70-30) 100 UNIT/ML injection Inject 10 Units into the skin 2 (two)  times daily with a meal.   hydrOXYzine (ATARAX/VISTARIL) 10 MG tablet TAKE 1 Tablet BY MOUTH 3 TIMES DAILY AS NEEDED FOR ITCHING   INSULIN SYRINGE .5CC/29G 29G X 1/2" 0.5 ML MISC Use to inject insulin twice daily.   liraglutide (VICTOZA) 18 MG/3ML SOPN Inject into the skin.   metFORMIN (GLUCOPHAGE XR) 500 MG 24 hr tablet Take 1 tablet (500 mg total) by mouth daily with breakfast.   Multiple Vitamin (MULTIVITAMIN WITH MINERALS) TABS tablet Take 1 tablet by mouth daily.   naltrexone (DEPADE) 50 MG tablet Take 50 mg by mouth daily.   ondansetron (ZOFRAN) 4 MG tablet Take 1 tablet by mouth every 8 (eight) hours as needed.   pantoprazole (PROTONIX) 40 MG tablet Take 40 mg by mouth daily.   spironolactone (ALDACTONE) 100 MG tablet Take 100 mg by mouth daily.   traZODone (DESYREL) 50 MG tablet Take 1 tablet (50 mg total) by mouth at bedtime as needed for sleep.   No facility-administered encounter medications on file as of 11/23/2021.    ALLERGIES: No Known Allergies  VACCINATION STATUS:  There is no immunization history on file for this patient.  Diabetes She presents for her follow-up diabetic visit.  She has type 2 diabetes mellitus. Onset time: Diagnosed at approx age of 32. Her disease course has been improving. Hypoglycemia symptoms include sweats and tremors. Associated symptoms include foot paresthesias. There are no hypoglycemic complications. Symptoms are stable. Diabetic complications include nephropathy and peripheral neuropathy. Risk factors for coronary artery disease include diabetes mellitus, dyslipidemia, family history, hypertension, tobacco exposure and sedentary lifestyle. Current diabetic treatment includes oral agent (monotherapy) and insulin injections (and Victoza). She is compliant with treatment most of the time. Her weight is fluctuating minimally. She is following a generally healthy diet. When asked about meal planning, she reported none. She has not had a previous visit with a dietitian. She participates in exercise intermittently. Her home blood glucose trend is decreasing steadily. Her breakfast blood glucose range is generally 140-180 mg/dl. Her lunch blood glucose range is generally 140-180 mg/dl. Her dinner blood glucose range is generally 140-180 mg/dl. Her bedtime blood glucose range is generally 140-180 mg/dl. Her overall blood glucose range is 140-180 mg/dl. (She presents today with her CGM, no logs, showing improving glycemic profile overall with some tight numbers overnight.  Her POCT A1c today is 5.8%, improving from previous visit of 6.1%.  She has had less hypoglycemia since stopping the Novolin R.  Analysis of her CGM shows TIR 83%, TAR 15%, TBR < 2%.  She denies any significant hypoglycemia since last visit.) An ACE inhibitor/angiotensin II receptor blocker is not being taken. She does not see a podiatrist.Eye exam is current.  Hypertension This is a chronic problem. The current episode started more than 1 year ago. The problem has been waxing and waning since onset. The problem is uncontrolled. Associated symptoms include sweats. There are no associated agents to  hypertension. Risk factors for coronary artery disease include diabetes mellitus, dyslipidemia, smoking/tobacco exposure, family history and sedentary lifestyle. Past treatments include diuretics. The current treatment provides mild improvement. Compliance problems include diet, exercise and psychosocial issues.   Hyperlipidemia This is a chronic problem. The current episode started more than 1 year ago. The problem is uncontrolled. Recent lipid tests were reviewed and are high. Exacerbating diseases include diabetes. Factors aggravating her hyperlipidemia include smoking and fatty foods. She is currently on no antihyperlipidemic treatment. Compliance problems include adherence to diet, adherence to exercise and psychosocial issues.  Risk factors for coronary artery disease include diabetes mellitus, dyslipidemia, family history, hypertension and a sedentary lifestyle.    Review of systems  Constitutional: + Minimally fluctuating body weight, current Body mass index is 33.21 kg/m., no fatigue, no subjective hyperthermia, no subjective hypothermia Eyes: no blurry vision, no xerophthalmia ENT: no sore throat, no nodules palpated in throat, no dysphagia/odynophagia, no hoarseness Cardiovascular: no chest pain, no shortness of breath, no palpitations, no leg swelling Respiratory: chronic cough-smoker, no shortness of breath Gastrointestinal: no vomiting/diarrhea, no nausea (has improved since changing Metformin to ER form) Musculoskeletal: no muscle/joint aches Skin: no rashes, no hyperemia Neurological: no tremors, + numbness/tingling to BLE, no dizziness Psychiatric: hx depression- controlled on meds, no anxiety  Objective:     BP (!) 160/74   Pulse (!) 101   Ht 5' 4.25" (1.632 m)   Wt 195 lb (88.5 kg)   BMI 33.21 kg/m   Wt Readings from Last 3 Encounters:  11/23/21 195 lb (88.5 kg)  11/04/21 190 lb (86.2 kg)  06/17/15 145 lb 1 oz (65.8 kg)     BP Readings from Last 3 Encounters:   11/23/21 (!) 160/74  11/04/21 126/74  06/21/15 129/71     Physical Exam- Limited  Constitutional:  Body mass index is 33.21 kg/m. , not in acute distress, normal state of mind Eyes:  EOMI, no exophthalmos Neck: Supple Cardiovascular: RRR, no murmurs, rubs, or gallops, no edema Respiratory: Adequate breathing efforts, no crackles, rales, rhonchi, or wheezing Musculoskeletal: no gross deformities, strength intact in all four extremities, no gross restriction of joint movements Skin:  no rashes, no hyperemia, nicotinic discoloration to fingernails Neurological: no tremor with outstretched hands   POCT ABI Results 11/23/21   Right ABI:  1.09      Left ABI:  1.14  Right leg systolic / diastolic: 595/63 mmHg Left leg systolic / diastolic: 875/64 mmHg  Arm systolic / diastolic: 332/95 mmHG  Detailed report will be scanned into patient chart.   CMP ( most recent) CMP     Component Value Date/Time   NA 136 06/20/2015 0420   K 3.6 06/20/2015 0420   CL 105 06/20/2015 0420   CO2 23 06/20/2015 0420   GLUCOSE 141 (H) 06/20/2015 0420   BUN 7 06/20/2015 0420   CREATININE 0.42 (L) 06/20/2015 0420   CALCIUM 8.7 (L) 06/20/2015 0420   PROT 6.2 (L) 06/19/2015 0339   ALBUMIN 3.1 (L) 06/19/2015 0339   AST 46 (H) 06/19/2015 0339   ALT 24 06/19/2015 0339   ALKPHOS 150 (H) 06/19/2015 0339   BILITOT 0.9 06/19/2015 0339   GFRNONAA >60 06/20/2015 0420   GFRAA >60 06/20/2015 0420     Diabetic Labs (most recent): Lab Results  Component Value Date   HGBA1C 5.8 11/23/2021   HGBA1C 6.1 06/24/2021   HGBA1C 5.6 06/23/2015     Lipid Panel ( most recent) Lipid Panel     Component Value Date/Time   TRIG 123 05/21/2021 0000   LDLCALC 135 05/21/2021 0000      Lab Results  Component Value Date   TSH 1.036 06/22/2015           Assessment & Plan:   1) Type 2 diabetes mellitus without complication, with long-term current use of insulin ([place])  She presents today with her CGM,  no logs, showing improving glycemic profile overall with some tight numbers overnight.  Her POCT A1c today is 5.8%, improving from previous visit of 6.1%.  She has had less hypoglycemia  since stopping the Novolin R.  Analysis of her CGM shows TIR 83%, TAR 15%, TBR < 2%.  She denies any significant hypoglycemia since last visit.  - Jaziah Kwasnik has currently uncontrolled symptomatic type 2 DM since 60 years of age.  -Recent labs reviewed.  - I had a long discussion with her about the progressive nature of diabetes and the pathology behind its complications. -her diabetes is complicated by smoking, peripheral neuropathy and she remains at a high risk for more acute and chronic complications which include CAD, CVA, CKD, retinopathy, and neuropathy. These are all discussed in detail with her.  - Nutritional counseling repeated at each appointment due to patients tendency to fall back in to old habits.  - The patient admits there is a room for improvement in their diet and drink choices. -  Suggestion is made for the patient to avoid simple carbohydrates from their diet including Cakes, Sweet Desserts / Pastries, Ice Cream, Soda (diet and regular), Sweet Tea, Candies, Chips, Cookies, Sweet Pastries, Store Bought Juices, Alcohol in Excess of 1-2 drinks a day, Artificial Sweeteners, Coffee Creamer, and "Sugar-free" Products. This will help patient to have stable blood glucose profile and potentially avoid unintended weight gain.   - I encouraged the patient to switch to unprocessed or minimally processed complex starch and increased protein intake (animal or plant source), fruits, and vegetables.   - Patient is advised to stick to a routine mealtimes to eat 3 meals a day and avoid unnecessary snacks (to snack only to correct hypoglycemia).  - she will be scheduled with Jearld Fenton, RDN, CDE for diabetes education.  Has an appt with her in December.  - I have approached her with the following  individualized plan to manage her diabetes and patient agrees:   -Given her past of heavy alcohol abuse, it is possible that she may solely require insulin treatment to control her diabetes due to insult to the pancreas.  -Based on her tightening glycemic profile, she is advised to lower her premixed insulin of 70/30 to 10 units BID with meals if glucose is above 90 and she is eating.  She can continue Metformin 500 mg ER daily with breakfast and Victoza 1.8 mg SQ weekly.   -she is encouraged to continue using her CGM to monitor her glucose 4 times daily, before meals and before bed, and to call the clinic if she has readings less than 70 or above 200 for 3 tests in a row.    - she is warned not to take insulin without proper monitoring per orders. - Adjustment parameters are given to her for hypo and hyperglycemia in writing.  - She is not an ideal candidate for incretin therapy due to her heavy smoking history increasing her risk of pancreatitis but since she has already been on Victoza for quite some time, will allow her to continue it.  We did discuss the s/s of pancreatitis to watch for.  - Specific targets for  A1c; LDL, HDL, and Triglycerides were discussed with the patient.  2) Blood Pressure /Hypertension:  her blood pressure is not controlled to target today.   she is advised to continue her current medications including Lasix 40 mg po daily, Spironolactone 100 mg p.o. daily with breakfast.  3) Lipids/Hyperlipidemia:    Review of her recent lipid panel from 05/21/21 showed uncontrolled LDL at 135 . She is not currently on any lipid lowering medications.   4)  Weight/Diet:  her Body mass index is  33.21 kg/m.  -  clearly complicating her diabetes care.   she is a candidate for weight loss. I discussed with her the fact that loss of 5 - 10% of her  current body weight will have the most impact on her diabetes management.  Exercise, and detailed carbohydrates information provided  -   detailed on discharge instructions.  5) Chronic Care/Health Maintenance: -she is not on ACEI/ARB or Statin medications and is encouraged to initiate and continue to follow up with Ophthalmology, Dentist, Podiatrist at least yearly or according to recommendations, and advised to [place]. I have recommended yearly flu vaccine and pneumonia vaccine at least every 5 years; moderate intensity exercise for up to 150 minutes weekly; and sleep for at least 7 hours a day.  - she is advised to maintain close follow up with Kang-Oh, Nelle Don, DO for primary care needs, as well as her other providers for optimal and coordinated care.     I spent 35 minutes in the care of the patient today including review of labs from [place], Lipids, Thyroid Function, Hematology (current and previous including abstractions from other facilities); face-to-face time discussing  her blood glucose readings/logs, discussing hypoglycemia and hyperglycemia episodes and symptoms, medications doses, her options of short and long term treatment based on the latest standards of care / guidelines;  discussion about incorporating lifestyle medicine;  and documenting the encounter.    Please refer to Patient Instructions for Blood Glucose Monitoring and Insulin/Medications Dosing Guide"  in media tab for additional information. Please  also refer to " Patient Self Inventory" in the Media  tab for reviewed elements of pertinent patient history.  Michelle Michael participated in the discussions, expressed understanding, and voiced agreement with the above plans.  All questions were answered to her satisfaction. she is encouraged to contact clinic should she have any questions or concerns prior to her return visit.     Follow up plan: - Return in about 4 months (around 03/23/2022) for Diabetes F/U with A1c in office, No previsit labs, Bring meter and logs.    Rayetta Pigg, [facility] [address] [place], [place] [address] Phone: [phone] Fax: [phone]  11/23/2021, 2:00 PM

## 2021-11-23 NOTE — Patient Instructions (Signed)

## 2021-11-30 ENCOUNTER — Telehealth: Payer: Self-pay | Admitting: Nurse Practitioner

## 2021-11-30 NOTE — Telephone Encounter (Signed)
Have her stop the Metformin altogether for now and see if it helps her diarrhea.  We can adjust her other diabetes medications if needed to help control her glucose if it rebounds.  Just have her reach back out with readings in a week or so.

## 2021-11-30 NOTE — Telephone Encounter (Signed)
Pt left a voicemail requesting a call back from nurse in regards to the metformin ER possibly causing her GI problems. CB# [phone]

## 2021-11-30 NOTE — Telephone Encounter (Signed)
Pt left message that she got it working and it states we are connected and sharing dexcom data.

## 2021-11-30 NOTE — Telephone Encounter (Signed)
Called patient and she stated that she is having severe diarrhea and has become worse since starting the Metformin XR. Patient stated that she was having bad nausea with the regular Metformin. Patient has tried Lomotil and not helping her diarrhea. Patient states she is having up to 5 times daily and gets worse when she eats. Patient is asking what does she need to do? Please advise.

## 2021-11-30 NOTE — Telephone Encounter (Signed)
Called patient and gave her the message from [place]. Patient verbalized an understanding and I sent her an invite to link her Dexcom so we can view her blood glucose readings. Patient will still call next week to let us know how she is doing.

## 2021-12-01 ENCOUNTER — Encounter (INDEPENDENT_AMBULATORY_CARE_PROVIDER_SITE_OTHER): Payer: Self-pay | Admitting: *Deleted

## 2021-12-08 NOTE — Telephone Encounter (Signed)
Can you call this patient? She does not understand why we can not see what she is sharing with [place]

## 2021-12-08 NOTE — Telephone Encounter (Signed)
Patient would like you to look at her dexcom readings. She said she was advised to call in a week.

## 2021-12-08 NOTE — Telephone Encounter (Signed)
I only have readings from 11/16-11/29, nothing more recent.  Has she been writing them down?

## 2021-12-09 ENCOUNTER — Ambulatory Visit: Payer: Self-pay | Admitting: Nutrition

## 2021-12-15 ENCOUNTER — Encounter: Payer: Medicare Other | Attending: Nurse Practitioner | Admitting: Nutrition

## 2021-12-16 ENCOUNTER — Ambulatory Visit: Payer: Self-pay | Admitting: Nutrition

## 2021-12-31 ENCOUNTER — Other Ambulatory Visit: Payer: Self-pay

## 2021-12-31 ENCOUNTER — Telehealth: Payer: Self-pay | Admitting: Nurse Practitioner

## 2021-12-31 MED ORDER — DEXCOM G6 TRANSMITTER MISC: 1 refills | Status: DC

## 2021-12-31 MED ORDER — DEXCOM G6 SENSOR MISC: 1 refills | Status: DC

## 2021-12-31 NOTE — Telephone Encounter (Signed)
Sent Dexcom sensors and transmitter to the pharmacy requested.

## 2021-12-31 NOTE — Telephone Encounter (Signed)
Pt states since she has been transferred and is seeing [place] for her diabetes now she is needing her dexcom supplies sent to her pharmacy because the pharmacy is sending her refill request to her previous doctor since they do not have anything on file from [place].   [facility] [address] [place] - [id number] [place] [address] Phone:  [phone]  Fax:  [phone]

## 2022-01-05 ENCOUNTER — Telehealth: Payer: Self-pay

## 2022-01-05 NOTE — Telephone Encounter (Signed)
Patient called back and stated her address that we have in the chart is correct. She lives on [address]. The address you provided from the pharmacy is an old address. I asked her to update the pharmacy. She said her insurance is now Medicare A & B and secondary is medicaid. She will bring the card by here tomorrow

## 2022-01-05 NOTE — Telephone Encounter (Signed)
Called patient and left a detailed voice message for patient to call back to confirm insurance and her address. We received a fax for a prior authorization for her Dexcom transmitter and sensors and [facility] has an address listed of  [address] [place] [address] And insurance of: [employer] with member ID [id number]

## 2022-01-06 NOTE — Telephone Encounter (Signed)
Patient is calling for an update. I told her we do have her cards on file

## 2022-01-06 NOTE — Telephone Encounter (Signed)
Called patient and left a detailed voice message that we are waiting for further paperwork and will work on what we have received.

## 2022-01-06 NOTE — Telephone Encounter (Signed)
Pt received a call from her [employer] insurance and states she was denied for her Dexcom. We received a PA form. I placed it in the nurse box.

## 2022-01-11 NOTE — Telephone Encounter (Signed)
Insurance risk surveyor have been approved from [employer]. I called her pharmacy today to have them update and have ready for patient.

## 2022-01-24 ENCOUNTER — Encounter: Payer: Medicare (Managed Care) | Attending: Nurse Practitioner | Admitting: Nutrition

## 2022-01-24 ENCOUNTER — Encounter: Payer: Self-pay | Admitting: Nutrition

## 2022-01-24 ENCOUNTER — Other Ambulatory Visit: Payer: Self-pay

## 2022-01-24 VITALS — Ht 65.0 in | Wt 184.4 lb

## 2022-01-24 DIAGNOSIS — E119 Type 2 diabetes mellitus without complications: Secondary | ICD-10-CM | POA: Insufficient documentation

## 2022-01-24 DIAGNOSIS — K7030 Alcoholic cirrhosis of liver without ascites: Secondary | ICD-10-CM

## 2022-01-24 DIAGNOSIS — E118 Type 2 diabetes mellitus with unspecified complications: Secondary | ICD-10-CM

## 2022-01-24 DIAGNOSIS — Z794 Long term (current) use of insulin: Secondary | ICD-10-CM | POA: Diagnosis not present

## 2022-01-24 NOTE — Progress Notes (Signed)
Medical Nutrition Therapy  Appointment Start time:  1045  Appointment End time:  1200  Primary concerns today: Dm Type 2  Referral diagnosis: E11.8 Preferred learning style: Read   Learning readiness: Ready  NUTRITION ASSESSMENT  Sees Ronny Bacon, FNP at [facility]. A1C 5.8% Is on Cymbalta for depression. Is working on trying to get appt with [facility]. Uses Dexcom; 85% TIR, 10% above range and 5% below range 70/30 insulin 10 units BID. Victoza daily. Metformin 500 mg-hasn't been taking it due to GI issues of chronic diarrhea. Willing to restart taking with with dinner to see if GI side effects go away. Has history of alcohol abuse and cirrhosis of liver. Chronic smoker- working on quitting. Getting patches soon.  Anthropometrics  Wt Readings from Last 3 Encounters:  01/24/22 184 lb 6.4 oz (83.6 kg)  11/23/21 195 lb (88.5 kg)  11/04/21 190 lb (86.2 kg)   Ht Readings from Last 3 Encounters:  01/24/22 5\' 5"  (1.651 m)  11/23/21 5' 4.25" (1.632 m)  11/04/21 5' 4.25" (1.632 m)   Body mass index is 30.69 kg/m. @BMIFA @ Facility age limit for growth percentiles is 20 years. Facility age limit for growth percentiles is 20 years. .   Clinical Medical Hx: HTN, DM Type 2, Cirrhosis of liver, alcohol abuse -in recovery, neuropathy. Medications: 70/30 10 units BID, Victoza and Metformin 500 mg a day. Labs.  Lab Results  Component Value Date   HGBA1C 5.8 11/23/2021    CMP Latest Ref Rng & Units 06/20/2015 06/19/2015 06/18/2015  Glucose 65 - 99 mg/dL 06/21/2015) 06/20/2015) 144(Y)  BUN 6 - 20 mg/dL 7 7 185(U)  Creatinine 314(H - 1.00 mg/dL <7(W) 2.63 7.85(Y  Sodium 135 - 145 mmol/L 136 139 138  Potassium 3.5 - 5.1 mmol/L 3.6 3.8 5.1  Chloride 101 - 111 mmol/L 105 108 108  CO2 22 - 32 mmol/L 23 23 21(L)  Calcium 8.9 - 10.3 mg/dL 8.50) 9.0 8.9  Total Protein 6.5 - 8.1 g/dL - 6.2(L) 5.9(L)  Total Bilirubin 0.3 - 1.2 mg/dL - 0.9 2.77)  Alkaline Phos 38 - 126 U/L - 150(H)  146(H)  AST 15 - 41 U/L - 46(H) 49(H)  ALT 14 - 54 U/L - 24 24   Lipid Panel     Component Value Date/Time   TRIG 123 05/21/2021 0000   LDLCALC 135 05/21/2021 0000   Notable Signs/Symptoms: none  Lifestyle & Dietary Hx LIves with a roommate. THey both share cooking and shoppping. Eats 2-3 meals per day. Doesn't get hungry sometimes.  Estimated daily fluid intake: 16 oz, SF tea 16 oz Supplements: MVI,  Sleep:  5 hours Stress / self-care: health Current average weekly physical activity: ADL   24-Hr Dietary Recall First Meal: Boiled eggs, 2, Glucerna  Snack:  Second Meal: skipped, water, unsweet tea Snack:  Third Meal: Pasta, parmesean cheese, 1 slice garlic bread, water/unsweet tea Snack: smart bar granola Beverages: water or unsweet tea  Estimated Energy Needs Calories: 1500 Carbohydrate: 170g Protein: 112g Fat: 42g   NUTRITION DIAGNOSIS  NB-1.1 Food and nutrition-related knowledge deficit As related to DIabetes Type 2.  As evidenced by A1C 5.8% and on insulin.05/23/2021   NUTRITION INTERVENTION  Nutrition education (E-1) on the following topics:  Nutrition and Diabetes education provided on My Plate, CHO counting, meal planning, portion sizes, timing of meals, avoiding snacks between meals unless having a low blood sugar, target ranges for A1C and blood sugars, signs/symptoms and treatment of hyper/hypoglycemia, monitoring blood sugars, taking medications  as prescribed, benefits of exercising 30 minutes per day and prevention of complications of DM. Lifestyle Medicine - Whole Food, Plant Predominant Nutrition is highly recommended: Eat Plenty of vegetables, Mushrooms, fruits, Legumes, Whole Grains, Nuts, seeds in lieu of processed meats, processed snacks/pastries red meat, poultry, eggs.    -It is better to avoid simple carbohydrates including: Cakes, Sweet Desserts, Ice Cream, Soda (diet and regular), Sweet Tea, Candies, Chips, Cookies, Store Bought Juices, Alcohol in Excess  of  1-2 drinks a day, Lemonade,  Artificial Sweeteners, Doughnuts, Coffee Creamers, "Sugar-free" Products, etc, etc.  This is not a complete list.....  Exercise: If you are able: 30 -60 minutes a day ,4 days a week, or 150 minutes a week.  The longer the better.  Combine stretch, strength, and aerobic activities.  If you were told in the past that you have high risk for cardiovascular diseases, you may seek evaluation by your heart doctor prior to initiating moderate to intense exercise programs.   Handouts Provided Include  My plant based meal plan Meal Plan Card Lifestyle nutrition  Learning Style & Readiness for Change Teaching method utilized: Visual & Auditory  Demonstrated degree of understanding via: Teach Back  Barriers to learning/adherence to lifestyle change: none  Goals Established by Pt Eat three meals per day Do not skip meals Eat 2 carb choices per meal Prevent low blood sugars below 70 mg/dl. Drink water-64 oz per day Call 1-800 Quit Now for smoking cessation supplies. Contact [facility] [employer] to see about knitting/crocheting classes to help you stay busy and not smoke Call [facility] if BS are less than 70 or over 200 2-3 times per week. Focus on more plant based foods to improve your health   MONITORING & EVALUATION Dietary intake, weekly physical activity, and eating habits and BS  in 4-6 months.  Next Steps  Patient is to work on quitting smoking and eating three meals per day.Marland Kitchen

## 2022-01-24 NOTE — Patient Instructions (Addendum)
Eat three meals per day Do not skip meals Eat 2 carb choices per meal Prevent low blood sugars below 70 mg/dl. Drink water-64 oz per day Call 1-800 Quit Now for smoking cessation supplies. Contact [facility] to see about knitting/crocheting classes to help you stay busy and not smoke Call [facility] if BS are less than 70 or over 200 2-3 times per week. Focus on more plant based foods to improve your health

## 2022-01-27 ENCOUNTER — Other Ambulatory Visit: Payer: Self-pay

## 2022-01-27 ENCOUNTER — Ambulatory Visit: Payer: Medicare Other | Admitting: Nurse Practitioner

## 2022-02-16 ENCOUNTER — Encounter (INDEPENDENT_AMBULATORY_CARE_PROVIDER_SITE_OTHER): Payer: Self-pay

## 2022-03-06 ENCOUNTER — Other Ambulatory Visit: Payer: Self-pay | Admitting: Nurse Practitioner

## 2022-03-23 ENCOUNTER — Encounter: Payer: Self-pay | Admitting: Nurse Practitioner

## 2022-03-23 ENCOUNTER — Other Ambulatory Visit: Payer: Self-pay

## 2022-03-23 ENCOUNTER — Ambulatory Visit: Payer: Medicare (Managed Care) | Admitting: Nutrition

## 2022-03-23 ENCOUNTER — Ambulatory Visit (INDEPENDENT_AMBULATORY_CARE_PROVIDER_SITE_OTHER): Payer: Medicare (Managed Care) | Admitting: Nurse Practitioner

## 2022-03-23 VITALS — BP 121/77 | HR 93 | Ht 64.25 in | Wt 188.0 lb

## 2022-03-23 DIAGNOSIS — E782 Mixed hyperlipidemia: Secondary | ICD-10-CM

## 2022-03-23 DIAGNOSIS — E119 Type 2 diabetes mellitus without complications: Secondary | ICD-10-CM | POA: Diagnosis not present

## 2022-03-23 DIAGNOSIS — I10 Essential (primary) hypertension: Secondary | ICD-10-CM | POA: Diagnosis not present

## 2022-03-23 DIAGNOSIS — Z794 Long term (current) use of insulin: Secondary | ICD-10-CM

## 2022-03-23 LAB — POCT GLYCOSYLATED HEMOGLOBIN (HGB A1C): HbA1c, POC (controlled diabetic range): 5.6 % (ref 0.0–7.0)

## 2022-03-23 NOTE — Patient Instructions (Signed)

## 2022-03-23 NOTE — Progress Notes (Signed)
? ?                                                    Endocrinology Follow Up Note  ?     03/23/2022, 11:42 AM ? ? ?Subjective:  ? ? Patient ID: Michelle Michael, female    DOB: July 10, 1961.  ?Michelle Michael is being seen in follow up after being in seen in consultation for management of currently uncontrolled symptomatic diabetes requested by  Renee Rival, FNP. ? ? ?Past Medical History:  ?Diagnosis Date  ? Anemia   ? Cirrhosis ([place])   ? CKD (chronic kidney disease)   ? Depression   ? Diabetes mellitus, type II ([place])   ? Hyperlipidemia   ? Hypertension   ? Hypokalemia   ? Hyponatremia   ? ? ?Past Surgical History:  ?Procedure Laterality Date  ? CHOLECYSTECTOMY    ? ORTHOPEDIC SURGERY    ? ? ?Social History  ? ?Socioeconomic History  ? Marital status: Divorced  ?  Spouse name: Not on file  ? Number of children: Not on file  ? Years of education: Not on file  ? Highest education level: Not on file  ?Occupational History  ? Not on file  ?Tobacco Use  ? Smoking status: Every Day  ?  Packs/day: 1.00  ?  Types: Cigarettes  ? Smokeless tobacco: Never  ?Vaping Use  ? Vaping Use: Former  ?Substance and Sexual Activity  ? Alcohol use: Yes  ?  Comment: "2 quartz of beer a day"  ? Drug use: No  ? Sexual activity: Not on file  ?Other Topics Concern  ? Not on file  ?Social History Narrative  ? Not on file  ? ?Social Determinants of Health  ? ?Financial Resource Strain: Not on file  ?Food Insecurity: Not on file  ?Transportation Needs: Not on file  ?Physical Activity: Not on file  ?Stress: Not on file  ?Social Connections: Not on file  ? ? ?Family History  ?Problem Relation Age of Onset  ? Diabetes Mother   ? Hypertension Mother   ? Hypertension Father   ? ? ?Outpatient Encounter Medications as of 03/23/2022  ?Medication Sig  ? albuterol (VENTOLIN HFA) 108 (90 Base) MCG/ACT inhaler Inhale into the lungs.  ? BD PEN NEEDLE NANO 2ND GEN 32G X 4 MM MISC USE 1 PEN NEEDLE ONCE DAILY  ? busPIRone (BUSPAR) 10 MG  tablet One tab at 8 Am - 1 PM - 8 PM  ? Continuous Blood Gluc Receiver ([place]) DEVI by Does not apply route.  ? Continuous Blood Gluc Sensor (DEXCOM G6 SENSOR) MISC Apply 1 sensor every 10 days.  ? Continuous Blood Gluc Transmit (DEXCOM G6 TRANSMITTER) MISC Use transmitter every 90 days.  ? DULoxetine (CYMBALTA) 60 MG capsule Take 60 mg by mouth every morning.  ? fluticasone (FLONASE) 50 MCG/ACT nasal spray Place 2 sprays into both nostrils daily.  ? folic acid (FOLVITE) 1 MG tablet Take by mouth.  ? furosemide (LASIX) 40 MG tablet Take 40 mg by mouth daily.  ? gabapentin (NEURONTIN) 300 MG capsule One cap at 8 AM  And 8 PM  ? Glucagon, rDNA, (GLUCAGON EMERGENCY) 1 MG KIT Inject 1 mg into the muscle as directed.  ? hydrOXYzine (ATARAX/VISTARIL) 10 MG tablet TAKE 1 Tablet BY MOUTH 3 TIMES DAILY  AS NEEDED FOR ITCHING  ? INSULIN SYRINGE .5CC/29G 29G X 1/2" 0.5 ML MISC Use to inject insulin twice daily.  ? liraglutide (VICTOZA) 18 MG/3ML SOPN Inject 1.8 mg into the skin daily.  ? metFORMIN (GLUCOPHAGE XR) 500 MG 24 hr tablet Take 1 tablet (500 mg total) by mouth daily with breakfast.  ? Multiple Vitamin (MULTIVITAMIN WITH MINERALS) TABS tablet Take 1 tablet by mouth daily.  ? naltrexone (DEPADE) 50 MG tablet Take 50 mg by mouth daily.  ? ondansetron (ZOFRAN) 4 MG tablet Take 1 tablet by mouth every 8 (eight) hours as needed.  ? pantoprazole (PROTONIX) 40 MG tablet Take 40 mg by mouth daily.  ? spironolactone (ALDACTONE) 100 MG tablet Take 100 mg by mouth daily.  ? traZODone (DESYREL) 50 MG tablet Take 1 tablet (50 mg total) by mouth at bedtime as needed for sleep.  ? [DISCONTINUED] HUMULIN 70/30 (70-30) 100 UNIT/ML injection Inject 10 Units into the skin 2 (two) times daily with a meal.  ? ?No facility-administered encounter medications on file as of 03/23/2022.  ? ? ?ALLERGIES: ?No Known Allergies ? ?VACCINATION STATUS: ? ?There is no immunization history on file for this patient. ? ?Diabetes ?She presents  for her follow-up diabetic visit. She has type 2 diabetes mellitus. Onset time: Diagnosed at approx age of 64. Her disease course has been improving. Hypoglycemia symptoms include sweats and tremors. Associated symptoms include foot paresthesias. Hypoglycemia complications include nocturnal hypoglycemia. Symptoms are stable. Diabetic complications include nephropathy and peripheral neuropathy. Risk factors for coronary artery disease include diabetes mellitus, dyslipidemia, family history, hypertension, tobacco exposure and sedentary lifestyle. Current diabetic treatment includes oral agent (monotherapy) and insulin injections (and Victoza). She is compliant with treatment most of the time. Her weight is fluctuating minimally. She is following a generally healthy diet. When asked about meal planning, she reported none. She has not had a previous visit with a dietitian. She participates in exercise intermittently. Her home blood glucose trend is fluctuating minimally. Her breakfast blood glucose range is generally 110-130 mg/dl. Her lunch blood glucose range is generally 110-130 mg/dl. Her dinner blood glucose range is generally 110-130 mg/dl. Her bedtime blood glucose range is generally 110-130 mg/dl. Her overall blood glucose range is 110-130 mg/dl. (She presents today with her CGM showing at target glycemic profile overall.  Her POCT A1c today is 5.6%, improving from last visit of 5.8%.  She reports quite frequent hypoglycemia during the overnight hours.  Analysis of her CGM shows TIR 90%, TAR 7%, TBR 3% with a GMI of 6.3%.  She did have diarrhea associated with her Metformin but when she stopped it, she noticed her glucose readings rising, so she restarted taking it every other day and she did not have the diarrhea to return.  She is back to taking it daily.) An ACE inhibitor/angiotensin II receptor blocker is not being taken. She does not see a podiatrist.Eye exam is current.  ?Hypertension ?This is a chronic  problem. The current episode started more than 1 year ago. The problem has been resolved since onset. The problem is controlled. Associated symptoms include sweats. There are no associated agents to hypertension. Risk factors for coronary artery disease include diabetes mellitus, dyslipidemia, smoking/tobacco exposure, family history and sedentary lifestyle. Past treatments include diuretics. The current treatment provides mild improvement. Compliance problems include diet, exercise and psychosocial issues.   ?Hyperlipidemia ?This is a chronic problem. The current episode started more than 1 year ago. The problem is uncontrolled. Recent lipid tests were  reviewed and are high. Exacerbating diseases include diabetes. Factors aggravating her hyperlipidemia include smoking and fatty foods. She is currently on no antihyperlipidemic treatment. Compliance problems include adherence to diet, adherence to exercise and psychosocial issues.  Risk factors for coronary artery disease include diabetes mellitus, dyslipidemia, family history, hypertension and a sedentary lifestyle.  ? ? ?Review of systems ? ?Constitutional: + Minimally fluctuating body weight, current Body mass index is 32.02 kg/m?., no fatigue, no subjective hyperthermia, no subjective hypothermia ?Eyes: no blurry vision, no xerophthalmia ?ENT: no sore throat, no nodules palpated in throat, no dysphagia/odynophagia, no hoarseness ?Cardiovascular: no chest pain, no shortness of breath, no palpitations, no leg swelling ?Respiratory: chronic cough-smoker, no shortness of breath ?Gastrointestinal: no vomiting/diarrhea ?Musculoskeletal: no muscle/joint aches ?Skin: no rashes, no hyperemia ?Neurological: no tremors, + numbness/tingling to BLE, no dizziness ?Psychiatric: hx depression- controlled on meds, no anxiety ? ?Objective:  ?  ? ?BP 121/77   Pulse 93   Ht 5' 4.25" (1.632 m)   Wt 188 lb (85.3 kg)   BMI 32.02 kg/m?   ?Wt Readings from Last 3 Encounters:   ?03/23/22 188 lb (85.3 kg)  ?01/24/22 184 lb 6.4 oz (83.6 kg)  ?11/23/21 195 lb (88.5 kg)  ?  ? ?BP Readings from Last 3 Encounters:  ?03/23/22 121/77  ?11/23/21 (!) 160/74  ?11/04/21 126/74  ?  ? ?Physical Exam- Limit

## 2022-03-24 ENCOUNTER — Other Ambulatory Visit: Payer: Self-pay | Admitting: Nurse Practitioner

## 2022-03-24 ENCOUNTER — Ambulatory Visit (INDEPENDENT_AMBULATORY_CARE_PROVIDER_SITE_OTHER): Payer: Self-pay | Admitting: Gastroenterology

## 2022-03-24 MED ORDER — METFORMIN HCL ER 500 MG PO TB24: 500.0000 mg | ORAL_TABLET | Freq: Every day | ORAL | 0 refills | Status: DC

## 2022-03-28 ENCOUNTER — Encounter: Payer: Self-pay | Admitting: Nurse Practitioner

## 2022-04-06 ENCOUNTER — Telehealth: Payer: Self-pay | Admitting: Nurse Practitioner

## 2022-04-06 ENCOUNTER — Encounter: Payer: Self-pay | Admitting: Nurse Practitioner

## 2022-04-06 NOTE — Telephone Encounter (Signed)
Patient said that she needs you to call [facility] [phone] and let them know about any existing prescriptions. Please Advise.  ?

## 2022-04-06 NOTE — Telephone Encounter (Signed)
Patient called and I let her know that we received a new prior authorization for her Dexcom Sensors and waiting on a response for the PA. ?

## 2022-04-06 NOTE — Telephone Encounter (Signed)
Called this patient and left a detailed voice message for her to return my call.  ? ?[facility] is ONLY for diabetic monitor supplies such as her Dexcom. If the patient has been receiving from [facility] with no problems she can continue using [facility] and we do not have to send to [facility]. ?

## 2022-04-07 ENCOUNTER — Encounter: Payer: Self-pay | Admitting: Nurse Practitioner

## 2022-04-07 ENCOUNTER — Other Ambulatory Visit: Payer: Self-pay

## 2022-04-07 MED ORDER — DEXCOM G6 SENSOR MISC
1 refills | Status: DC
Start: 2022-04-07 — End: 2022-11-25

## 2022-04-07 MED ORDER — DEXCOM G6 TRANSMITTER MISC: 1 refills | Status: DC

## 2022-04-07 NOTE — Telephone Encounter (Signed)
Just got this notification from the patient

## 2022-04-07 NOTE — Telephone Encounter (Signed)
I received a denial on Cover My Meds and sent an appeal letting them know that the patient has had frequent hypoglycemic episodes during the overnight hours. Waiting on a response. ?

## 2022-04-07 NOTE — Telephone Encounter (Signed)
Patient called back and stated that she has to pay [facility] $1100 before she can receive her Dexcom G6 Sensors and patient is not able to afford this. I let the patient know that I have sent in for an appeal of the prior authorization and we can see how much they will be at [facility] if the appeal is approved. Patient verbalized an understanding. ?

## 2022-04-08 DIAGNOSIS — E119 Type 2 diabetes mellitus without complications: Secondary | ICD-10-CM | POA: Diagnosis not present

## 2022-04-14 ENCOUNTER — Encounter: Payer: Self-pay | Admitting: Nurse Practitioner

## 2022-04-14 ENCOUNTER — Ambulatory Visit (INDEPENDENT_AMBULATORY_CARE_PROVIDER_SITE_OTHER): Payer: Medicare HMO | Admitting: Nurse Practitioner

## 2022-04-14 VITALS — BP 125/79 | HR 110 | Ht 65.0 in | Wt 188.0 lb

## 2022-04-14 DIAGNOSIS — Z794 Long term (current) use of insulin: Secondary | ICD-10-CM | POA: Diagnosis not present

## 2022-04-14 DIAGNOSIS — F411 Generalized anxiety disorder: Secondary | ICD-10-CM

## 2022-04-14 DIAGNOSIS — Z1231 Encounter for screening mammogram for malignant neoplasm of breast: Secondary | ICD-10-CM

## 2022-04-14 DIAGNOSIS — K7030 Alcoholic cirrhosis of liver without ascites: Secondary | ICD-10-CM | POA: Diagnosis not present

## 2022-04-14 DIAGNOSIS — E119 Type 2 diabetes mellitus without complications: Secondary | ICD-10-CM | POA: Diagnosis not present

## 2022-04-14 DIAGNOSIS — I10 Essential (primary) hypertension: Secondary | ICD-10-CM | POA: Diagnosis not present

## 2022-04-14 DIAGNOSIS — E785 Hyperlipidemia, unspecified: Secondary | ICD-10-CM | POA: Diagnosis not present

## 2022-04-14 DIAGNOSIS — K7460 Unspecified cirrhosis of liver: Secondary | ICD-10-CM | POA: Insufficient documentation

## 2022-04-14 DIAGNOSIS — F17200 Nicotine dependence, unspecified, uncomplicated: Secondary | ICD-10-CM | POA: Diagnosis not present

## 2022-04-14 MED ORDER — ONDANSETRON HCL 4 MG PO TABS: 4.0000 mg | ORAL_TABLET | Freq: Three times a day (TID) | ORAL | 0 refills | Status: DC | PRN

## 2022-04-14 MED ORDER — ALBUTEROL SULFATE HFA 108 (90 BASE) MCG/ACT IN AERS: 2.0000 | INHALATION_SPRAY | Freq: Four times a day (QID) | RESPIRATORY_TRACT | 3 refills | Status: DC | PRN

## 2022-04-14 NOTE — Assessment & Plan Note (Signed)
Followed by GI ?Has been having follow-up appointment every 4 months ?On Lasix and spironolactone ?She continues to drink alcohol ?Need to quit drinking discussed with patient she verbalized understanding ?

## 2022-04-14 NOTE — Progress Notes (Signed)
? ?New Patient Office Visit ? ?Subjective   ? ?Patient ID: Michelle Michael, female    DOB: December 10, 1961  Age: 61 y.o. MRN: [id number] ? ?CC:  ?Chief Complaint  ?Patient presents with  ? New Patient (Initial Visit)  ?  np  ? ? ?HPI ?[name] with PHM of GAD, alcoholic cirrhosis of the liver without ascites, GERD, T2DM, CKD stage 13, alcohol abuse   presents to establish care for her chronic medical conditions. Previous PCP Dr Alvin [facility]. ? ?Alcoholic cirrhosis .followed by GI at [facility],still drinks 4 glasses of wine a week states that she was drinking vodka before. Denies abdominal pain, abnormal bleeding, swelling in the stomach, needs referral to GI in [place]. She has been seeing GI every 4 months .  ? ?T2DM. Followed by endocrinology. Pt denies hypoglycemia, polyphagia, polyuria, last a1c was well controlled,on victpoza 1.54m daily injection states that she has resumed taking her 70/30 insulin which was previously stopped.  ? ?Current everyday smoker.smokes half a pack of cigarettes daily,  was previously doing one pack a day. she has quit of and on, started smoking at age 61 Not ready to quit today.  ? ?Last annual physical was in Dec 2021, Last PAP was in 2021.  ? ? ?Outpatient Encounter Medications as of 04/14/2022  ?Medication Sig  ? BD PEN NEEDLE NANO 2ND GEN 32G X 4 MM MISC USE 1 PEN NEEDLE ONCE DAILY  ? busPIRone (BUSPAR) 10 MG tablet One tab at 8 Am - 1 PM - 8 PM  ? Continuous Blood Gluc Receiver ([place] DEVI by Does not apply route.  ? Continuous Blood Gluc Sensor (DEXCOM G6 SENSOR) MISC Apply 1 sensor every 10 days.  ? Continuous Blood Gluc Transmit (DEXCOM G6 TRANSMITTER) MISC Use transmitter every 90 days.  ? DULoxetine (CYMBALTA) 60 MG capsule Take 60 mg by mouth every morning.  ? fluticasone (FLONASE) 50 MCG/ACT nasal spray Place 2 sprays into both nostrils daily.  ? furosemide (LASIX) 40 MG tablet Take 40 mg by mouth daily.  ? gabapentin (NEURONTIN) 300 MG capsule One cap  at 8 AM  And 8 PM  ? Glucagon, rDNA, (GLUCAGON EMERGENCY) 1 MG KIT Inject 1 mg into the muscle as directed.  ? hydrOXYzine (ATARAX/VISTARIL) 10 MG tablet TAKE 1 Tablet BY MOUTH 3 TIMES DAILY AS NEEDED FOR ITCHING  ? INSULIN SYRINGE .5CC/29G 29G X 1/2" 0.5 ML MISC Use to inject insulin twice daily.  ? liraglutide (VICTOZA) 18 MG/3ML SOPN Inject 1.8 mg into the skin daily.  ? metFORMIN (GLUCOPHAGE XR) 500 MG 24 hr tablet Take 1 tablet (500 mg total) by mouth daily with breakfast.  ? pantoprazole (PROTONIX) 40 MG tablet Take 40 mg by mouth daily.  ? spironolactone (ALDACTONE) 100 MG tablet Take 100 mg by mouth daily.  ? traZODone (DESYREL) 50 MG tablet Take 1 tablet (50 mg total) by mouth at bedtime as needed for sleep.  ? [DISCONTINUED] albuterol (VENTOLIN HFA) 108 (90 Base) MCG/ACT inhaler Inhale into the lungs.  ? [DISCONTINUED] ondansetron (ZOFRAN) 4 MG tablet Take 1 tablet by mouth every 8 (eight) hours as needed.  ? albuterol (VENTOLIN HFA) 108 (90 Base) MCG/ACT inhaler Inhale 2 puffs into the lungs every 6 (six) hours as needed for wheezing or shortness of breath.  ? folic acid (FOLVITE) 1 MG tablet Take by mouth. (Patient not taking: Reported on 04/14/2022)  ? Multiple Vitamin (MULTIVITAMIN WITH MINERALS) TABS tablet Take 1 tablet by mouth daily. (Patient not taking:  Reported on 04/14/2022)  ? naltrexone (DEPADE) 50 MG tablet Take 50 mg by mouth daily. (Patient not taking: Reported on 04/14/2022)  ? ondansetron (ZOFRAN) 4 MG tablet Take 1 tablet (4 mg total) by mouth every 8 (eight) hours as needed.  ? ?No facility-administered encounter medications on file as of 04/14/2022.  ? ? ?Past Medical History:  ?Diagnosis Date  ? Anemia   ? Cirrhosis ([place])   ? CKD (chronic kidney disease)   ? Depression   ? Diabetes mellitus, type II ([place])   ? Hyperlipidemia   ? Hypertension   ? Hypokalemia   ? Hyponatremia   ? ? ?Past Surgical History:  ?Procedure Laterality Date  ? CHOLECYSTECTOMY    ? ORTHOPEDIC SURGERY    ? ? ?Family  History  ?Problem Relation Age of Onset  ? Diabetes Mother   ? Hypertension Mother   ? Hypertension Father   ? ? ?Social History  ? ?Socioeconomic History  ? Marital status: Divorced  ?  Spouse name: Not on file  ? Number of children: 2  ? Years of education: Not on file  ? Highest education level: Not on file  ?Occupational History  ? Not on file  ?Tobacco Use  ? Smoking status: Every Day  ?  Packs/day: 1.00  ?  Types: Cigarettes  ? Smokeless tobacco: Never  ?Vaping Use  ? Vaping Use: Former  ?Substance and Sexual Activity  ? Alcohol use: Yes  ?  Comment: "2 quartz of beer a day"  ? Drug use: No  ? Sexual activity: Not on file  ?Other Topics Concern  ? Not on file  ?Social History Narrative  ? Lives with her room mate   ? ?Social Determinants of Health  ? ?Financial Resource Strain: Not on file  ?Food Insecurity: Not on file  ?Transportation Needs: Not on file  ?Physical Activity: Not on file  ?Stress: Not on file  ?Social Connections: Not on file  ?Intimate Partner Violence: Not on file  ? ? ?Review of Systems  ?Constitutional: Negative.   ?Respiratory: Negative.    ?Cardiovascular: Negative.   ?Gastrointestinal: Negative.   ?Neurological: Negative.   ?Psychiatric/Behavioral:  Negative for hallucinations, memory loss, substance abuse and suicidal ideas. The patient is not nervous/anxious and does not have insomnia.   ? ?  ? ? ?Objective   ? ?BP 125/79 (BP Location: Right Arm, Patient Position: Sitting, Cuff Size: Large)   Pulse (!) 110   Ht '5\' 5"'  (1.651 m)   Wt 188 lb (85.3 kg)   SpO2 97%   BMI 31.28 kg/m?  ? ?Physical Exam ?Constitutional:   ?   General: She is not in acute distress. ?   Appearance: She is not ill-appearing, toxic-appearing or diaphoretic.  ?Cardiovascular:  ?   Rate and Rhythm: Normal rate and regular rhythm.  ?   Pulses: Normal pulses.  ?   Heart sounds: Normal heart sounds.  ?Pulmonary:  ?   Effort: Pulmonary effort is normal. No respiratory distress.  ?   Breath sounds: Normal breath  sounds. No stridor. No wheezing, rhonchi or rales.  ?Chest:  ?   Chest wall: No tenderness.  ?Abdominal:  ?   Palpations: Abdomen is soft. There is no mass.  ?   Tenderness: There is no abdominal tenderness.  ?Skin: ?   Capillary Refill: Capillary refill takes less than 2 seconds.  ?Neurological:  ?   Mental Status: She is alert and oriented to person, place, and time.  ?Psychiatric:     ?  Mood and Affect: Mood normal.     ?   Behavior: Behavior normal.     ?   Thought Content: Thought content normal.     ?   Judgment: Judgment normal.  ? ? ? ?  ? ?Assessment & Plan:  ? ?Problem List Items Addressed This Visit   ? ?  ? Cardiovascular and Mediastinum  ? Hypertension  ?  BP Readings from Last 3 Encounters:  ?04/14/22 125/79  ?03/23/22 121/77  ?11/23/21 (!) 160/74  ?Chronic condition well-controlled ?On Lasix 40 mg daily, Aldactone 100 mg daily ?Has history of liver cirrhosis ?Check CMP at next visit ?DASH diet advised ?Needs to be on ACE or ARB due to diabetes ?  ?  ?  ? Digestive  ? Cirrhosis ([place])  ?  Followed by GI ?Has been having follow-up appointment every 4 months ?On Lasix and spironolactone ?She continues to drink alcohol ?Need to quit drinking discussed with patient she verbalized understanding ? ?  ?  ? Relevant Medications  ? ondansetron (ZOFRAN) 4 MG tablet  ?  ? Endocrine  ? Diabetes mellitus, type II ([place])  ?  Lab Results  ?Component Value Date  ? HGBA1C 5.6 03/23/2022  ?On Victoza 1.8 mg daily, ?70/30 insulin was discontinued at her last visit to Endo but she has resumed taking medication due to having high CBG readings at home ?Patient encouraged to maintain close follow-up with endocrinology ?Not on ACE, statin ?Due for urine micro albumin labs, diabetic eye exam, foot exam ?Avoid sugar sweets soda ? ?  ?  ?  ? Other  ? Hyperlipidemia  ?  Lab Results  ?Component Value Date  ? [address] [place] 135 05/21/2021  ? TRIG 123 05/21/2021  ?Currently not on a statin ?We will check lipid panel at next visit and start  on a statin ?  ?  ? GAD (generalized anxiety disorder) - Primary  ?  Currently on BuSpar 10 mg BID, Cymbalta 60 mg daily, trazodone 50 mg daily ?Followed by psych Verneda Skill ?Would like a referral to psych around

## 2022-04-14 NOTE — Assessment & Plan Note (Addendum)
BP Readings from Last 3 Encounters:  ?04/14/22 125/79  ?03/23/22 121/77  ?11/23/21 (!) 160/74  ?Chronic condition well-controlled ?On Lasix 40 mg daily, Aldactone 100 mg daily ?Has history of liver cirrhosis ?Check CMP at next visit ?DASH diet advised ?Needs to be on ACE or ARB due to diabetes ?

## 2022-04-14 NOTE — Patient Instructions (Signed)

## 2022-04-14 NOTE — Assessment & Plan Note (Addendum)
Lab Results  ?Component Value Date  ? [place] 135 05/21/2021  ? [place] 123 05/21/2021  ?Currently not on a statin ?We will check lipid panel at next visit and start on a statin ?

## 2022-04-14 NOTE — Assessment & Plan Note (Signed)
smokes half a pack of cigarettes daily,  was previously doing one pack a day. she has quit of and on, started smoking at age 61. Not ready to quit today.  ?Need to quit including risk of lung cancer COPD stroke discussed with patient she verbalized understanding ? ?

## 2022-04-14 NOTE — Assessment & Plan Note (Signed)
Currently on BuSpar 10 mg BID, Cymbalta 60 mg daily, trazodone 50 mg daily ?Followed by psych Verneda Skill ?Would like a referral to psych around here ?Referral placed to today. ?Denies SI, HI ?

## 2022-04-14 NOTE — Assessment & Plan Note (Signed)
Lab Results  ?Component Value Date  ? HGBA1C 5.6 03/23/2022  ?On Victoza 1.8 mg daily, ?70/30 insulin was discontinued at her last visit to Endo but she has resumed taking medication due to having high CBG readings at home ?Patient encouraged to maintain close follow-up with endocrinology ?Not on ACE, statin ?Due for urine micro albumin labs, diabetic eye exam, foot exam ?Avoid sugar sweets soda ? ?

## 2022-04-15 ENCOUNTER — Telehealth: Payer: Self-pay | Admitting: *Deleted

## 2022-04-15 NOTE — Chronic Care Management (AMB) (Signed)
?  Care Management  ? ?Note ? ?04/15/2022 ?Name: Michelle Michael MRN: [id number] DOB: Dec 22, 1961 ? ?[name] is a 61 y.o. year old female who is a primary care patient of Donell Beers, FNP. I reached out to Irving Shows by phone today offer care coordination services.  ? ?[name] was given information about care management services today including:  ?Care management services include personalized support from designated clinical staff supervised by her physician, including individualized plan of care and coordination with other care providers ?24/7 contact phone numbers for assistance for urgent and routine care needs. ?The patient may stop care management services at any time by phone call to the office staff. ? ?Patient agreed to services and verbal consent obtained.  ? ?Follow up plan: ?Telephone appointment with care management team member scheduled for:04/27/22 ? ?[name]  ?Care Guide, Embedded Care Coordination ?[place]  Care Management  ?Direct Dial: [phone] ? ? ?

## 2022-04-19 ENCOUNTER — Encounter: Payer: Self-pay | Admitting: Nurse Practitioner

## 2022-04-19 NOTE — Telephone Encounter (Signed)
Can you call this patient and get her to come in, bringing her CGM to discuss what to do next?  Thanks!

## 2022-04-20 ENCOUNTER — Ambulatory Visit: Payer: Medicaid Other | Admitting: Nurse Practitioner

## 2022-04-20 NOTE — Patient Instructions (Incomplete)
Diabetes Mellitus and Foot Care Foot care is an important part of your health, especially when you have diabetes. Diabetes may cause you to have problems because of poor blood flow (circulation) to your feet and legs, which can cause your skin to: Become thinner and drier. Break more easily. Heal more slowly. Peel and crack. You may also have nerve damage (neuropathy) in your legs and feet, causing decreased feeling in them. This means that you may not notice minor injuries to your feet that could lead to more serious problems. Noticing and addressing any potential problems early is the best way to prevent future foot problems. How to care for your feet Foot hygiene  Wash your feet daily with warm water and mild soap. Do not use hot water. Then, pat your feet and the areas between your toes until they are completely dry. Do not soak your feet as this can dry your skin. Trim your toenails straight across. Do not dig under them or around the cuticle. File the edges of your nails with an emery board or nail file. Apply a moisturizing lotion or petroleum jelly to the skin on your feet and to dry, brittle toenails. Use lotion that does not contain alcohol and is unscented. Do not apply lotion between your toes. Shoes and socks Wear clean socks or stockings every day. Make sure they are not too tight. Do not wear knee-high stockings since they may decrease blood flow to your legs. Wear shoes that fit properly and have enough cushioning. Always look in your shoes before you put them on to be sure there are no objects inside. To break in new shoes, wear them for just a few hours a day. This prevents injuries on your feet. Wounds, scrapes, corns, and calluses  Check your feet daily for blisters, cuts, bruises, sores, and redness. If you cannot see the bottom of your feet, use a mirror or ask someone for help. Do not cut corns or calluses or try to remove them with medicine. If you find a minor scrape,  cut, or break in the skin on your feet, keep it and the skin around it clean and dry. You may clean these areas with mild soap and water. Do not clean the area with peroxide, alcohol, or iodine. If you have a wound, scrape, corn, or callus on your foot, look at it several times a day to make sure it is healing and not infected. Check for: Redness, swelling, or pain. Fluid or blood. Warmth. Pus or a bad smell. General tips Do not cross your legs. This may decrease blood flow to your feet. Do not use heating pads or hot water bottles on your feet. They may burn your skin. If you have lost feeling in your feet or legs, you may not know this is happening until it is too late. Protect your feet from hot and cold by wearing shoes, such as at the beach or on hot pavement. Schedule a complete foot exam at least once a year (annually) or more often if you have foot problems. Report any cuts, sores, or bruises to your health care provider immediately. Where to find more information American Diabetes Association: www.diabetes.org Association of Diabetes Care & Education Specialists: www.diabeteseducator.org Contact a health care provider if: You have a medical condition that increases your risk of infection and you have any cuts, sores, or bruises on your feet. You have an injury that is not healing. You have redness on your legs or feet. You   feel burning or tingling in your legs or feet. You have pain or cramps in your legs and feet. Your legs or feet are numb. Your feet always feel cold. You have pain around any toenails. Get help right away if: You have a wound, scrape, corn, or callus on your foot and: You have pain, swelling, or redness that gets worse. You have fluid or blood coming from the wound, scrape, corn, or callus. Your wound, scrape, corn, or callus feels warm to the touch. You have pus or a bad smell coming from the wound, scrape, corn, or callus. You have a fever. You have a red  line going up your leg. Summary Check your feet every day for blisters, cuts, bruises, sores, and redness. Apply a moisturizing lotion or petroleum jelly to the skin on your feet and to dry, brittle toenails. Wear shoes that fit properly and have enough cushioning. If you have foot problems, report any cuts, sores, or bruises to your health care provider immediately. Schedule a complete foot exam at least once a year (annually) or more often if you have foot problems. This information is not intended to replace advice given to you by your health care provider. Make sure you discuss any questions you have with your health care provider. Document Revised: 07/02/2020 Document Reviewed: 07/02/2020 Elsevier Patient Education  2023 Elsevier Inc.  

## 2022-04-21 NOTE — Patient Instructions (Incomplete)
Diabetes Mellitus and Foot Care Foot care is an important part of your health, especially when you have diabetes. Diabetes may cause you to have problems because of poor blood flow (circulation) to your feet and legs, which can cause your skin to: Become thinner and drier. Break more easily. Heal more slowly. Peel and crack. You may also have nerve damage (neuropathy) in your legs and feet, causing decreased feeling in them. This means that you may not notice minor injuries to your feet that could lead to more serious problems. Noticing and addressing any potential problems early is the best way to prevent future foot problems. How to care for your feet Foot hygiene  Wash your feet daily with warm water and mild soap. Do not use hot water. Then, pat your feet and the areas between your toes until they are completely dry. Do not soak your feet as this can dry your skin. Trim your toenails straight across. Do not dig under them or around the cuticle. File the edges of your nails with an emery board or nail file. Apply a moisturizing lotion or petroleum jelly to the skin on your feet and to dry, brittle toenails. Use lotion that does not contain alcohol and is unscented. Do not apply lotion between your toes. Shoes and socks Wear clean socks or stockings every day. Make sure they are not too tight. Do not wear knee-high stockings since they may decrease blood flow to your legs. Wear shoes that fit properly and have enough cushioning. Always look in your shoes before you put them on to be sure there are no objects inside. To break in new shoes, wear them for just a few hours a day. This prevents injuries on your feet. Wounds, scrapes, corns, and calluses  Check your feet daily for blisters, cuts, bruises, sores, and redness. If you cannot see the bottom of your feet, use a mirror or ask someone for help. Do not cut corns or calluses or try to remove them with medicine. If you find a minor scrape,  cut, or break in the skin on your feet, keep it and the skin around it clean and dry. You may clean these areas with mild soap and water. Do not clean the area with peroxide, alcohol, or iodine. If you have a wound, scrape, corn, or callus on your foot, look at it several times a day to make sure it is healing and not infected. Check for: Redness, swelling, or pain. Fluid or blood. Warmth. Pus or a bad smell. General tips Do not cross your legs. This may decrease blood flow to your feet. Do not use heating pads or hot water bottles on your feet. They may burn your skin. If you have lost feeling in your feet or legs, you may not know this is happening until it is too late. Protect your feet from hot and cold by wearing shoes, such as at the beach or on hot pavement. Schedule a complete foot exam at least once a year (annually) or more often if you have foot problems. Report any cuts, sores, or bruises to your health care provider immediately. Where to find more information American Diabetes Association: www.diabetes.org Association of Diabetes Care & Education Specialists: www.diabeteseducator.org Contact a health care provider if: You have a medical condition that increases your risk of infection and you have any cuts, sores, or bruises on your feet. You have an injury that is not healing. You have redness on your legs or feet. You   feel burning or tingling in your legs or feet. You have pain or cramps in your legs and feet. Your legs or feet are numb. Your feet always feel cold. You have pain around any toenails. Get help right away if: You have a wound, scrape, corn, or callus on your foot and: You have pain, swelling, or redness that gets worse. You have fluid or blood coming from the wound, scrape, corn, or callus. Your wound, scrape, corn, or callus feels warm to the touch. You have pus or a bad smell coming from the wound, scrape, corn, or callus. You have a fever. You have a red  line going up your leg. Summary Check your feet every day for blisters, cuts, bruises, sores, and redness. Apply a moisturizing lotion or petroleum jelly to the skin on your feet and to dry, brittle toenails. Wear shoes that fit properly and have enough cushioning. If you have foot problems, report any cuts, sores, or bruises to your health care provider immediately. Schedule a complete foot exam at least once a year (annually) or more often if you have foot problems. This information is not intended to replace advice given to you by your health care provider. Make sure you discuss any questions you have with your health care provider. Document Revised: 07/02/2020 Document Reviewed: 07/02/2020 Elsevier Patient Education  2023 Elsevier Inc.  

## 2022-04-22 ENCOUNTER — Ambulatory Visit: Payer: Medicaid Other | Admitting: Nurse Practitioner

## 2022-04-22 DIAGNOSIS — I10 Essential (primary) hypertension: Secondary | ICD-10-CM

## 2022-04-22 DIAGNOSIS — E782 Mixed hyperlipidemia: Secondary | ICD-10-CM

## 2022-04-22 DIAGNOSIS — E119 Type 2 diabetes mellitus without complications: Secondary | ICD-10-CM

## 2022-04-25 ENCOUNTER — Encounter: Payer: Self-pay | Admitting: Nurse Practitioner

## 2022-04-25 ENCOUNTER — Ambulatory Visit: Payer: Medicare HMO | Admitting: Nurse Practitioner

## 2022-04-25 VITALS — BP 126/76 | HR 111 | Ht 65.0 in | Wt 187.0 lb

## 2022-04-25 DIAGNOSIS — I10 Essential (primary) hypertension: Secondary | ICD-10-CM

## 2022-04-25 DIAGNOSIS — E782 Mixed hyperlipidemia: Secondary | ICD-10-CM

## 2022-04-25 DIAGNOSIS — E119 Type 2 diabetes mellitus without complications: Secondary | ICD-10-CM

## 2022-04-25 DIAGNOSIS — Z794 Long term (current) use of insulin: Secondary | ICD-10-CM

## 2022-04-25 MED ORDER — GABAPENTIN 600 MG PO TABS: 600.0000 mg | ORAL_TABLET | Freq: Every day | ORAL | 0 refills | Status: DC

## 2022-04-25 MED ORDER — LIRAGLUTIDE 18 MG/3ML ~~LOC~~ SOPN: 1.8000 mg | PEN_INJECTOR | Freq: Every day | SUBCUTANEOUS | 3 refills | Status: DC

## 2022-04-25 MED ORDER — NOVOLIN 70/30 (70-30) 100 UNIT/ML ~~LOC~~ SUSP: 5.0000 [IU] | Freq: Two times a day (BID) | SUBCUTANEOUS | 3 refills | Status: DC

## 2022-04-25 NOTE — Patient Instructions (Signed)
Diabetes Mellitus and Foot Care Foot care is an important part of your health, especially when you have diabetes. Diabetes may cause you to have problems because of poor blood flow (circulation) to your feet and legs, which can cause your skin to: Become thinner and drier. Break more easily. Heal more slowly. Peel and crack. You may also have nerve damage (neuropathy) in your legs and feet, causing decreased feeling in them. This means that you may not notice minor injuries to your feet that could lead to more serious problems. Noticing and addressing any potential problems early is the best way to prevent future foot problems. How to care for your feet Foot hygiene  Wash your feet daily with warm water and mild soap. Do not use hot water. Then, pat your feet and the areas between your toes until they are completely dry. Do not soak your feet as this can dry your skin. Trim your toenails straight across. Do not dig under them or around the cuticle. File the edges of your nails with an emery board or nail file. Apply a moisturizing lotion or petroleum jelly to the skin on your feet and to dry, brittle toenails. Use lotion that does not contain alcohol and is unscented. Do not apply lotion between your toes. Shoes and socks Wear clean socks or stockings every day. Make sure they are not too tight. Do not wear knee-high stockings since they may decrease blood flow to your legs. Wear shoes that fit properly and have enough cushioning. Always look in your shoes before you put them on to be sure there are no objects inside. To break in new shoes, wear them for just a few hours a day. This prevents injuries on your feet. Wounds, scrapes, corns, and calluses  Check your feet daily for blisters, cuts, bruises, sores, and redness. If you cannot see the bottom of your feet, use a mirror or ask someone for help. Do not cut corns or calluses or try to remove them with medicine. If you find a minor scrape,  cut, or break in the skin on your feet, keep it and the skin around it clean and dry. You may clean these areas with mild soap and water. Do not clean the area with peroxide, alcohol, or iodine. If you have a wound, scrape, corn, or callus on your foot, look at it several times a day to make sure it is healing and not infected. Check for: Redness, swelling, or pain. Fluid or blood. Warmth. Pus or a bad smell. General tips Do not cross your legs. This may decrease blood flow to your feet. Do not use heating pads or hot water bottles on your feet. They may burn your skin. If you have lost feeling in your feet or legs, you may not know this is happening until it is too late. Protect your feet from hot and cold by wearing shoes, such as at the beach or on hot pavement. Schedule a complete foot exam at least once a year (annually) or more often if you have foot problems. Report any cuts, sores, or bruises to your health care provider immediately. Where to find more information American Diabetes Association: www.diabetes.org Association of Diabetes Care & Education Specialists: www.diabeteseducator.org Contact a health care provider if: You have a medical condition that increases your risk of infection and you have any cuts, sores, or bruises on your feet. You have an injury that is not healing. You have redness on your legs or feet. You   feel burning or tingling in your legs or feet. You have pain or cramps in your legs and feet. Your legs or feet are numb. Your feet always feel cold. You have pain around any toenails. Get help right away if: You have a wound, scrape, corn, or callus on your foot and: You have pain, swelling, or redness that gets worse. You have fluid or blood coming from the wound, scrape, corn, or callus. Your wound, scrape, corn, or callus feels warm to the touch. You have pus or a bad smell coming from the wound, scrape, corn, or callus. You have a fever. You have a red  line going up your leg. Summary Check your feet every day for blisters, cuts, bruises, sores, and redness. Apply a moisturizing lotion or petroleum jelly to the skin on your feet and to dry, brittle toenails. Wear shoes that fit properly and have enough cushioning. If you have foot problems, report any cuts, sores, or bruises to your health care provider immediately. Schedule a complete foot exam at least once a year (annually) or more often if you have foot problems. This information is not intended to replace advice given to you by your health care provider. Make sure you discuss any questions you have with your health care provider. Document Revised: 07/02/2020 Document Reviewed: 07/02/2020 Elsevier Patient Education  2023 Elsevier Inc.  

## 2022-04-25 NOTE — Progress Notes (Signed)
? ?                                                    Endocrinology Follow Up Note  ?     04/25/2022, 12:09 PM ? ? ?Subjective:  ? ? Patient ID: Michelle Michael, female    DOB: [birth year].  ?Michelle Michael is being seen in follow up after being in seen in consultation for management of currently uncontrolled symptomatic diabetes requested by  Renee Rival, FNP. ? ? ?Past Medical History:  ?Diagnosis Date  ? Anemia   ? Cirrhosis ([place])   ? CKD (chronic kidney disease)   ? Depression   ? Diabetes mellitus, type II ([place])   ? Hyperlipidemia   ? Hypertension   ? Hypokalemia   ? Hyponatremia   ? ? ?Past Surgical History:  ?Procedure Laterality Date  ? CHOLECYSTECTOMY    ? ORTHOPEDIC SURGERY    ? ? ?Social History  ? ?Socioeconomic History  ? Marital status: Divorced  ?  Spouse name: Not on file  ? Number of children: 2  ? Years of education: Not on file  ? Highest education level: Not on file  ?Occupational History  ? Not on file  ?Tobacco Use  ? Smoking status: Every Day  ?  Packs/day: 1.00  ?  Types: Cigarettes  ? Smokeless tobacco: Never  ?Vaping Use  ? Vaping Use: Former  ?Substance and Sexual Activity  ? Alcohol use: Yes  ?  Comment: "2 quartz of beer a day"  ? Drug use: No  ? Sexual activity: Not on file  ?Other Topics Concern  ? Not on file  ?Social History Narrative  ? Lives with her room mate   ? ?Social Determinants of Health  ? ?Financial Resource Strain: Not on file  ?Food Insecurity: Not on file  ?Transportation Needs: Not on file  ?Physical Activity: Not on file  ?Stress: Not on file  ?Social Connections: Not on file  ? ? ?Family History  ?Problem Relation Age of Onset  ? Diabetes Mother   ? Hypertension Mother   ? Hypertension Father   ? ? ?Outpatient Encounter Medications as of 04/25/2022  ?Medication Sig  ? gabapentin (NEURONTIN) 600 MG tablet Take 1 tablet (600 mg total) by mouth at bedtime.  ? insulin NPH-regular Human (NOVOLIN 70/30) (70-30) 100 UNIT/ML injection Inject 5-8 Units into  the skin 2 (two) times daily with a meal.  ? albuterol (VENTOLIN HFA) 108 (90 Base) MCG/ACT inhaler Inhale 2 puffs into the lungs every 6 (six) hours as needed for wheezing or shortness of breath.  ? BD PEN NEEDLE NANO 2ND GEN 32G X 4 MM MISC USE 1 PEN NEEDLE ONCE DAILY  ? busPIRone (BUSPAR) 10 MG tablet One tab at 8 Am - 1 PM - 8 PM  ? Continuous Blood Gluc Receiver ([place]) DEVI by Does not apply route.  ? Continuous Blood Gluc Sensor (DEXCOM G6 SENSOR) MISC Apply 1 sensor every 10 days.  ? Continuous Blood Gluc Transmit (DEXCOM G6 TRANSMITTER) MISC Use transmitter every 90 days.  ? DULoxetine (CYMBALTA) 60 MG capsule Take 60 mg by mouth every morning.  ? fluticasone (FLONASE) 50 MCG/ACT nasal spray Place 2 sprays into both nostrils daily.  ? folic acid (FOLVITE) 1 MG tablet Take by mouth. (Patient not taking: Reported on  04/14/2022)  ? furosemide (LASIX) 40 MG tablet Take 40 mg by mouth daily.  ? gabapentin (NEURONTIN) 300 MG capsule One cap at 8 AM  And 8 PM  ? Glucagon, rDNA, (GLUCAGON EMERGENCY) 1 MG KIT Inject 1 mg into the muscle as directed.  ? hydrOXYzine (ATARAX/VISTARIL) 10 MG tablet TAKE 1 Tablet BY MOUTH 3 TIMES DAILY AS NEEDED FOR ITCHING  ? INSULIN SYRINGE .5CC/29G 29G X 1/2" 0.5 ML MISC Use to inject insulin twice daily.  ? liraglutide (VICTOZA) 18 MG/3ML SOPN Inject 1.8 mg into the skin daily.  ? metFORMIN (GLUCOPHAGE XR) 500 MG 24 hr tablet Take 1 tablet (500 mg total) by mouth daily with breakfast.  ? Multiple Vitamin (MULTIVITAMIN WITH MINERALS) TABS tablet Take 1 tablet by mouth daily. (Patient not taking: Reported on 04/14/2022)  ? naltrexone (DEPADE) 50 MG tablet Take 50 mg by mouth daily. (Patient not taking: Reported on 04/14/2022)  ? ondansetron (ZOFRAN) 4 MG tablet Take 1 tablet (4 mg total) by mouth every 8 (eight) hours as needed.  ? pantoprazole (PROTONIX) 40 MG tablet Take 40 mg by mouth daily.  ? spironolactone (ALDACTONE) 100 MG tablet Take 100 mg by mouth daily.  ?  traZODone (DESYREL) 50 MG tablet Take 1 tablet (50 mg total) by mouth at bedtime as needed for sleep.  ? [DISCONTINUED] liraglutide (VICTOZA) 18 MG/3ML SOPN Inject 1.8 mg into the skin daily.  ? ?No facility-administered encounter medications on file as of 04/25/2022.  ? ? ?ALLERGIES: ?No Known Allergies ? ?VACCINATION STATUS: ? ?There is no immunization history on file for this patient. ? ?Diabetes ?She presents for her follow-up diabetic visit. She has type 2 diabetes mellitus. Onset time: Diagnosed at approx age of 62. Her disease course has been fluctuating. There are no hypoglycemic associated symptoms. Associated symptoms include fatigue and foot paresthesias. There are no hypoglycemic complications. Symptoms are stable. Diabetic complications include nephropathy and peripheral neuropathy. Risk factors for coronary artery disease include diabetes mellitus, dyslipidemia, family history, hypertension, tobacco exposure and sedentary lifestyle. Current diabetic treatment includes oral agent (monotherapy) and insulin injections (and Victoza). She is compliant with treatment most of the time. Her weight is fluctuating minimally. She is following a generally healthy diet. When asked about meal planning, she reported none. She has not had a previous visit with a dietitian. She participates in exercise intermittently. Her home blood glucose trend is increasing steadily. Her overall blood glucose range is 140-180 mg/dl. (She presents today with her CGM showing rising glycemic profile overall.  She called between visits for higher readings once stopping the premixed insulin and we restarted it at 5 units BID.  Analysis of her CGM shows TIR 69%, TAR 29%, TBR 2% with a GMI of 7.1%.  Since he has had higher readings, her neuropathy has been way worse, has not been able to get any rest.) An ACE inhibitor/angiotensin II receptor blocker is not being taken. She does not see a podiatrist.Eye exam is current.  ?Hypertension ?This  is a chronic problem. The current episode started more than 1 year ago. The problem has been resolved since onset. The problem is controlled. There are no associated agents to hypertension. Risk factors for coronary artery disease include diabetes mellitus, dyslipidemia, smoking/tobacco exposure, family history and sedentary lifestyle. Past treatments include diuretics. The current treatment provides mild improvement. Compliance problems include diet, exercise and psychosocial issues.   ?Hyperlipidemia ?This is a chronic problem. The current episode started more than 1 year ago. The problem is  uncontrolled. Recent lipid tests were reviewed and are high. Exacerbating diseases include diabetes. Factors aggravating her hyperlipidemia include smoking and fatty foods. She is currently on no antihyperlipidemic treatment. Compliance problems include adherence to diet, adherence to exercise and psychosocial issues.  Risk factors for coronary artery disease include diabetes mellitus, dyslipidemia, family history, hypertension and a sedentary lifestyle.  ? ? ?Review of systems ? ?Constitutional: + Minimally fluctuating body weight, current Body mass index is 31.12 kg/m?., + fatigue, no subjective hyperthermia, no subjective hypothermia ?Eyes: no blurry vision, no xerophthalmia ?ENT: no sore throat, no nodules palpated in throat, no dysphagia/odynophagia, no hoarseness ?Cardiovascular: no chest pain, no shortness of breath, no palpitations, no leg swelling ?Respiratory: chronic cough-smoker, no shortness of breath ?Gastrointestinal: no vomiting/diarrhea ?Musculoskeletal: no muscle/joint aches ?Skin: no rashes, no hyperemia ?Neurological: no tremors, + numbness/tingling to BLE- worsening, no dizziness ?Psychiatric: hx depression- controlled on meds, no anxiety, reports increased stress levels ? ?Objective:  ?  ? ?BP 126/76   Pulse (!) 111   Ht $R'5\' 5"'WX$  (1.651 m)   Wt 187 lb (84.8 kg)   BMI 31.12 kg/m?   ?Wt Readings from Last  3 Encounters:  ?04/25/22 187 lb (84.8 kg)  ?04/14/22 188 lb (85.3 kg)  ?03/23/22 188 lb (85.3 kg)  ?  ? ?BP Readings from Last 3 Encounters:  ?04/25/22 126/76  ?04/14/22 125/79  ?03/23/22 121/77  ?  ? ?Physical

## 2022-04-27 ENCOUNTER — Ambulatory Visit: Payer: Medicare HMO | Admitting: Licensed Clinical Social Worker

## 2022-04-27 ENCOUNTER — Telehealth: Payer: Self-pay

## 2022-04-27 ENCOUNTER — Encounter: Payer: Self-pay | Admitting: Internal Medicine

## 2022-04-27 DIAGNOSIS — F332 Major depressive disorder, recurrent severe without psychotic features: Secondary | ICD-10-CM

## 2022-04-27 DIAGNOSIS — F411 Generalized anxiety disorder: Secondary | ICD-10-CM

## 2022-04-27 NOTE — Patient Instructions (Signed)
Visit Information ? ?Thank you for taking time to visit with me today. Please don't hesitate to contact me if I can be of assistance to you before our next scheduled telephone appointment. ? ?Following are the goals we discussed today: medication evaluation with psychiatry ?Task & Summary: ?Continue with compliance of taking medication prescribed by Doctor ?Call the agency below to schedule your appointment. You will need register on line as a new patient ?[facility] ?[facility]  ?[email or web address]   ? [address], [address] [place], [place] [address]  Tel: [phone] ? ?Our next appointment is by telephone on May 17th at 11:45 ? ?Please call the care guide team at [phone] if you need to cancel or reschedule your appointment.  ? ?If you are experiencing a Mental Health or Behavioral Health Crisis or need someone to talk to, please call 1-800-273-TALK (toll free, 24 hour hotline) ?call the [facility]: [phone]  ? ? ?Michelle Michael was given information about Care Management services by the embedded care coordination team including:  ?Care Management services include personalized support from designated clinical staff supervised by her physician, including individualized plan of care and coordination with other care providers ?24/7 contact phone numbers for assistance for urgent and routine care needs. ?The patient may stop CCM services at any time (effective at the end of the month) by phone call to the office staff. ? ?Patient agreed to services and verbal consent obtained.  ? ?Patient verbalizes understanding of instructions and care plan provided today and agrees to view in MyChart. Active MyChart status confirmed with patient.   ? ?Sammuel Hines, LCSW ?Licensed Clinical Social Worker [facility]  ?[facility] ?[phone]  ? ? ?  ?

## 2022-04-27 NOTE — Chronic Care Management (AMB) (Signed)
? ?  Social Work  ?Care Coordination Note ? ?04/27/2022 ?Name: Michelle Michael MRN: [id number] DOB: May 30, 1961 ? ?Subjective: ?Michelle Michael is a 61 y.o. year old female who is a primary care patient of Donell Beers, FNP. The Care Management team was consulted to assist the patient with:  connect with psychiatry for medication evaluation   .   ? ?Engaged with Patient  by telephone for initial visit in response to referral for social work case management and/or care coordination services.  ? ?Summary: Assessed patient's previous and current treatment, coping skills, support system and barriers to care. ?She is current followed by psychiatry at [facility] for the past 3 years, reports being stable on medication and would like to see if she is able to transition off of some of her mental health medication.  She is not interested in counseling at this time .  ? ?Follow up Plan: Patient would like continued follow-up from CCM LCSW.  per patient's request will follow up in 2 weeks.  Will call office if needed prior to next encounter. ?  ?SDOH (Social Determinants of Health) screening performed :  ?SDOH Interventions   ? ?Flowsheet Row Most Recent Value  ?SDOH Interventions   ?Stress Interventions Provide Counseling  ? ?  ?   ?LCSW Plan of care for Conditions / Unmet needs:  ? ?Mental Health:  (Status: New goal.) ?Evaluation of current treatment plan related to Depression: anxiety ?Solution-Focused Strategies employed:  ?Active listening / Reflection utilized  ?Behavioral Activation reviewed ?Provided psychoeducation for mental health needs  ?Discussed referral for psychiatry: reviewed options base on insurance and location. ? ?Task & Summary for AVS: ?Continue with compliance of taking medication prescribed by Doctor ?Call the agency below to schedule your appointment. You will need register on line as a new patient ?[facility] ?[facility]  ?[email or web address]   ? [address] [place], [place] [address]  Tel: [phone] ? ?Sammuel Hines, LCSW ?Licensed Clinical Social Worker [facility]  ?[facility] ?[phone]   ?

## 2022-04-27 NOTE — Telephone Encounter (Signed)
Called pt in regards to referral to local gi per [place] states that gi here will see her advised pt to be looking for call pt verbalized understanding ?

## 2022-05-09 ENCOUNTER — Inpatient Hospital Stay: Admission: RE | Admit: 2022-05-09 | Payer: Medicare HMO | Source: Ambulatory Visit

## 2022-05-11 ENCOUNTER — Ambulatory Visit: Payer: Medicare HMO | Admitting: Licensed Clinical Social Worker

## 2022-05-11 DIAGNOSIS — F332 Major depressive disorder, recurrent severe without psychotic features: Secondary | ICD-10-CM

## 2022-05-11 DIAGNOSIS — F411 Generalized anxiety disorder: Secondary | ICD-10-CM

## 2022-05-11 NOTE — Patient Instructions (Signed)
Visit Information ? ?Thank you for taking time to visit with me today. Please don't hesitate to contact me if I can be of assistance to you before our next scheduled telephone appointment. ? ?Following are the goals we discussed today:  ?Task & activities to accomplish goals: ?Continue with compliance of taking medication prescribed by Doctor ?Look for e-mail from [facility] : Breathing to Relax and Insomnia & Getting a good night's sleep  ?Keep appointment with [facility] on July 17th at 8:30 ?[facility] ?[facility]  [email or web address]   ? [address] [place], [place] [address]  Tel: [phone] ? ?Our next appointment is by telephone on July 19th at 9:00 ? ?Please call the care guide team at [phone] if you need to cancel or reschedule your appointment.  ? ?If you are experiencing a Mental Health or Behavioral Health Crisis or need someone to talk to, please call 1-800-273-TALK (toll free, 24 hour hotline)  ? ?Patient verbalizes understanding of instructions and care plan provided today and agrees to view in MyChart. Active MyChart status and patient understanding of how to access instructions and care plan via MyChart confirmed with patient.    ? ?Sammuel Hines, LCSW ?Licensed Clinical Social Worker [facility]  ?[facility] ?[phone]  ?

## 2022-05-11 NOTE — Chronic Care Management (AMB) (Signed)
Care Management ?Clinical Social Work Note ? ?05/11/2022 ?Name: Michelle Michael MRN: [id number] DOB: 04/07/1961 ? ?Michelle Michael is a 61 y.o. year old female who is a primary care patient of Donell Beers, FNP.  The Care Management team was consulted for assistance with chronic disease management and coordination needs. ? ?Engaged with patient by telephone for follow up visit in response to provider referral for social work chronic care management and care coordination services ? ?Consent to Services:  ?Ms. Greer was given information about Care Management services today including:  ?Care Management services includes personalized support from designated clinical staff supervised by her physician, including individualized plan of care and coordination with other care providers ?24/7 contact phone numbers for assistance for urgent and routine care needs. ?The patient may stop case management services at any time by phone call to the office staff. ? ?Patient agreed to services and consent obtained.  ? ?Summary: Assessed patient's current treatment, progress, coping skills, support system and barriers to care.  ?She is making progress with goals, has contacted mental health provider and has appointment July 17th with [facility] for psychotherapy and medication evaluation.  Report some difficulty with sleep but takes medication when needed..  See Care Plan below for interventions and patient self-care actives. ? ?Recommendation: Patient may benefit from, and is in agreement to explore information on sleep hygiene.  Will review EMMI video sent via e-mail.  ? ?Follow up Plan: Patient would like continued follow-up from CCM LCSW.  per patient's request will follow up in 60 days.  Will call office if needed prior to next encounter. ?  ?Assessment: Review of patient past medical history, allergies, medications, and health status, including review of relevant consultants reports was performed today as part of a  comprehensive evaluation and provision of chronic care management and care coordination services. ? ?SDOH (Social Determinants of Health) assessments and interventions performed:   ? ?Advanced Directives Status: Not addressed in this encounter. ? ?Care Plan ?Conditions to be addressed/monitored: Anxiety and Depression;  ? ?Care Plan : LCSW Plan of Care  ?Updates made by Soundra Pilon, LCSW since 05/11/2022 12:00 AM  ?  ? ?Problem: Needs local mental health provider   ?  ? ?Goal: Quality of Life Maintained with local provider   ?Start Date: 04/27/2022  ?This Visit's Progress: On track  ?Priority: High  ?Note:   ?Current Barriers:  ?Disease Management support and education needs related to Depression: anxiety ? ?CSW Clinical Goal(s):  ? will patient will work with [facility] to address needs related to managing symptoms of anxiety and depression  through collaboration with Clinical Social Worker, provider, and care team.  ? ?Interventions: ?Inter-disciplinary care team collaboration (see longitudinal plan of care) ?Evaluation of current treatment plan related to  self management and patient's adherence to plan as established by provider ? ?Mental Health:  (Status: Goal on Track (progressing): YES.) ?Evaluation of current treatment plan related to  managing depression and anxiety ?Solution-Focused Strategies employed:  ?Active listening / Reflection utilized  ?Problem Solving /Task Center strategies reviewed ?Provided psychoeducation for mental health needs  ?Provided EMMI education information on Breathing to Relax and Insomnia & getting a good night's sleep ?Discussed referral for psychiatry: reviewed options base on insurance and location. ?Task & activities to accomplish goals: ?Continue with compliance of taking medication prescribed by Doctor ?Look for e-mail from [facility] : Breathing to Relax and Insomnia & Getting a good night's sleep  ?Keep appointment with [facility] on  July 17th at 8:30 ?[facility] ?[facility]  ?[email or web address]   ? [address] [place], [place] [address]  Tel: [phone] ? ?  ?  ?Sammuel Hines, LCSW ?Licensed Clinical Social Worker [facility]  ?[facility] ?[phone]  ?

## 2022-05-13 ENCOUNTER — Other Ambulatory Visit: Payer: Self-pay | Admitting: Nurse Practitioner

## 2022-05-13 DIAGNOSIS — K7030 Alcoholic cirrhosis of liver without ascites: Secondary | ICD-10-CM

## 2022-05-17 NOTE — Progress Notes (Addendum)
GI Office Note    Referring Provider: Renee Rival, FNP Primary Care Physician:  Renee Rival, FNP  Primary GI: Dr. Abbey Chatters  Chief Complaint   Chief Complaint  Patient presents with   Cirrhosis     History of Present Illness   Michelle Michael is a 61 y.o. female presenting today at the request of Paseda, Folashade R, FNP to establish GI care for alcoholic cirrhosis and chronic GERD.  Previously followed by Dr. Janus Molder with [facility].  She was last seen by him November 2022.  At this time she had no evidence of volume overload bleeding or HE.  She reportedly felt like she was doing well, she has been maintained on Lasix 40 mg daily and spironolactone 100 mg daily.  She also takes Protonix 40 mg daily.  At this time apparently her last ultrasound was in December 2021 without evidence of masses, last EGD to screen for esophageal varices was February 2021 which showed very small varices that disappear with insufflation and mild portal hypertensive gastropathy.  Follow-up  RUQ ultrasound ordered as well as MELD labs.  Most recent lab work from November 2022 - CBC with hemoglobin 16.2, MCV 100, platelets 254, creatinine 0.72, sodium 137, alk phos elevated at 331, AST elevated 70, ALT 28, T. bili 0.9, albumin 4.1, PT 11.6, INR 1.1  Most recent RUQ [place] Nov 2022: Liver measuring 16.4 cm(normal in size).  Coarse echotexture and margin microlobulated.  No focal liver lesion seen no ascites seen.  No biliary ductal dilation.  Of note there was very little flow demonstrated in the portal vein suggesting at least partial thrombosis, flow was seen by directional.  Last AFP 4.6 in November 2021.  Prior work-up for cirrhosis revealed negative AMA, AMA, viral hepatitis.  ASMA slightly elevated to 22.  Last EGD and colonoscopy February 2021, no report available   Today:  Cirrhosis: Alcohol intake - did nothing for a year, has a bottle of wine twice  a week, but not weekly. Will have it all at once. Feels like it is related to emotions and stress or boredom. Prior to that would have vodka everyday - then presented to the hospital with ascites etc. Has been having some increased lower extremity edema. Will put her feet up for a day then it improves. Belly has been soft, states says her belly is bigger due to that. Does report that she has gained weight since she was hospitalized a couple years ago - was 130 and now 190. Was started on Victoza to try to reduce the amount of insulin she is taking. Has been using zofran due to nausea from her meds, some intermittent vomiting. Does not happen everyday. May take 1 a day but not everyday. Is still able to eat after. Denies any bleeding episodes. Denies abdominal pain. Bowel movements are usually once a day, sometimes 3-4 times a day, usually a mushy/watery consistency. Feels like sometimes it is related to foods such as spicy things.   GERD: On pantoprazole 40 mg daily. Bothered by spicy foods. She reports in the course of her moving she lost her meds and she felt like she suffered for 8-9 days. No often having breakthrough symptoms.  Denies early satiety or lack appetite.   Past Medical History:  Diagnosis Date   Anemia    Cirrhosis ([place])    CKD (chronic kidney disease)    Depression    Diabetes mellitus, type II ([place])  Hyperlipidemia    Hypertension    Hypokalemia    Hyponatremia     Past Surgical History:  Procedure Laterality Date   CHOLECYSTECTOMY     ORTHOPEDIC SURGERY      Current Outpatient Medications  Medication Sig Dispense Refill   albuterol (VENTOLIN HFA) 108 (90 Base) MCG/ACT inhaler Inhale 2 puffs into the lungs every 6 (six) hours as needed for wheezing or shortness of breath. 1 each 3   BD PEN NEEDLE NANO 2ND GEN 32G X 4 MM MISC USE 1 PEN NEEDLE ONCE DAILY     busPIRone (BUSPAR) 10 MG tablet One tab at 8 Am - 1 PM - 8 PM     Continuous Blood Gluc Sensor (DEXCOM G6 SENSOR)  MISC Apply 1 sensor every 10 days. 9 each 1   Continuous Blood Gluc Transmit (DEXCOM G6 TRANSMITTER) MISC Use transmitter every 90 days. 1 each 1   DULoxetine (CYMBALTA) 60 MG capsule Take 60 mg by mouth every morning.     fluticasone (FLONASE) 50 MCG/ACT nasal spray Place 2 sprays into both nostrils daily.     furosemide (LASIX) 40 MG tablet Take 40 mg by mouth daily.     gabapentin (NEURONTIN) 300 MG capsule One cap at 8 AM  And 8 PM     gabapentin (NEURONTIN) 600 MG tablet Take 1 tablet (600 mg total) by mouth at bedtime. 90 tablet 0   Glucagon, rDNA, (GLUCAGON EMERGENCY) 1 MG KIT Inject 1 mg into the muscle as directed.     hydrOXYzine (ATARAX/VISTARIL) 10 MG tablet TAKE 1 Tablet BY MOUTH 3 TIMES DAILY AS NEEDED FOR ITCHING     insulin NPH-regular Human (NOVOLIN 70/30) (70-30) 100 UNIT/ML injection Inject 5-8 Units into the skin 2 (two) times daily with a meal. 30 mL 3   INSULIN SYRINGE .5CC/29G 29G X 1/2" 0.5 ML MISC Use to inject insulin twice daily. 100 each 3   liraglutide (VICTOZA) 18 MG/3ML SOPN Inject 1.8 mg into the skin daily. 30 mL 3   metFORMIN (GLUCOPHAGE XR) 500 MG 24 hr tablet Take 1 tablet (500 mg total) by mouth daily with breakfast. 90 tablet 0   Multiple Vitamin (MULTIVITAMIN WITH MINERALS) TABS tablet Take 1 tablet by mouth daily.     ondansetron (ZOFRAN) 4 MG tablet TAKE 1 TABLET BY MOUTH EVERY 8 HOURS AS NEEDED 20 tablet 0   pantoprazole (PROTONIX) 40 MG tablet Take 40 mg by mouth daily.     spironolactone (ALDACTONE) 100 MG tablet Take 100 mg by mouth daily.     traZODone (DESYREL) 50 MG tablet Take 1 tablet (50 mg total) by mouth at bedtime as needed for sleep. 30 tablet 0   Continuous Blood Gluc Receiver ([place]) DEVI by Does not apply route.     No current facility-administered medications for this visit.    Allergies as of 05/18/2022   (No Active Allergies)    Family History  Problem Relation Age of Onset   Diabetes Mother    Hypertension Mother     Hypertension Father     Social History   Socioeconomic History   Marital status: Divorced    Spouse name: Not on file   Number of children: 2   Years of education: Not on file   Highest education level: Not on file  Occupational History   Not on file  Tobacco Use   Smoking status: Every Day    Packs/day: 1.00    Types: Cigarettes   Smokeless  tobacco: Never  Vaping Use   Vaping Use: Former  Substance and Sexual Activity   Alcohol use: Yes    Comment: "2 quartz of beer a day"   Drug use: No   Sexual activity: Not on file  Other Topics Concern   Not on file  Social History Narrative   Lives with her room mate    Social Determinants of Health   Financial Resource Strain: Low Risk    Difficulty of Paying Living Expenses: Not hard at all  Food Insecurity: No Food Insecurity   Worried About Charity fundraiser in the Last Year: Never true   [place] in the Last Year: Never true  Transportation Needs: No Transportation Needs   Lack of Transportation (Medical): No   Lack of Transportation (Non-Medical): No  Physical Activity: Insufficiently Active   Days of Exercise per Week: 2 days   Minutes of Exercise per Session: 30 min  Stress: Stress Concern Present   Feeling of Stress : To some extent  Social Connections: Not on file  Intimate Partner Violence: Not on file     Review of Systems   Gen: Denies any fever, chills, fatigue, weight loss, lack of appetite.  CV: Denies chest pain, heart palpitations, peripheral edema, syncope.  Resp: Denies shortness of breath at rest or with exertion. Denies wheezing or cough.  GI: See HPI. GU : Denies urinary burning, urinary frequency, urinary hesitancy MS: Denies joint pain, muscle weakness, cramps, or limitation of movement.  Derm: Denies rash, itching, dry skin Psych: Denies depression, anxiety, memory loss, and confusion Heme: Denies bruising, bleeding, and enlarged lymph nodes.   Physical Exam   BP 120/68    Pulse 100   Temp (!) 97.5 F (36.4 C) (Temporal)   Ht _0  (1.651 m)   Wt 190 lb 3.2 oz (86.3 kg)   BMI 31.65 kg/m   General:   Alert and oriented. Pleasant and cooperative. Well-nourished and well-developed.  Head:  Normocephalic and atraumatic. Eyes:  Without icterus, sclera clear and conjunctiva pink.  Ears:  Normal auditory acuity. Mouth:  No deformity or lesions, oral mucosa pink.  Lungs:  Clear to auscultation bilaterally. No wheezes, rales, or rhonchi. No distress.  Heart:  S1, S2 present without murmurs appreciated.  Abdomen:  +BS, soft, non-tender and non-distended.  Mild hepatomegaly noted. No guarding or rebound. No masses appreciated.  Rectal:  Deferred  Msk:  Symmetrical without gross deformities. Normal posture. Extremities: Nonpitting edema to bilateral lower extremities Neurologic:  Alert and  oriented x4;  grossly normal neurologically. Skin:  Intact without significant lesions or rashes. Psych:  Alert and cooperative. Normal mood and affect.   Assessment   Michelle Michael is a 61 y.o. female with a history of anemia, CKD, depression, type 2 diabetes, HLD, HTN, and alcoholic cirrhosis presenting today to establish with new GI and follow-up on alcoholic cirrhosis and GERD.  Alcoholic cirrhosis: Doing well overall.  Denies any increased abdominal swelling.  She does have some increased peripheral edema from time to time that resolves after elevating her extremities.  She is currently on torsemide 40 mg daily and spironolactone 100 mg daily.  Denies any overt GI bleeding or confusion, does not exhibit any signs of hepatic encephalopathy.  Mild hepatomegaly noted on exam, otherwise abdomen soft nontender. She does continue to drink despite knowing she should not.  She has about 1-2 bottles of wine in a week but not every week.  Last ultrasound performed November  2022 noting cirrhosis without hepatoma, partial thrombosis suspected.  We will continue current diuretic management,  will update MELD labs today including AFP, and update right upper quadrant ultrasound with liver Doppler.  She is not due for surveillance EGD until November 2024.  Advised her to follow a 2 g sodium diet, separate handout provided.  Reinforced alcohol cessation today.  May need to consider increasing Lasix to 40 mg twice daily and next appointment if swelling continues or worsens.  GERD: Well controlled on pantoprazole 40 mg daily.  We discussed potential long-term effects of PPI including osteoporosis.  Reported that during her recent move she went without her medication for about 8 to 9 days and had extreme return of symptoms.  Discussed risk versus benefit of medication.  Advised that pending her labs she could begin taking calcium and vitamin D supplementation to offset risk of osteoporosis.  She does have some occasional nausea, continued on Zofran per PCP.  Does not have to use it daily.  Likely related to polypharmacy and diabetes.   PLAN   Update MELD labs: CBC, CMP, PT/INR Obtain updated AFP Update right upper quadrant ultrasound with Doppler Records request to obtain EGD and colonoscopy report from [facility]) Reinforced alcohol cessation Reinforced GERD lifestyle.  Follow 2g Sodium diet May need to consider lasix 40 mg BID at next appointment if swelling continues.  Follow-up in 6 months   Addendum: Received prior colonoscopy report from February 2021.  Single 4 mm polyp in 10 mm polyp found in the sigmoid colon. (Path report showing tubular adenomas).  EGD with 1 nonbleeding cratered duodenal ulcer with no stigmata of bleeding found in second portion of duodenum, localized mildly erythematous mucosa without bleeding in the gastric antrum s/p biopsy (atrophic antral type mucosa with moderate chronic inactive gastritis and intestinal metaplasia, negative H. Pylori), mild portal hypertensive gastropathy, grade 1 varices, lower third of esophagus.   Venetia Night, MSN, FNP-BC,  AGACNP-BC [facility]

## 2022-05-18 ENCOUNTER — Telehealth: Payer: Self-pay | Admitting: *Deleted

## 2022-05-18 ENCOUNTER — Ambulatory Visit (INDEPENDENT_AMBULATORY_CARE_PROVIDER_SITE_OTHER): Payer: Medicare HMO | Admitting: Gastroenterology

## 2022-05-18 ENCOUNTER — Encounter: Payer: Self-pay | Admitting: Gastroenterology

## 2022-05-18 VITALS — BP 120/68 | HR 100 | Temp 97.5°F | Ht 65.0 in | Wt 190.2 lb

## 2022-05-18 DIAGNOSIS — K7030 Alcoholic cirrhosis of liver without ascites: Secondary | ICD-10-CM

## 2022-05-18 DIAGNOSIS — K219 Gastro-esophageal reflux disease without esophagitis: Secondary | ICD-10-CM

## 2022-05-18 NOTE — Patient Instructions (Addendum)
It was a pleasure to meet you today!  I am ordering labs for you to have completed at [facility] at your earliest convenience.  It is located in the two-story building just across the street from [facility].  He will be provided with lab slips, usually if you take these with you to the hospital to have your labs completed you should be able to have them done at your ultrasound appointment.  Our scheduling team will contact you with an appointment for your right upper quadrant ultrasound.   In order to improve your lower extremity swelling, you should follow a 2 g sodium diet (heart healthy diet).  I have attached some education with foods to avoid.  We can discuss increasing your furosemide to twice daily at your next visit if swelling continues.  As you know it is recommended that you have complete alcohol cessation to prevent the progression of your cirrhosis.  For your reflux you may continue to take pantoprazole 40 mg daily.  Continue to avoid any food triggers and avoid eating within 2 to 3 hours of going to bed.  I will contact you with results once I have had time to review them.  Please fill out a records request form at the front to obtain your prior EGD and colonoscopy report from your prior GI physician.  We will see you again in 6 months, please feel free to contact the office if any other further issues arise prior to then.  It was a pleasure to see you today. I want to create trusting relationships with patients. If you receive a survey regarding your visit,  I greatly appreciate you taking time to fill this out on paper or through your MyChart. I value your feedback.  Brooke Bonito, MSN, FNP-BC, AGACNP-BC [facility]

## 2022-05-18 NOTE — Telephone Encounter (Signed)
Called pt. Made aware of appt details for [place]. She voiced understanding

## 2022-05-24 NOTE — Progress Notes (Signed)
LMOM for pt to call office back 

## 2022-05-25 NOTE — Progress Notes (Signed)
Spoke to pt, she informed me that she will have labs done next week when she has the ultrasound done. She has problems with transportation.

## 2022-05-31 ENCOUNTER — Ambulatory Visit (HOSPITAL_COMMUNITY)
Admission: RE | Admit: 2022-05-31 | Discharge: 2022-05-31 | Disposition: A | Discharge status: Home / Self Care | Payer: Medicare HMO | Source: Ambulatory Visit | Attending: Gastroenterology | Admitting: Gastroenterology

## 2022-05-31 ENCOUNTER — Other Ambulatory Visit (HOSPITAL_COMMUNITY)
Admission: RE | Admit: 2022-05-31 | Discharge: 2022-05-31 | Disposition: A | Discharge status: Home / Self Care | Payer: Medicare HMO | Source: Ambulatory Visit | Attending: Gastroenterology | Admitting: Gastroenterology

## 2022-05-31 DIAGNOSIS — K7689 Other specified diseases of liver: Secondary | ICD-10-CM | POA: Diagnosis not present

## 2022-05-31 DIAGNOSIS — K7030 Alcoholic cirrhosis of liver without ascites: Secondary | ICD-10-CM | POA: Diagnosis not present

## 2022-05-31 DIAGNOSIS — K7460 Unspecified cirrhosis of liver: Secondary | ICD-10-CM | POA: Diagnosis not present

## 2022-05-31 LAB — COMPREHENSIVE METABOLIC PANEL
ALT: 30 U/L (ref 0–44)
AST: 91 U/L — ABNORMAL HIGH (ref 15–41)
Albumin: 3.3 g/dL — ABNORMAL LOW (ref 3.5–5.0)
Alkaline Phosphatase: 272 U/L — ABNORMAL HIGH (ref 38–126)
Anion gap: 8 (ref 5–15)
BUN: 6 mg/dL (ref 6–20)
CO2: 30 mmol/L (ref 22–32)
Calcium: 8.9 mg/dL (ref 8.9–10.3)
Chloride: 99 mmol/L (ref 98–111)
Creatinine, Ser: 0.64 mg/dL (ref 0.44–1.00)
GFR, Estimated: >60 mL/min (ref >60)
Glucose, Bld: 130 mg/dL — ABNORMAL HIGH (ref 70–99)
Potassium: 3.8 mmol/L (ref 3.5–5.1)
Sodium: 137 mmol/L (ref 135–145)
Total Bilirubin: 0.8 mg/dL (ref 0.3–1.2)
Total Protein: 6.8 g/dL (ref 6.5–8.1)

## 2022-05-31 LAB — PROTIME-INR
INR: 1.1 (ref 0.8–1.2)
Prothrombin Time: 14.3 seconds (ref 11.4–15.2)

## 2022-05-31 LAB — CBC
HCT: 45.1 % (ref 36.0–46.0)
Hemoglobin: 15.1 g/dL — ABNORMAL HIGH (ref 12.0–15.0)
MCH: 37 pg — ABNORMAL HIGH (ref 26.0–34.0)
MCHC: 33.5 g/dL (ref 30.0–36.0)
MCV: 110.5 fL — ABNORMAL HIGH (ref 80.0–100.0)
Platelets: 175 10*3/uL (ref 150–400)
RBC: 4.08 MIL/uL (ref 3.87–5.11)
RDW: 13.5 % (ref 11.5–15.5)
WBC: 6.3 10*3/uL (ref 4.0–10.5)
nRBC: 0 % (ref 0.0–0.2)

## 2022-06-01 LAB — AFP TUMOR MARKER: AFP, Serum, Tumor Marker: 5.1 ng/mL (ref 0.0–9.2)

## 2022-06-03 ENCOUNTER — Telehealth: Payer: Self-pay | Admitting: "Endocrinology

## 2022-06-03 DIAGNOSIS — E119 Type 2 diabetes mellitus without complications: Secondary | ICD-10-CM

## 2022-06-03 MED ORDER — NOVOLIN 70/30 FLEXPEN (70-30) 100 UNIT/ML ~~LOC~~ SUPN: 5.0000 [IU] | PEN_INJECTOR | Freq: Two times a day (BID) | SUBCUTANEOUS | 0 refills | Status: DC

## 2022-06-03 NOTE — Telephone Encounter (Signed)
Patient said she got a text from [facility] saying she was due for a refill on 70/30. She said [facility] mailed a letter stating they will not pay for the [employer]. She said please send in something else

## 2022-06-03 NOTE — Telephone Encounter (Signed)
Rx sent to [facility]

## 2022-06-14 ENCOUNTER — Other Ambulatory Visit: Payer: Self-pay | Admitting: Nurse Practitioner

## 2022-06-15 ENCOUNTER — Ambulatory Visit: Payer: Self-pay | Admitting: Licensed Clinical Social Worker

## 2022-06-15 DIAGNOSIS — F411 Generalized anxiety disorder: Secondary | ICD-10-CM

## 2022-06-15 DIAGNOSIS — F332 Major depressive disorder, recurrent severe without psychotic features: Secondary | ICD-10-CM

## 2022-06-15 NOTE — Patient Instructions (Signed)
  Congratulations on achieving your goals! It was a pleasure working with you, and I hope you continue to make great strides in improving your health.  I left you a voice message today informing you that I will be disconnecting from your care team.  Please call me directly if you have any needs within the next week.  Follow up Plan:  You do not require continued follow-up by CCM LCSW I will disconnect from your care team at this time Please contact the office if needed    Sammuel Hines, LCSW Licensed Clinical Social Worker [facility]  [facility] [phone]

## 2022-06-15 NOTE — Chronic Care Management (AMB) (Signed)
Care Management Clinical Social Work Note  06/15/2022 Name: Michelle Michael MRN: [id number] DOB: 17-Dec-1961  Michelle Michael is a 61 y.o. year old female who is a primary care patient of Michelle Rival, FNP.  The Care Management team was consulted for assistance with chronic disease management and coordination needs.  Assessment: F/U phone call today to assess needs.   Telephone outreach was unsuccessful however review of chart, Patient has completed goal,  has mental health appointments scheduled in July. Left voice message for patient to call if needed.   Follow up Plan:  All care plan goals have been met.  LCSW will disconnect from care team after this encounter.  Patient does not require continued follow-up by CCM LCSW.  Patient  will contact the office if needed  Assessment: Review of patient past medical history, allergies, medications, and health status, including review of relevant consultants reports was performed today as part of a comprehensive evaluation and provision of chronic care management and care coordination services.  SDOH (Social Determinants of Health) assessments and interventions performed:    Care Plan Conditions to be addressed/monitored: Depression;   Care Plan : LCSW Plan of Care  Updates made by Michelle Cane, LCSW since 06/15/2022 12:00 AM     Problem: Needs local mental health provider      Goal: Quality of Life Maintained with local provider Completed 06/15/2022  Start Date: 04/27/2022  This Visit's Progress: On track  Recent Progress: On track  Priority: High  Note:   Current Barriers:  Disease Management support and education needs related to Depression: anxiety  CSW Clinical Goal(s):   will patient will work with [name] to address needs related to managing symptoms of anxiety and depression  through collaboration with Clinical Education officer, museum, provider, and care team.   Interventions: Inter-disciplinary care team collaboration (see  longitudinal plan of care) Evaluation of current treatment plan related to  self management and patient's adherence to plan as established by provider  Mental Health:  (Status: Goal Met.) Evaluation of current treatment plan related to  managing depression and anxiety Solution-Focused Strategies employed:  Active listening / Reflection utilized  Problem [place] strategies reviewed Provided psychoeducation for mental health needs  Provided EMMI education information on Breathing to Relax and Insomnia & getting a good night's sleep Discussed referral for psychiatry: reviewed options base on insurance and location.  Task & activities to accomplish goals: Continue with compliance of taking medication prescribed by Doctor Look for e-mail from [place] : Breathing to Relax and Insomnia & Getting a good night's sleep  Keep appointment with [facility] on July 17th at 8:30 [address]  [email or web address], [address], [place] [address]  Tel: [phone]      Michelle Michael, [place] Licensed Clinical Social Worker Michelle Michael Management  [facility] [phone]

## 2022-06-24 ENCOUNTER — Ambulatory Visit
Admission: RE | Admit: 2022-06-24 | Discharge: 2022-06-24 | Disposition: A | Discharge status: Home / Self Care | Payer: Medicare HMO | Source: Ambulatory Visit | Attending: Nurse Practitioner | Admitting: Nurse Practitioner

## 2022-06-24 DIAGNOSIS — Z1231 Encounter for screening mammogram for malignant neoplasm of breast: Secondary | ICD-10-CM | POA: Diagnosis not present

## 2022-06-29 ENCOUNTER — Telehealth: Payer: Self-pay | Admitting: Nurse Practitioner

## 2022-06-29 DIAGNOSIS — E119 Type 2 diabetes mellitus without complications: Secondary | ICD-10-CM | POA: Diagnosis not present

## 2022-06-29 NOTE — Telephone Encounter (Signed)
Spoke with pt advised of mammo results

## 2022-06-29 NOTE — Progress Notes (Signed)
Normal mammogram, repeat in one year

## 2022-06-29 NOTE — Telephone Encounter (Signed)
Pt returning phone call ° °

## 2022-06-30 ENCOUNTER — Other Ambulatory Visit: Payer: Self-pay | Admitting: Nurse Practitioner

## 2022-06-30 ENCOUNTER — Telehealth: Payer: Self-pay | Admitting: Nurse Practitioner

## 2022-06-30 DIAGNOSIS — K7030 Alcoholic cirrhosis of liver without ascites: Secondary | ICD-10-CM

## 2022-06-30 MED ORDER — BD PEN NEEDLE NANO 2ND GEN 32G X 4 MM MISC: 6 refills | Status: DC

## 2022-06-30 MED ORDER — GABAPENTIN 300 MG PO CAPS
300.0000 mg | ORAL_CAPSULE | Freq: Every day | ORAL | 0 refills | Status: DC
Start: 2022-06-30 — End: 2022-09-22

## 2022-06-30 NOTE — Telephone Encounter (Signed)
I didn't discontinue her Gabapentin 300 mg dose.  Not sure what happened with it, I resent in for the 300 mg tabs and also reordered her insulin pen needles with updated quantity.

## 2022-06-30 NOTE — Telephone Encounter (Signed)
Patient left a message stating she was on 70/30 and it was changed to a pen, she was only on victoza pen. Now she is needing the quantity of pen needles increased since she is needing it for the 70/30 insulin as well.   Patient also states you wanted her taking 600 mg tablet of gabapentin at bedtime and 300mg  gabapentin capsule during day and she said it shows on her end the 300mg  capsule was discontinued?   Requesting cb [phone]

## 2022-07-13 ENCOUNTER — Telehealth: Payer: Medicare HMO

## 2022-07-15 ENCOUNTER — Encounter: Payer: Self-pay | Admitting: Nurse Practitioner

## 2022-07-15 ENCOUNTER — Ambulatory Visit (INDEPENDENT_AMBULATORY_CARE_PROVIDER_SITE_OTHER): Payer: Medicare HMO | Admitting: Nurse Practitioner

## 2022-07-15 VITALS — BP 119/76 | HR 108 | Ht 65.0 in | Wt 186.0 lb

## 2022-07-15 DIAGNOSIS — E119 Type 2 diabetes mellitus without complications: Secondary | ICD-10-CM

## 2022-07-15 DIAGNOSIS — I10 Essential (primary) hypertension: Secondary | ICD-10-CM | POA: Diagnosis not present

## 2022-07-15 DIAGNOSIS — E785 Hyperlipidemia, unspecified: Secondary | ICD-10-CM

## 2022-07-15 DIAGNOSIS — R252 Cramp and spasm: Secondary | ICD-10-CM | POA: Diagnosis not present

## 2022-07-15 DIAGNOSIS — F1020 Alcohol dependence, uncomplicated: Secondary | ICD-10-CM

## 2022-07-15 DIAGNOSIS — R600 Localized edema: Secondary | ICD-10-CM | POA: Diagnosis not present

## 2022-07-15 DIAGNOSIS — M24849 Other specific joint derangements of unspecified hand, not elsewhere classified: Secondary | ICD-10-CM | POA: Insufficient documentation

## 2022-07-15 DIAGNOSIS — F411 Generalized anxiety disorder: Secondary | ICD-10-CM | POA: Diagnosis not present

## 2022-07-15 DIAGNOSIS — Z0001 Encounter for general adult medical examination with abnormal findings: Secondary | ICD-10-CM | POA: Diagnosis not present

## 2022-07-15 DIAGNOSIS — Z0000 Encounter for general adult medical examination without abnormal findings: Secondary | ICD-10-CM | POA: Diagnosis not present

## 2022-07-15 DIAGNOSIS — Z794 Long term (current) use of insulin: Secondary | ICD-10-CM

## 2022-07-15 NOTE — Progress Notes (Signed)
Complete physical exam  Patient: Michelle Michael   DOB: 02/12/1961   61 y.o. Female  MRN: [id number]  Subjective:    Chief Complaint  Patient presents with   Annual Exam    cpe    Michelle Michael with past medical history of hypertension, cirrhosis, type 2 diabetes, major depressive disorder, GAD is a 61 y.o. female who presents today for a complete physical exam. She reports consuming a low sodium diet, low carb diet  currently does not exercise,uses the resistance band  She generally feels well. She reports sleeping fairly well. She does have additional problems to discuss today.     She was referred to psych in [place] but missed her appointment due to transportation problems .  Would like a mental health specialist located in [place]   Pt c/o of leg  cramps that occurs at nighttime since about 2 months, Right index locking up and left hand middle and ring finger locking up .  Patient denies numbness, tingling, pain.  She is currently on gabapentin.     Patient stated that her last PAP was 2 years ago .  She is up-to-date with mammogram, she is due for diabetic eye exam, patient referred to ophthalmologist today   Most recent fall risk assessment:    07/15/2022    9:07 AM  Michelle Michael in the past year? 1  Number falls in past yr: 0  Injury with Fall? 0  Risk for fall due to : History of fall(s)  Follow up Falls evaluation completed     Most recent depression screenings:    07/15/2022    9:08 AM 04/14/2022   10:32 AM  PHQ 2/9 Scores  PHQ - 2 Score 0 0        Patient Care Team: Renee Rival, FNP as PCP - General (Nurse Practitioner) Brita Romp, NP as Referring Physician (Nurse Practitioner)   Outpatient Medications Prior to Visit  Medication Sig   albuterol (VENTOLIN HFA) 108 (90 Base) MCG/ACT inhaler Inhale 2 puffs into the lungs every 6 (six) hours as needed for wheezing or shortness of breath.   BD PEN NEEDLE NANO 2ND GEN 32G X 4 MM  MISC Use to inject insulin and Victoza for 3 injections per day   busPIRone (BUSPAR) 10 MG tablet One tab at 8 Am - 1 PM - 8 PM   Continuous Blood Gluc Receiver (DEXCOM G6 RECEIVER) DEVI by Does not apply route.   Continuous Blood Gluc Sensor (DEXCOM G6 SENSOR) MISC Apply 1 sensor every 10 days.   Continuous Blood Gluc Transmit (DEXCOM G6 TRANSMITTER) MISC Use transmitter every 90 days.   DULoxetine (CYMBALTA) 60 MG capsule Take 60 mg by mouth every morning.   fluticasone (FLONASE) 50 MCG/ACT nasal spray Place 2 sprays into both nostrils daily.   furosemide (LASIX) 40 MG tablet Take 40 mg by mouth daily.   gabapentin (NEURONTIN) 300 MG capsule Take 1 capsule (300 mg total) by mouth daily after breakfast.   gabapentin (NEURONTIN) 600 MG tablet Take 1 tablet (600 mg total) by mouth at bedtime.   Glucagon, rDNA, (GLUCAGON EMERGENCY) 1 MG KIT Inject 1 mg into the muscle as directed.   hydrOXYzine (ATARAX/VISTARIL) 10 MG tablet TAKE 1 Tablet BY MOUTH 3 TIMES DAILY AS NEEDED FOR ITCHING   insulin isophane & regular human KwikPen (NOVOLIN 70/30 KWIKPEN) (70-30) 100 UNIT/ML KwikPen Inject 5-8 Units into the skin 2 (two) times daily with a meal.   INSULIN  SYRINGE .5CC/29G 29G X 1/2" 0.5 ML MISC Use to inject insulin twice daily.   liraglutide (VICTOZA) 18 MG/3ML SOPN Inject 1.8 mg into the skin daily.   metFORMIN (GLUCOPHAGE-XR) 500 MG 24 hr tablet Take 1 tablet by mouth once daily with breakfast   ondansetron (ZOFRAN) 4 MG tablet TAKE 1 TABLET BY MOUTH EVERY 8 HOURS AS NEEDED   pantoprazole (PROTONIX) 40 MG tablet Take 40 mg by mouth daily.   spironolactone (ALDACTONE) 100 MG tablet Take 100 mg by mouth daily.   traZODone (DESYREL) 50 MG tablet Take 1 tablet (50 mg total) by mouth at bedtime as needed for sleep.   Multiple Vitamin (MULTIVITAMIN WITH MINERALS) TABS tablet Take 1 tablet by mouth daily. (Patient not taking: Reported on 07/15/2022)   No facility-administered medications prior to visit.     Review of Systems  Constitutional: Negative.  Negative for chills, diaphoresis, fever, malaise/fatigue and weight loss.  HENT: Negative.  Negative for congestion, ear discharge, ear pain, hearing loss, nosebleeds and tinnitus.   Eyes:  Negative for photophobia, pain, discharge and redness.  Respiratory:  Negative for cough, hemoptysis, sputum production and wheezing.   Cardiovascular:  Positive for leg swelling. Negative for chest pain, palpitations, orthopnea, claudication and PND.  Gastrointestinal:  Negative for abdominal pain, blood in stool, constipation, diarrhea, heartburn, nausea and vomiting.  Genitourinary: Negative.  Negative for dysuria, flank pain, frequency, hematuria and urgency.  Musculoskeletal: Negative.  Negative for back pain, joint pain, myalgias and neck pain.  Skin: Negative.  Negative for itching and rash.  Neurological: Negative.  Negative for dizziness, tingling, tremors, sensory change, speech change and headaches.  Endo/Heme/Allergies:  Negative for environmental allergies and polydipsia. Does not bruise/bleed easily.  Psychiatric/Behavioral:  Negative for hallucinations, substance abuse and suicidal ideas. The patient is not nervous/anxious and does not have insomnia.           Objective:     BP 119/76 (BP Location: Right Arm, Patient Position: Sitting, Cuff Size: Large)   Pulse (!) 108   Ht $R'5\' 5"'iN$  (1.651 m)   Wt 186 lb (84.4 kg)   SpO2 91%   BMI 30.95 kg/m    Physical Exam Constitutional:      General: She is not in acute distress.    Appearance: She is obese. She is not ill-appearing, toxic-appearing or diaphoretic.  HENT:     Head: Normocephalic.     Right Ear: Tympanic membrane, ear canal and external ear normal. There is no impacted cerumen.     Left Ear: Tympanic membrane, ear canal and external ear normal. There is no impacted cerumen.     Nose: Nose normal. No congestion or rhinorrhea.     Mouth/Throat:     Mouth: Mucous membranes are  moist.     Pharynx: Oropharynx is clear. No oropharyngeal exudate or posterior oropharyngeal erythema.  Eyes:     General: No scleral icterus.       Right eye: No discharge.        Left eye: No discharge.     Extraocular Movements: Extraocular movements intact.     Conjunctiva/sclera: Conjunctivae normal.     Pupils: Pupils are equal, round, and reactive to light.  Neck:     Vascular: No carotid bruit.  Cardiovascular:     Rate and Rhythm: Normal rate and regular rhythm.     Pulses: Normal pulses.     Heart sounds: Normal heart sounds. No murmur heard.    No friction rub. No gallop.  Pulmonary:     Effort: Pulmonary effort is normal. No respiratory distress.     Breath sounds: Normal breath sounds. No stridor. No wheezing, rhonchi or rales.  Chest:     Chest wall: No tenderness.  Abdominal:     Palpations: Abdomen is soft. There is no mass.     Tenderness: There is no abdominal tenderness. There is no right CVA tenderness, left CVA tenderness, guarding or rebound.     Hernia: No hernia is present.  Musculoskeletal:        General: No swelling, tenderness, deformity or signs of injury. Normal range of motion.     Cervical back: Normal range of motion. No rigidity or tenderness.     Right lower leg: Edema present.     Left lower leg: Edema present.  Lymphadenopathy:     Cervical: No cervical adenopathy.  Skin:    General: Skin is warm and dry.     Capillary Refill: Capillary refill takes less than 2 seconds.     Coloration: Skin is not jaundiced or pale.     Findings: No bruising, erythema, lesion or rash.  Neurological:     Mental Status: She is alert and oriented to person, place, and time.     Cranial Nerves: No cranial nerve deficit.     Sensory: No sensory deficit.     Motor: No weakness.     Coordination: Coordination normal.     Gait: Gait normal.  Psychiatric:        Mood and Affect: Mood normal.        Behavior: Behavior normal.        Thought Content: Thought  content normal.        Judgment: Judgment normal.      No results found for any visits on 07/15/22.     Assessment & Plan:    Routine Health Maintenance and Physical Exam   There is no immunization history on file for this patient.  Health Maintenance  Topic Date Due   FOOT EXAM  Never done   OPHTHALMOLOGY EXAM  Never done   Zoster Vaccines- Shingrix (1 of 2) Never done   URINE MICROALBUMIN  05/21/2022   PAP SMEAR-Modifier  07/16/2023 (Originally [birth year]/04/1982)   INFLUENZA VACCINE  07/26/2022   HEMOGLOBIN A1C  09/23/2022   MAMMOGRAM  06/24/2024   COLONOSCOPY (Pts 45-21yrs Insurance coverage will need to be confirmed)  01/29/2030   TETANUS/TDAP  11/13/2030   Hepatitis C Screening  Completed   HPV VACCINES  Aged Out   COVID-19 Vaccine  Discontinued   HIV Screening  Discontinued    Discussed health benefits of physical activity, and encouraged her to engage in regular exercise appropriate for her age and condition.  Problem List Items Addressed This Visit       Endocrine   Diabetes mellitus, type II ([place])    Patient encouraged to maintain close follow-up with endocrinologist Referral sent to ophthalmologist for diabetic eye exam Currently on Novolin 70/30, Victoza 1.8 mg daily, metformin 500 mg daily Avoid sugar sweets soda She is not on ACEI or ARB at this time but she is on spironolactone 100 mg daily which should assist with kidney protection.  Will consider adding ARB or ACEI if blood pressure becomes uncontrolled.  Checking lipid panel today if LDL is greater then 70 will refer patient to advanced lipid clinic for Repatha injection      Relevant Orders   Ambulatory referral to Ophthalmology     Other  Alcohol use disorder, severe, dependence ([place])    Continues to drink 5 to 6 glasses of wine weekly Need to avoid alcohol due to her liver condition discussed she verbalized understanding      Dyslipidemia, goal LDL below 70    ldl goal is less than 70 Check  fasting lipid panel today Need for Repatha injection discussed with patient if her LDL is greater then 70 Not on a statin due to her liver cirrhosis      Relevant Orders   Lipid Profile   GAD (generalized anxiety disorder)    On buspirone 10 mg twice daily, Cymbalta 60 mg daily, trazodone 50 mg daily. Patient stated that she missed her last appointment with psych due to lack of transportation She would like a mental health specialist located in [place] Referral sent to CCM today      Relevant Orders   AMB Referral to [facility]   Annual physical exam - Primary    Annual exam as documented.  Counseling done include healthy lifestyle involving committing to 150 minutes of exercise per week, heart healthy diet, and attaining healthy weight. The importance of adequate sleep also discussed.  Regular use of seat belt and home safety were also discussed . Changes in health habits are decided on by patient with goals and time frames set for achieving them. Immunization and cancer screening  needs are specifically addressed at this visit.  Patient encouraged to get her shingles vaccine at the pharmacy.  Up-to-date with Pap mammogram, colonoscopy       Relevant Orders   Lipid Profile   Vitamin D (25 hydroxy)   TSH   Locking finger joint    Patient encouraged to engage in regular exercises as she has not been doing so.  She could have trigger fingers, patient encouraged to engage in regular exercises at this time if her symptoms persist I will refer to orthopedics Check magnesium level today due to complaints of cramps      Leg cramping    Patient encouraged to engage in regular moderate exercises at least 150 minutes weekly. Potassium level is normal check magnesium level      Relevant Orders   Magnesium   Edema leg    Secondary to liver cirrhosis Currently on Lasix 40 mg daily, spironolactone 100 mg daily DASH diet advised Patient told to wear compression socks  to help decrease leg swelling.      Return in about 5 months (around 12/15/2022) for HTN/HLD/DM.     Renee Rival, FNP

## 2022-07-15 NOTE — Assessment & Plan Note (Signed)
Patient encouraged to engage in regular moderate exercises at least 150 minutes weekly. Potassium level is normal check magnesium level

## 2022-07-15 NOTE — Assessment & Plan Note (Addendum)
Patient encouraged to engage in regular exercises as she has not been doing so.  She could have trigger fingers, patient encouraged to engage in regular exercises at this time if her symptoms persist I will refer to orthopedics Check magnesium level today due to complaints of cramps

## 2022-07-15 NOTE — Assessment & Plan Note (Signed)
Annual exam as documented.  Counseling done include healthy lifestyle involving committing to 150 minutes of exercise per week, heart healthy diet, and attaining healthy weight. The importance of adequate sleep also discussed.  Regular use of seat belt and home safety were also discussed . Changes in health habits are decided on by patient with goals and time frames set for achieving them. Immunization and cancer screening  needs are specifically addressed at this visit.  Patient encouraged to get her shingles vaccine at the pharmacy.  Up-to-date with Pap mammogram, colonoscopy

## 2022-07-15 NOTE — Assessment & Plan Note (Addendum)
ldl goal is less than 70 Check fasting lipid panel today Need for Repatha injection discussed with patient if her LDL is greater then 70 Not on a statin due to her liver cirrhosis

## 2022-07-15 NOTE — Patient Instructions (Signed)
Please get your shingles vaccine at the pharmacy   It is important that you exercise regularly at least 30 minutes 5 times a week.  Think about what you will eat, plan ahead. Choose " clean, green, fresh or frozen" over canned, processed or packaged foods which are more sugary, salty and fatty. 70 to 75% of food eaten should be vegetables and fruit. Three meals at set times with snacks allowed between meals, but they must be fruit or vegetables. Aim to eat over a 12 hour period , example 7 am to 7 pm, and STOP after  your last meal of the day. Drink water,generally about 64 ounces per day, no other drink is as healthy. Fruit juice is best enjoyed in a healthy way, by EATING the fruit.  Thanks for choosing [facility], we consider it a privelige to serve you.  

## 2022-07-15 NOTE — Assessment & Plan Note (Signed)
Continues to drink 5 to 6 glasses of wine weekly Need to avoid alcohol due to her liver condition discussed she verbalized understanding

## 2022-07-15 NOTE — Assessment & Plan Note (Signed)
Secondary to liver cirrhosis Currently on Lasix 40 mg daily, spironolactone 100 mg daily DASH diet advised Patient told to wear compression socks to help decrease leg swelling.

## 2022-07-15 NOTE — Assessment & Plan Note (Signed)
On buspirone 10 mg twice daily, Cymbalta 60 mg daily, trazodone 50 mg daily. Patient stated that she missed her last appointment with psych due to lack of transportation She would like a mental health specialist located in [place] Referral sent to CCM today

## 2022-07-15 NOTE — Assessment & Plan Note (Addendum)
Patient encouraged to maintain close follow-up with endocrinologist Referral sent to ophthalmologist for diabetic eye exam Currently on Novolin 70/30, Victoza 1.8 mg daily, metformin 500 mg daily Avoid sugar sweets soda She is not on ACEI or ARB at this time but she is on spironolactone 100 mg daily which should assist with kidney protection.  Will consider adding ARB or ACEI if blood pressure becomes uncontrolled.  Checking lipid panel today if LDL is greater then 70 will refer patient to advanced lipid clinic for Repatha injection

## 2022-07-16 ENCOUNTER — Other Ambulatory Visit: Payer: Self-pay | Admitting: Nurse Practitioner

## 2022-07-16 DIAGNOSIS — E785 Hyperlipidemia, unspecified: Secondary | ICD-10-CM

## 2022-07-16 DIAGNOSIS — E559 Vitamin D deficiency, unspecified: Secondary | ICD-10-CM

## 2022-07-16 LAB — LIPID PANEL
Chol/HDL Ratio: 3.1 ratio (ref 0.0–4.4)
Cholesterol, Total: 229 mg/dL — ABNORMAL HIGH (ref 100–199)
HDL: 74 mg/dL (ref >39)
LDL Chol Calc (NIH): 136 mg/dL — ABNORMAL HIGH (ref 0–99)
Triglycerides: 109 mg/dL (ref 0–149)
VLDL Cholesterol Cal: 19 mg/dL (ref 5–40)

## 2022-07-16 LAB — TSH: TSH: 1.35 u[IU]/mL (ref 0.450–4.500)

## 2022-07-16 LAB — MAGNESIUM: Magnesium: 1.4 mg/dL — ABNORMAL LOW (ref 1.6–2.3)

## 2022-07-16 LAB — VITAMIN D 25 HYDROXY (VIT D DEFICIENCY, FRACTURES): Vit D, 25-Hydroxy: 6 ng/mL — ABNORMAL LOW (ref 30.0–100.0)

## 2022-07-16 MED ORDER — VITAMIN D (ERGOCALCIFEROL) 1.25 MG (50000 UNIT) PO CAPS: 50000.0000 [IU] | ORAL_CAPSULE | ORAL | 0 refills | Status: DC

## 2022-07-16 MED ORDER — MAGNESIUM OXIDE 400 MG PO CAPS: 400.0000 mg | ORAL_CAPSULE | Freq: Two times a day (BID) | ORAL | 0 refills | Status: DC

## 2022-07-16 NOTE — Progress Notes (Signed)
Hyperlipidemia.  I have referred patient to the advanced lipid management clinic for Repatha injection.   Vitamin D deficiency.  Patient should take vitamin D 50,000 units once weekly  Low magnesium.  Patient should take magnesium oxide 400 mg twice daily for 2 days.  Eats foods rich in magnesium like avocado, soyamilk

## 2022-07-18 ENCOUNTER — Other Ambulatory Visit: Payer: Self-pay | Admitting: Nurse Practitioner

## 2022-07-19 ENCOUNTER — Other Ambulatory Visit: Payer: Self-pay | Admitting: Nurse Practitioner

## 2022-07-19 ENCOUNTER — Encounter: Payer: Self-pay | Admitting: Nurse Practitioner

## 2022-07-19 NOTE — Progress Notes (Unsigned)
epath

## 2022-07-19 NOTE — Telephone Encounter (Signed)
Please advise 

## 2022-07-20 ENCOUNTER — Other Ambulatory Visit: Payer: Self-pay

## 2022-07-20 DIAGNOSIS — E785 Hyperlipidemia, unspecified: Secondary | ICD-10-CM

## 2022-07-25 ENCOUNTER — Encounter: Payer: Self-pay | Admitting: Nurse Practitioner

## 2022-07-25 ENCOUNTER — Ambulatory Visit (INDEPENDENT_AMBULATORY_CARE_PROVIDER_SITE_OTHER): Payer: Medicare HMO | Admitting: Nurse Practitioner

## 2022-07-25 VITALS — BP 116/73 | HR 108 | Ht 65.0 in | Wt 186.0 lb

## 2022-07-25 DIAGNOSIS — E559 Vitamin D deficiency, unspecified: Secondary | ICD-10-CM | POA: Diagnosis not present

## 2022-07-25 DIAGNOSIS — E782 Mixed hyperlipidemia: Secondary | ICD-10-CM

## 2022-07-25 DIAGNOSIS — E119 Type 2 diabetes mellitus without complications: Secondary | ICD-10-CM | POA: Diagnosis not present

## 2022-07-25 DIAGNOSIS — I10 Essential (primary) hypertension: Secondary | ICD-10-CM | POA: Diagnosis not present

## 2022-07-25 DIAGNOSIS — Z794 Long term (current) use of insulin: Secondary | ICD-10-CM | POA: Diagnosis not present

## 2022-07-25 LAB — POCT GLYCOSYLATED HEMOGLOBIN (HGB A1C): HbA1c, POC (controlled diabetic range): 5.9 % (ref 0.0–7.0)

## 2022-07-25 MED ORDER — BD PEN NEEDLE NANO 2ND GEN 32G X 4 MM MISC: 6 refills | Status: DC

## 2022-07-25 MED ORDER — VITAMIN D (ERGOCALCIFEROL) 1.25 MG (50000 UNIT) PO CAPS: 50000.0000 [IU] | ORAL_CAPSULE | ORAL | 1 refills | Status: DC

## 2022-07-25 NOTE — Progress Notes (Signed)
Endocrinology Follow Up Note       07/25/2022, 11:11 AM   Subjective:    Patient ID: Michelle Michael, female    DOB: [birth year].  Hendrix Console is being seen in follow up after being in seen in consultation for management of currently uncontrolled symptomatic diabetes requested by  Renee Rival, FNP.   Past Medical History:  Diagnosis Date   Anemia    Cirrhosis ([place])    CKD (chronic kidney disease)    Depression    Diabetes mellitus, type II ([place])    Hyperlipidemia    Hypertension    Hypokalemia    Hyponatremia     Past Surgical History:  Procedure Laterality Date   CHOLECYSTECTOMY     ORTHOPEDIC SURGERY      Social History   Socioeconomic History   Marital status: Divorced    Spouse name: Not on file   Number of children: 2   Years of education: Not on file   Highest education level: Not on file  Occupational History   Not on file  Tobacco Use   Smoking status: Every Day    Packs/day: 1.00    Types: Cigarettes   Smokeless tobacco: Never  Vaping Use   Vaping Use: Former  Substance and Sexual Activity   Alcohol use: Yes    Comment: "2 quartz of beer a day"   Drug use: No   Sexual activity: Not on file  Other Topics Concern   Not on file  Social History Narrative   Lives with her room mate    Social Determinants of Health   Financial Resource Strain: Low Risk  (04/27/2022)   Overall Financial Resource Strain (CARDIA)    Difficulty of Paying Living Expenses: Not hard at all  Food Insecurity: No Food Insecurity (04/27/2022)   Hunger Vital Sign    Worried About Running Out of Food in the Last Year: Never true    [place] in the Last Year: Never true  Transportation Needs: No Transportation Needs (04/27/2022)   PRAPARE - Hydrologist (Medical): No    Lack of Transportation (Non-Medical): No  Physical Activity: Insufficiently Active (04/27/2022)    Exercise Vital Sign    Days of Exercise per Week: 2 days    Minutes of Exercise per Session: 30 min  Stress: Stress Concern Present (04/27/2022)   [place]    Feeling of Stress : To some extent  Social Connections: Not on file    Family History  Problem Relation Age of Onset   Diabetes Mother    Hypertension Mother    Hypertension Father    Breast cancer Neg Hx     Outpatient Encounter Medications as of 07/25/2022  Medication Sig   albuterol (VENTOLIN HFA) 108 (90 Base) MCG/ACT inhaler Inhale 2 puffs into the lungs every 6 (six) hours as needed for wheezing or shortness of breath.   busPIRone (BUSPAR) 10 MG tablet One tab at 8 Am - 1 PM - 8 PM   Continuous Blood Gluc Receiver ([place]) DEVI by Does not apply route.   Continuous Blood Gluc Sensor (  DEXCOM G6 SENSOR) MISC Apply 1 sensor every 10 days.   Continuous Blood Gluc Transmit (DEXCOM G6 TRANSMITTER) MISC Use transmitter every 90 days.   DULoxetine (CYMBALTA) 60 MG capsule Take 60 mg by mouth every morning.   fluticasone (FLONASE) 50 MCG/ACT nasal spray Place 2 sprays into both nostrils daily.   furosemide (LASIX) 40 MG tablet Take 40 mg by mouth daily.   gabapentin (NEURONTIN) 300 MG capsule Take 1 capsule (300 mg total) by mouth daily after breakfast.   gabapentin (NEURONTIN) 600 MG tablet TAKE 1 TABLET BY MOUTH AT BEDTIME   Glucagon, rDNA, (GLUCAGON EMERGENCY) 1 MG KIT Inject 1 mg into the muscle as directed.   hydrOXYzine (ATARAX/VISTARIL) 10 MG tablet TAKE 1 Tablet BY MOUTH 3 TIMES DAILY AS NEEDED FOR ITCHING   insulin isophane & regular human KwikPen (NOVOLIN 70/30 KWIKPEN) (70-30) 100 UNIT/ML KwikPen Inject 5-8 Units into the skin 2 (two) times daily with a meal. (Patient taking differently: Inject 5-8 Units into the skin 2 (two) times daily with a meal. Pt taking 8-10 in the am and 5-8 at night)   Insulin Pen Needle (BD PEN NEEDLE NANO 2ND GEN) 32G  X 4 MM MISC Use to inject insulin and Victoza for 3 injections per day   INSULIN SYRINGE .5CC/29G 29G X 1/2" 0.5 ML MISC Use to inject insulin twice daily.   liraglutide (VICTOZA) 18 MG/3ML SOPN Inject 1.8 mg into the skin daily.   Magnesium Oxide 400 MG CAPS Take 1 capsule (400 mg total) by mouth 2 (two) times daily.   metFORMIN (GLUCOPHAGE-XR) 500 MG 24 hr tablet Take 1 tablet by mouth once daily with breakfast (Patient taking differently: Pt takes in the afternoon)   Multiple Vitamin (MULTIVITAMIN WITH MINERALS) TABS tablet Take 1 tablet by mouth daily. (Patient not taking: Reported on 07/15/2022)   ondansetron (ZOFRAN) 4 MG tablet TAKE 1 TABLET BY MOUTH EVERY 8 HOURS AS NEEDED   pantoprazole (PROTONIX) 40 MG tablet Take 40 mg by mouth daily.   spironolactone (ALDACTONE) 100 MG tablet Take 100 mg by mouth daily.   traZODone (DESYREL) 50 MG tablet Take 1 tablet (50 mg total) by mouth at bedtime as needed for sleep.   Vitamin D, Ergocalciferol, (DRISDOL) 1.25 MG (50000 UNIT) CAPS capsule Take 1 capsule (50,000 Units total) by mouth every 7 (seven) days.   [DISCONTINUED] BD PEN NEEDLE NANO 2ND GEN 32G X 4 MM MISC Use to inject insulin and Victoza for 3 injections per day   [DISCONTINUED] Vitamin D, Ergocalciferol, (DRISDOL) 1.25 MG (50000 UNIT) CAPS capsule Take 1 capsule (50,000 Units total) by mouth every 7 (seven) days.   No facility-administered encounter medications on file as of 07/25/2022.    ALLERGIES: No Known Allergies  VACCINATION STATUS:  There is no immunization history on file for this patient.  Diabetes She presents for her follow-up diabetic visit. She has type 2 diabetes mellitus. Onset time: Diagnosed at approx age of 20. Her disease course has been improving. Hypoglycemia symptoms include sweats and tremors. Associated symptoms include fatigue and foot paresthesias. There are no hypoglycemic complications. Symptoms are stable. Diabetic complications include nephropathy and  peripheral neuropathy. Risk factors for coronary artery disease include diabetes mellitus, dyslipidemia, family history, hypertension, tobacco exposure and sedentary lifestyle. Current diabetic treatment includes oral agent (monotherapy) and insulin injections (and Victoza). She is compliant with treatment most of the time. Her weight is fluctuating minimally. She is following a generally healthy diet. When asked about meal planning, she reported  none. She has not had a previous visit with a dietitian. She participates in exercise intermittently. Her home blood glucose trend is decreasing steadily. Her overall blood glucose range is 140-180 mg/dl. (She presents today with her CGM showing at target glycemic profile overall.  Her POCT A1c today is 5.9%, increasing slightly from last visit of 5.6%.  Analysis of her CGM shows TIR 59%, TAR 40%, TBR < 2% with a GMI of 7.3%.  She does have some mild fasting hypoglycemia noted at times.) An ACE inhibitor/angiotensin II receptor blocker is not being taken. She does not see a podiatrist.Eye exam is current.  Hypertension This is a chronic problem. The current episode started more than 1 year ago. The problem has been resolved since onset. The problem is controlled. Associated symptoms include sweats. There are no associated agents to hypertension. Risk factors for coronary artery disease include diabetes mellitus, dyslipidemia, smoking/tobacco exposure, family history and sedentary lifestyle. Past treatments include diuretics. The current treatment provides mild improvement. Compliance problems include diet, exercise and psychosocial issues.   Hyperlipidemia This is a chronic problem. The current episode started more than 1 year ago. The problem is uncontrolled. Recent lipid tests were reviewed and are high. Exacerbating diseases include diabetes. Factors aggravating her hyperlipidemia include smoking and fatty foods. She is currently on no antihyperlipidemic treatment.  Compliance problems include adherence to diet, adherence to exercise and psychosocial issues.  Risk factors for coronary artery disease include diabetes mellitus, dyslipidemia, family history, hypertension and a sedentary lifestyle.     Review of systems  Constitutional: + Minimally fluctuating body weight, current Body mass index is 30.95 kg/m., + fatigue, no subjective hyperthermia, no subjective hypothermia Eyes: no blurry vision, no xerophthalmia ENT: no sore throat, no nodules palpated in throat, no dysphagia/odynophagia, no hoarseness Cardiovascular: no chest pain, no shortness of breath, no palpitations, no leg swelling Respiratory: chronic cough-smoker, no shortness of breath Gastrointestinal: no vomiting/diarrhea Musculoskeletal: no muscle/joint aches Skin: no rashes, no hyperemia Neurological: no tremors, + numbness/tingling to BLE- worsening, no dizziness Psychiatric: hx depression- controlled on meds, no anxiety, reports increased stress levels  Objective:     BP 116/73   Pulse (!) 108   Ht _0  (1.651 m)   Wt 186 lb (84.4 kg)   BMI 30.95 kg/m   Wt Readings from Last 3 Encounters:  07/25/22 186 lb (84.4 kg)  07/15/22 186 lb (84.4 kg)  05/18/22 190 lb 3.2 oz (86.3 kg)     BP Readings from Last 3 Encounters:  07/25/22 116/73  07/15/22 119/76  05/18/22 120/68     Physical Exam- Limited  Constitutional:  Body mass index is 30.95 kg/m. , not in acute distress Eyes:  EOMI, no exophthalmos Neck: Supple Cardiovascular: RRR, no murmurs, rubs, or gallops, no edema Respiratory: Adequate breathing efforts, no crackles, rales, rhonchi Musculoskeletal: no gross deformities, strength intact in all four extremities, no gross restriction of joint movements Skin:  no rashes, no hyperemia, nicotinic discoloration to fingernails Neurological: no tremor with outstretched hands    CMP ( most recent) CMP     Component Value Date/Time   NA 137 05/31/2022 0918   K 3.8  05/31/2022 0918   CL 99 05/31/2022 0918   CO2 30 05/31/2022 0918   GLUCOSE 130 (H) 05/31/2022 0918   BUN 6 05/31/2022 0918   CREATININE 0.64 05/31/2022 0918   CALCIUM 8.9 05/31/2022 0918   PROT 6.8 05/31/2022 0918   ALBUMIN 3.3 (L) 05/31/2022 0918   AST 91 (H)  05/31/2022 0918   ALT 30 05/31/2022 0918   ALKPHOS 272 (H) 05/31/2022 0918   BILITOT 0.8 05/31/2022 0918   GFRNONAA >60 05/31/2022 0918   GFRAA >60 06/20/2015 0420     Diabetic Labs (most recent): Lab Results  Component Value Date   HGBA1C 5.9 07/25/2022   HGBA1C 5.6 03/23/2022   HGBA1C 5.8 11/23/2021   MICROALBUR 2 05/21/2021     Lipid Panel ( most recent) Lipid Panel     Component Value Date/Time   CHOL 229 (H) 07/15/2022 1006   TRIG 109 07/15/2022 1006   HDL 74 07/15/2022 1006   CHOLHDL 3.1 07/15/2022 1006   LDLCALC 136 (H) 07/15/2022 1006   LABVLDL 19 07/15/2022 1006      Lab Results  Component Value Date   TSH 1.350 07/15/2022   TSH 1.036 06/22/2015           Assessment & Plan:   1) Type 2 diabetes mellitus without complication, with long-term current use of insulin ([place])  She presents today with her CGM showing at target glycemic profile overall.  Her POCT A1c today is 5.9%, increasing slightly from last visit of 5.6%.  Analysis of her CGM shows TIR 59%, TAR 40%, TBR < 2% with a GMI of 7.3%.  She does have some mild fasting hypoglycemia noted at times.  - Sheala Dosh has currently uncontrolled symptomatic type 2 DM since 61 years of age.  -Recent labs reviewed.  - I had a long discussion with her about the progressive nature of diabetes and the pathology behind its complications. -her diabetes is complicated by smoking, peripheral neuropathy and she remains at a high risk for more acute and chronic complications which include CAD, CVA, CKD, retinopathy, and neuropathy. These are all discussed in detail with her.  - Nutritional counseling repeated at each appointment due to patients tendency  to fall back in to old habits.  - The patient admits there is a room for improvement in their diet and drink choices. -  Suggestion is made for the patient to avoid simple carbohydrates from their diet including Cakes, Sweet Desserts / Pastries, Ice Cream, Soda (diet and regular), Sweet Tea, Candies, Chips, Cookies, Sweet Pastries, Store Bought Juices, Alcohol in Excess of 1-2 drinks a day, Artificial Sweeteners, Coffee Creamer, and "Sugar-free" Products. This will help patient to have stable blood glucose profile and potentially avoid unintended weight gain.   - I encouraged the patient to switch to unprocessed or minimally processed complex starch and increased protein intake (animal or plant source), fruits, and vegetables.   - Patient is advised to stick to a routine mealtimes to eat 3 meals a day and avoid unnecessary snacks (to snack only to correct hypoglycemia).  - she will be scheduled with Jearld Fenton, RDN, CDE for diabetes education.  Has an appt with her in December.  - I have approached her with the following individualized plan to manage her diabetes and patient agrees:   -She is advised to continue her premixed insulin 70/30 to 8-10 units with breakfast and 5-8 units with supper if glucose is above 90 and she is eating.   She can continue Metformin 500 mg ER daily and Victoza 1.8 mg SQ daily.   -she is encouraged to continue using her CGM to monitor her glucose 4 times daily, before meals and before bed, and to call the clinic if she has readings less than 70 or above 200 for 3 tests in a row.    -  she is warned not to take insulin without proper monitoring per orders. - Adjustment parameters are given to her for hypo and hyperglycemia in writing.  - She is not an ideal candidate for incretin therapy due to her heavy smoking history increasing her risk of pancreatitis but since she has already been on Victoza for quite some time, will allow her to continue it.  We did discuss  the s/s of pancreatitis to watch for.  - Specific targets for  A1c; LDL, HDL, and Triglycerides were discussed with the patient.  2) Blood Pressure /Hypertension:  her blood pressure is controlled to target today.   she is advised to continue her current medications including Lasix 40 mg po daily, Spironolactone 100 mg p.o. daily with breakfast.  3) Lipids/Hyperlipidemia:    Review of her recent lipid panel from 07/15/22 showed uncontrolled LDL at 136 . She is not currently on any lipid lowering medications.    4)  Weight/Diet:  her Body mass index is 30.95 kg/m.  -  clearly complicating her diabetes care.   she is a candidate for weight loss. I discussed with her the fact that loss of 5 - 10% of her  current body weight will have the most impact on her diabetes management.  Exercise, and detailed carbohydrates information provided  -  detailed on discharge instructions.  5) Chronic Care/Health Maintenance: -she is not on ACEI/ARB or Statin medications and is encouraged to initiate and continue to follow up with Ophthalmology, Dentist, Podiatrist at least yearly or according to recommendations, and advised to [place]. I have recommended yearly flu vaccine and pneumonia vaccine at least every 5 years; moderate intensity exercise for up to 150 minutes weekly; and sleep for at least 7 hours a day.  6) Vitamin D deficiency: Her recent vitamin D level was significantly low at 6 on 07/15/22.  Her PCP already started her on Ergocalciferol 50000 units weekly, I extended the treatment as she will most likely need it for 6 months or so to boost her levels to normal.    - she is advised to maintain close follow up with Pased[facility], Dewaine Conger, FNP for primary care needs, as well as her other providers for optimal and coordinated care.      I spent 49 minutes in the care of the patient today including review of labs from [place], Lipids, Thyroid Function, Hematology (current and previous including  abstractions from other facilities); face-to-face time discussing  her blood glucose readings/logs, discussing hypoglycemia and hyperglycemia episodes and symptoms, medications doses, her options of short and long term treatment based on the latest standards of care / guidelines;  discussion about incorporating lifestyle medicine;  and documenting the encounter. Risk reduction counseling performed per USPSTF guidelines to reduce obesity and cardiovascular risk factors.     Please refer to Patient Instructions for Blood Glucose Monitoring and Insulin/Medications Dosing Guide"  in media tab for additional information. Please  also refer to " Patient Self Inventory" in the Media  tab for reviewed elements of pertinent patient history.  [name] participated in the discussions, expressed understanding, and voiced agreement with the above plans.  All questions were answered to her satisfaction. she is encouraged to contact clinic should she have any questions or concerns prior to her return visit.     Follow up plan: - Return in about 4 months (around 11/24/2022) for Diabetes F/U with A1c in office, No previsit labs, Bring meter and logs.    Rayetta Pigg, FNP-BC  [facility] [address] [place], [place] [address] Phone: [phone] Fax: [phone]  07/25/2022, 11:11 AM

## 2022-07-26 ENCOUNTER — Encounter: Payer: Self-pay | Admitting: Nurse Practitioner

## 2022-07-28 ENCOUNTER — Telehealth: Payer: Self-pay | Admitting: *Deleted

## 2022-07-28 NOTE — Telephone Encounter (Signed)
   Telephone encounter was:  Unsuccessful.  07/28/2022 Name: Michelle Michael MRN: [id number] DOB: May 04, 1961  Unsuccessful outbound call made today to assist with:   depressive   Outreach Attempt:  1st Attempt  A HIPAA compliant voice message was left requesting a return call.  Instructed patient to call back at   Instructed patient to call back at [phone]  at their earliest convenience.   Yehuda Mao Greenauer -[facility], Population Health [phone] [address] [place] , [place] [address] Email : [email or web address]

## 2022-07-29 ENCOUNTER — Telehealth: Payer: Self-pay | Admitting: *Deleted

## 2022-07-29 NOTE — Telephone Encounter (Signed)
   Telephone encounter was:  Successful.  07/29/2022 Name: Michelle Michael MRN: [id number] DOB: [birth year]  Michelle Michael is a 61 y.o. year old female who is a primary care patient of Donell Beers, FNP . The community resource team was consulted for assistance with Transportation Needs  and Closer referrals   Care guide performed the following interventions: Patient provided with information about care guide support team and interviewed to confirm resource needs Follow up call placed to community resources to determine status of patients referral.  Her issue is the referral she has been given for [place] and cardiology they are in locations that are further away and this makes going that far a draining experience .  Patient would like a Therapist, sports and a cardiologist referral to providers in [place] where she lives and can get to easily   Follow Up Plan:  No further follow up planned at this time. The patient has been provided with needed resources.  Michelle Michael -[facility], Population Health [phone] [address] [place] , [place] [address] Email : [email or web address]

## 2022-08-18 ENCOUNTER — Other Ambulatory Visit: Payer: Self-pay | Admitting: Nurse Practitioner

## 2022-08-18 DIAGNOSIS — K7030 Alcoholic cirrhosis of liver without ascites: Secondary | ICD-10-CM

## 2022-08-26 ENCOUNTER — Other Ambulatory Visit: Payer: Self-pay | Admitting: Nurse Practitioner

## 2022-08-26 DIAGNOSIS — E119 Type 2 diabetes mellitus without complications: Secondary | ICD-10-CM

## 2022-08-26 DIAGNOSIS — F17200 Nicotine dependence, unspecified, uncomplicated: Secondary | ICD-10-CM

## 2022-09-06 ENCOUNTER — Encounter: Payer: Self-pay | Admitting: Nurse Practitioner

## 2022-09-06 DIAGNOSIS — F419 Anxiety disorder, unspecified: Secondary | ICD-10-CM | POA: Diagnosis not present

## 2022-09-06 DIAGNOSIS — F329 Major depressive disorder, single episode, unspecified: Secondary | ICD-10-CM | POA: Diagnosis not present

## 2022-09-17 DIAGNOSIS — E119 Type 2 diabetes mellitus without complications: Secondary | ICD-10-CM | POA: Diagnosis not present

## 2022-09-22 ENCOUNTER — Other Ambulatory Visit: Payer: Self-pay | Admitting: Nurse Practitioner

## 2022-09-22 DIAGNOSIS — K7030 Alcoholic cirrhosis of liver without ascites: Secondary | ICD-10-CM

## 2022-09-22 DIAGNOSIS — F17200 Nicotine dependence, unspecified, uncomplicated: Secondary | ICD-10-CM

## 2022-09-22 MED ORDER — FLUTICASONE PROPIONATE 50 MCG/ACT NA SUSP: 2.0000 | Freq: Every day | NASAL | 2 refills | Status: DC

## 2022-09-22 MED ORDER — ALBUTEROL SULFATE HFA 108 (90 BASE) MCG/ACT IN AERS: 2.0000 | INHALATION_SPRAY | Freq: Four times a day (QID) | RESPIRATORY_TRACT | 0 refills | Status: DC | PRN

## 2022-09-22 MED ORDER — METFORMIN HCL ER 500 MG PO TB24: 500.0000 mg | ORAL_TABLET | Freq: Every day | ORAL | 3 refills | Status: DC

## 2022-09-22 MED ORDER — GABAPENTIN 600 MG PO TABS: 600.0000 mg | ORAL_TABLET | Freq: Two times a day (BID) | ORAL | 1 refills | Status: DC

## 2022-09-22 MED ORDER — ONDANSETRON HCL 4 MG PO TABS: 4.0000 mg | ORAL_TABLET | Freq: Three times a day (TID) | ORAL | 0 refills | Status: DC | PRN

## 2022-10-04 ENCOUNTER — Telehealth: Payer: Self-pay | Admitting: *Deleted

## 2022-10-04 ENCOUNTER — Other Ambulatory Visit: Payer: Self-pay | Admitting: Nurse Practitioner

## 2022-10-04 NOTE — Chronic Care Management (AMB) (Signed)
  Care Coordination   Note   10/04/2022 Name: Michelle Michael MRN: [id number] DOB: [birth year]  Michelle Michael is a 61 y.o. year old female who sees Paseda, Dewaine Conger, FNP for primary care. I reached out to [name] by phone today to offer care coordination services.  Michelle Michael was given information about Care Coordination services today including:   The Care Coordination services include support from the care team which includes your Nurse Coordinator, Clinical Social Worker, or Pharmacist.  The Care Coordination team is here to help remove barriers to the health concerns and goals most important to you. Care Coordination services are voluntary, and the patient may decline or stop services at any time by request to their care team member.   Care Coordination Consent Status: Patient agreed to services and verbal consent obtained.   Follow up plan:  Telephone appointment with care coordination team member scheduled for:  10/07/22  Encounter Outcome:  Pt. Scheduled  [place]  Direct Dial: [phone]

## 2022-10-04 NOTE — Chronic Care Management (AMB) (Signed)
  Care Coordination  Outreach Note  10/04/2022 Name: Michelle Michael MRN: [id number] DOB: 11/27/1961   Care Coordination Outreach Attempts: An unsuccessful telephone outreach was attempted today to offer the patient information about available care coordination services as a benefit of their health plan.   Follow Up Plan:  Additional outreach attempts will be made to offer the patient care coordination information and services.   Encounter Outcome:  No Answer  [place]  Direct Dial: [phone]

## 2022-10-07 ENCOUNTER — Ambulatory Visit: Payer: Self-pay | Admitting: *Deleted

## 2022-10-07 ENCOUNTER — Encounter: Payer: Self-pay | Admitting: *Deleted

## 2022-10-07 NOTE — Patient Instructions (Signed)
Visit Information  Thank you for taking time to visit with me today. Please don't hesitate to contact me if I can be of assistance to you.   Following are the goals we discussed today:   Goals Addressed               This Visit's Progress     COMPLETED: Referral to Diabetic Optometrist. (pt-stated)   On track     Care Coordination Interventions:  Assessed Social Determinant of Health Barriers. Provided Education to Patient About Advanced Directives Completion - Patient Denied Interest. Discussed Plans with Patient for Ongoing Care Management Follow Up. Provided Patient with Direct Contact Information for Care Management Team. Screened for Signs and Symptoms of Depression Related to Chronic Disease State.  DGL8/VFI4 Depression Screen Completed & Results Reviewed with Patient.  Suicidal Ideation/Homicidal Ideation Assessed - None Present. Active Listening/Reflection Utilized.  Problem Solving/Task Centered Solutions Developed.   Quality of Sleep Assessed & Sleep Hygiene Techniques Promoted. Product/process development scientist of Reputable Diabetic Optomet[facility] in [place], [place] - Confirmed Receipt.        Please call the care guide team at [phone] if you need to cancel or reschedule your appointment.   If you are experiencing a Mental Health or [place] or need someone to talk to, please call the Suicide and Crisis Lifeline: 988 call the [place] National Suicide Prevention Lifeline: 236-713-9368 or TTY: 980-148-6475 TTY 401-632-7945) to talk to a trained counselor call 1-800-273-TALK (toll free, 24 hour hotline) go to [facility] [address], [place] [phone]) call the [place]: [phone] call 911  Patient verbalizes understanding of instructions and care plan provided today and agrees to view in [place]. Active MyChart status and patient understanding of how to access instructions and care plan via  MyChart confirmed with patient.     No further follow up required.  Nat Christen, BSW, MSW, LCSW  Licensed Education officer, [facility]  M[address] [place] [address], [place], [place] [address] Physical Address-[address], [place], [place] [address] Toll Free Main # [phone] Fax # [phone] Cell # [phone] [email or web address]

## 2022-10-07 NOTE — Patient Outreach (Signed)
  Care Coordination   Initial Visit Note   10/07/2022  Name: Michelle Michael MRN: [id number] DOB: 06-Feb-1961  Michelle Michael is a 61 y.o. year old female who sees [facility], Dewaine Conger, FNP for primary care. I spoke with [name] by phone today.  What matters to the patients health and wellness today?  Referral to Diabetic Optometrist.    Goals Addressed               This Visit's Progress     COMPLETED: Referral to Diabetic Optometrist. (pt-stated)   On track     Care Coordination Interventions:  Assessed Social Determinant of Health Barriers. Provided Education to Patient About Advanced Directives Completion - Patient Denied Interest. Discussed Plans with Patient for Ongoing Care Management Follow Up. Provided Patient with Direct Contact Information for Care Management Team. Screened for Signs and Symptoms of Depression Related to Chronic Disease State.  JYN8/GNF6 Depression Screen Completed & Results Reviewed with Patient.  Suicidal Ideation/Homicidal Ideation Assessed - None Present. Active Listening/Reflection Utilized.  Problem Solving/Task Centered Solutions Developed.   Quality of Sleep Assessed & Sleep Hygiene Techniques Promoted. Product/process development scientist of Reputable [facility] in [place], [place] - Confirmed Receipt.         SDOH assessments and interventions completed:  Yes.  SDOH Interventions Today    Flowsheet Row Most Recent Value  SDOH Interventions   Food Insecurity Interventions Intervention Not Indicated  Housing Interventions Intervention Not Indicated  Transportation Interventions Intervention Not Indicated  Utilities Interventions Intervention Not Indicated  Alcohol Usage Interventions Intervention Not Indicated (Score <7)  Financial Strain Interventions Intervention Not Indicated  Physical Activity Interventions Intervention Not Indicated  Stress Interventions Intervention Not Indicated  Social Connections Interventions Intervention Not  Indicated        Care Coordination Interventions Activated:  Yes.   Care Coordination Interventions:  Yes, provided.   Follow up plan: No further intervention required.   Encounter Outcome:  Pt. Visit Completed.   Nat Christen, BSW, MSW, LCSW  Licensed Education officer, [facility]  Mailing [place] [address], [place], [place] [address] Physical Address-[address] [place], [place] [address] Toll Free Main # [phone] Fax # [phone] Cell # [phone] [email or web address]

## 2022-10-11 DIAGNOSIS — F331 Major depressive disorder, recurrent, moderate: Secondary | ICD-10-CM | POA: Diagnosis not present

## 2022-10-11 DIAGNOSIS — F1099 Alcohol use, unspecified with unspecified alcohol-induced disorder: Secondary | ICD-10-CM | POA: Diagnosis not present

## 2022-10-24 ENCOUNTER — Other Ambulatory Visit: Payer: Self-pay | Admitting: Nurse Practitioner

## 2022-10-24 DIAGNOSIS — F17200 Nicotine dependence, unspecified, uncomplicated: Secondary | ICD-10-CM

## 2022-10-25 ENCOUNTER — Other Ambulatory Visit: Payer: Self-pay | Admitting: Nurse Practitioner

## 2022-10-25 MED ORDER — VICTOZA 18 MG/3ML ~~LOC~~ SOPN: 1.8000 mg | PEN_INJECTOR | Freq: Every day | SUBCUTANEOUS | 0 refills | Status: DC

## 2022-10-28 ENCOUNTER — Other Ambulatory Visit: Payer: Self-pay | Admitting: Nurse Practitioner

## 2022-11-08 DIAGNOSIS — F331 Major depressive disorder, recurrent, moderate: Secondary | ICD-10-CM | POA: Diagnosis not present

## 2022-11-08 DIAGNOSIS — F1099 Alcohol use, unspecified with unspecified alcohol-induced disorder: Secondary | ICD-10-CM | POA: Diagnosis not present

## 2022-11-14 ENCOUNTER — Other Ambulatory Visit: Payer: Self-pay | Admitting: Nurse Practitioner

## 2022-11-14 DIAGNOSIS — K7030 Alcoholic cirrhosis of liver without ascites: Secondary | ICD-10-CM

## 2022-11-22 ENCOUNTER — Ambulatory Visit: Payer: Medicare HMO | Admitting: Gastroenterology

## 2022-11-25 ENCOUNTER — Ambulatory Visit (INDEPENDENT_AMBULATORY_CARE_PROVIDER_SITE_OTHER): Payer: Medicare HMO | Admitting: Nurse Practitioner

## 2022-11-25 ENCOUNTER — Encounter: Payer: Self-pay | Admitting: Nurse Practitioner

## 2022-11-25 VITALS — BP 122/73 | HR 114 | Ht 65.0 in | Wt 180.0 lb

## 2022-11-25 DIAGNOSIS — E119 Type 2 diabetes mellitus without complications: Secondary | ICD-10-CM | POA: Diagnosis not present

## 2022-11-25 DIAGNOSIS — E782 Mixed hyperlipidemia: Secondary | ICD-10-CM | POA: Diagnosis not present

## 2022-11-25 DIAGNOSIS — Z794 Long term (current) use of insulin: Secondary | ICD-10-CM

## 2022-11-25 DIAGNOSIS — I10 Essential (primary) hypertension: Secondary | ICD-10-CM | POA: Diagnosis not present

## 2022-11-25 DIAGNOSIS — E559 Vitamin D deficiency, unspecified: Secondary | ICD-10-CM

## 2022-11-25 LAB — POCT GLYCOSYLATED HEMOGLOBIN (HGB A1C): Hemoglobin A1C: 5.7 % — AB (ref 4.0–5.6)

## 2022-11-25 MED ORDER — DEXCOM G6 TRANSMITTER MISC: 1 refills | Status: DC

## 2022-11-25 MED ORDER — NOVOLIN 70/30 FLEXPEN (70-30) 100 UNIT/ML ~~LOC~~ SUPN: 5.0000 [IU] | PEN_INJECTOR | Freq: Two times a day (BID) | SUBCUTANEOUS | 3 refills | Status: DC

## 2022-11-25 MED ORDER — GABAPENTIN 300 MG PO CAPS: 300.0000 mg | ORAL_CAPSULE | Freq: Every day | ORAL | 1 refills | Status: DC | PRN

## 2022-11-25 MED ORDER — DEXCOM G6 SENSOR MISC: 1 refills | Status: DC

## 2022-11-25 MED ORDER — VICTOZA 18 MG/3ML ~~LOC~~ SOPN: 1.8000 mg | PEN_INJECTOR | Freq: Every day | SUBCUTANEOUS | 3 refills | Status: DC

## 2022-11-25 NOTE — Progress Notes (Signed)
Endocrinology Follow Up Note       11/25/2022, 11:30 AM   Subjective:    Patient ID: Michelle Michael, female    DOB: 09/16/1961.  Michelle Michael is being seen in follow up after being in seen in consultation for management of currently uncontrolled symptomatic diabetes requested by  Renee Rival, FNP.   Past Medical History:  Diagnosis Date   Anemia    Cirrhosis ([place])    CKD (chronic kidney disease)    Depression    Diabetes mellitus, type II ([place])    Hyperlipidemia    Hypertension    Hypokalemia    Hyponatremia     Past Surgical History:  Procedure Laterality Date   CHOLECYSTECTOMY     ORTHOPEDIC SURGERY      Social History   Socioeconomic History   Marital status: Divorced    Spouse name: Not on file   Number of children: 2   Years of education: 21   Highest education level: 12th grade  Occupational History   Not on file  Tobacco Use   Smoking status: Every Day    Packs/day: 1.00    Types: Cigarettes    Passive exposure: Current   Smokeless tobacco: Never   Tobacco comments:    Smoking Cessation Classes Offered.  Vaping Use   Vaping Use: Former  Substance and Sexual Activity   Alcohol use: Yes    Comment: "2 quartz of beer a day"   Drug use: No   Sexual activity: Not Currently    Partners: Male  Other Topics Concern   Not on file  Social History Narrative   Lives with her room mate    Social Determinants of Health   Financial Resource Strain: Low Risk  (10/07/2022)   Overall Financial Resource Strain (CARDIA)    Difficulty of Paying Living Expenses: Not hard at all  Food Insecurity: No Food Insecurity (10/07/2022)   Hunger Vital Sign    Worried About Running Out of Food in the Last Year: Never true    Ran Out of Food in the Last Year: Never true  Transportation Needs: No Transportation Needs (10/07/2022)   PRAPARE - Hydrologist (Medical): No     Lack of Transportation (Non-Medical): No  Physical Activity: Insufficiently Active (10/07/2022)   Exercise Vital Sign    Days of Exercise per Week: 3 days    Minutes of Exercise per Session: 30 min  Stress: No Stress Concern Present (10/07/2022)   [place]    Feeling of Stress : Only a little  Social Connections: Moderately Integrated (10/07/2022)   Social Connection and Isolation Panel [NHANES]    Frequency of Communication with Friends and Family: More than three times a week    Frequency of Social Gatherings with Friends and Family: More than three times a week    Attends Religious Services: More than 4 times per year    Active Member of [employer] or Organizations: Yes    Attends Music therapist: More than 4 times per year    Marital Status: Divorced    Family History  Problem Relation Age of  Onset   Diabetes Mother    Hypertension Mother    Hypertension Father    Breast cancer Neg Hx     Outpatient Encounter Medications as of 11/25/2022  Medication Sig   albuterol (VENTOLIN HFA) 108 (90 Base) MCG/ACT inhaler INHALE 2 PUFFS BY MOUTH EVERY 6 HOURS AS NEEDED FOR WHEEZING OR SHORTNESS OF BREATH   busPIRone (BUSPAR) 10 MG tablet One tab at 8 Am - 1 PM - 8 PM   Continuous Blood Gluc Receiver (DEXCOM G6 RECEIVER) DEVI by Does not apply route.   DULoxetine (CYMBALTA) 60 MG capsule Take 60 mg by mouth every morning.   fluticasone (FLONASE) 50 MCG/ACT nasal spray Place 2 sprays into both nostrils daily.   furosemide (LASIX) 40 MG tablet Take 40 mg by mouth daily.   gabapentin (NEURONTIN) 300 MG capsule Take 1 capsule (300 mg total) by mouth daily as needed (nerve pain).   gabapentin (NEURONTIN) 600 MG tablet Take 1 tablet (600 mg total) by mouth 2 (two) times daily.   Glucagon, rDNA, (GLUCAGON EMERGENCY) 1 MG KIT Inject 1 mg into the muscle as directed.   hydrOXYzine (ATARAX/VISTARIL) 10 MG tablet TAKE 1  Tablet BY MOUTH 3 TIMES DAILY AS NEEDED FOR ITCHING   Insulin Pen Needle (BD PEN NEEDLE NANO 2ND GEN) 32G X 4 MM MISC Use to inject insulin and Victoza for 3 injections per day   INSULIN SYRINGE .5CC/29G 29G X 1/2" 0.5 ML MISC Use to inject insulin twice daily.   Magnesium Oxide 400 MG CAPS Take 1 capsule (400 mg total) by mouth 2 (two) times daily.   metFORMIN (GLUCOPHAGE-XR) 500 MG 24 hr tablet Take 1 tablet (500 mg total) by mouth daily with breakfast.   Multiple Vitamin (MULTIVITAMIN WITH MINERALS) TABS tablet Take 1 tablet by mouth daily.   ondansetron (ZOFRAN) 4 MG tablet TAKE 1 TABLET BY MOUTH EVERY 8 HOURS AS NEEDED   pantoprazole (PROTONIX) 40 MG tablet Take 40 mg by mouth daily.   spironolactone (ALDACTONE) 100 MG tablet Take 100 mg by mouth daily.   traZODone (DESYREL) 50 MG tablet Take 1 tablet (50 mg total) by mouth at bedtime as needed for sleep.   Vitamin D, Ergocalciferol, (DRISDOL) 1.25 MG (50000 UNIT) CAPS capsule Take 1 capsule (50,000 Units total) by mouth every 7 (seven) days.   [DISCONTINUED] Continuous Blood Gluc Sensor (DEXCOM G6 SENSOR) MISC Apply 1 sensor every 10 days.   [DISCONTINUED] Continuous Blood Gluc Transmit (DEXCOM G6 TRANSMITTER) MISC Use transmitter every 90 days.   [DISCONTINUED] insulin isophane & regular human KwikPen (NOVOLIN 70/30 KWIKPEN) (70-30) 100 UNIT/ML KwikPen Inject 5-8 Units into the skin 2 (two) times daily with a meal. Pt taking 8-10 in the am and 5-8 at night   [DISCONTINUED] VICTOZA 18 MG/3ML SOPN INJECT 1.8 MG INTO THE SKIN DAILY   Continuous Blood Gluc Sensor (DEXCOM G6 SENSOR) MISC Apply 1 sensor every 10 days.   Continuous Blood Gluc Transmit (DEXCOM G6 TRANSMITTER) MISC Use transmitter every 90 days.   insulin isophane & regular human KwikPen (NOVOLIN 70/30 KWIKPEN) (70-30) 100 UNIT/ML KwikPen Inject 5-8 Units into the skin 2 (two) times daily with a meal. Pt taking 8-10 in the am and 5-8 at night   liraglutide (VICTOZA) 18 MG/3ML SOPN  Inject 1.8 mg into the skin daily.   No facility-administered encounter medications on file as of 11/25/2022.    ALLERGIES: No Known Allergies  VACCINATION STATUS:  There is no immunization history on file for this patient.  Diabetes She presents for her follow-up diabetic visit. She has type 2 diabetes mellitus. Onset time: Diagnosed at approx age of 61. Her disease course has been stable. There are no hypoglycemic associated symptoms. Associated symptoms include fatigue and foot paresthesias. There are no hypoglycemic complications. Symptoms are stable. Diabetic complications include nephropathy and peripheral neuropathy. Risk factors for coronary artery disease include diabetes mellitus, dyslipidemia, family history, hypertension, tobacco exposure and sedentary lifestyle. Current diabetic treatment includes oral agent (monotherapy) and insulin injections (and Victoza). She is compliant with treatment most of the time. Her weight is fluctuating minimally. She is following a generally healthy diet. When asked about meal planning, she reported none. She has not had a previous visit with a dietitian. She participates in exercise intermittently. Her home blood glucose trend is fluctuating minimally. Her overall blood glucose range is 140-180 mg/dl. (She presents today with her CGM showing at target glycemic profile overall.  Her POCT A1c today is 5.7% improving from last visit of 5.9%.  Analysis of her CGM shows TIR 83%, TAR 17%, TBR <1% with a GMI of 6.9%.  She denies significant hypoglycemia.) An ACE inhibitor/angiotensin II receptor blocker is not being taken. She does not see a podiatrist.Eye exam is current.  Hypertension This is a chronic problem. The current episode started more than 1 year ago. The problem has been resolved since onset. The problem is controlled. There are no associated agents to hypertension. Risk factors for coronary artery disease include diabetes mellitus, dyslipidemia,  smoking/tobacco exposure, family history and sedentary lifestyle. Past treatments include diuretics. The current treatment provides mild improvement. Compliance problems include diet, exercise and psychosocial issues.   Hyperlipidemia This is a chronic problem. The current episode started more than 1 year ago. The problem is uncontrolled. Recent lipid tests were reviewed and are high. Exacerbating diseases include diabetes. Factors aggravating her hyperlipidemia include smoking and fatty foods. She is currently on no antihyperlipidemic treatment. Compliance problems include adherence to diet, adherence to exercise and psychosocial issues.  Risk factors for coronary artery disease include diabetes mellitus, dyslipidemia, family history, hypertension and a sedentary lifestyle.     Review of systems  Constitutional: + Minimally fluctuating body weight, current Body mass index is 29.95 kg/m., + fatigue, no subjective hyperthermia, no subjective hypothermia Eyes: no blurry vision, no xerophthalmia ENT: no sore throat, no nodules palpated in throat, no dysphagia/odynophagia, no hoarseness Cardiovascular: no chest pain, no shortness of breath, no palpitations, no leg swelling Respiratory: chronic cough-smoker, no shortness of breath Gastrointestinal: no vomiting/diarrhea Musculoskeletal: no muscle/joint aches Skin: no rashes, no hyperemia Neurological: no tremors, + numbness/tingling to BLE- improved with recent med changes, no dizziness Psychiatric: hx depression- controlled on meds, no anxiety  Objective:     BP 122/73 (BP Location: Left Arm, Patient Position: Sitting, Cuff Size: Large)   Pulse (!) 114   Ht _0  (1.651 m)   Wt 180 lb (81.6 kg)   BMI 29.95 kg/m   Wt Readings from Last 3 Encounters:  11/25/22 180 lb (81.6 kg)  07/25/22 186 lb (84.4 kg)  07/15/22 186 lb (84.4 kg)     BP Readings from Last 3 Encounters:  11/25/22 122/73  07/25/22 116/73  07/15/22 119/76     Physical  Exam- Limited  Constitutional:  Body mass index is 29.95 kg/m. , not in acute distress Eyes:  EOMI, no exophthalmos Musculoskeletal: no gross deformities, strength intact in all four extremities, no gross restriction of joint movements Skin:  no rashes, no hyperemia, nicotinic  discoloration to fingernails Neurological: no tremor with outstretched hands    CMP ( most recent) CMP     Component Value Date/Time   NA 137 05/31/2022 0918   K 3.8 05/31/2022 0918   CL 99 05/31/2022 0918   CO2 30 05/31/2022 0918   GLUCOSE 130 (H) 05/31/2022 0918   BUN 6 05/31/2022 0918   CREATININE 0.64 05/31/2022 0918   CALCIUM 8.9 05/31/2022 0918   PROT 6.8 05/31/2022 0918   ALBUMIN 3.3 (L) 05/31/2022 0918   AST 91 (H) 05/31/2022 0918   ALT 30 05/31/2022 0918   ALKPHOS 272 (H) 05/31/2022 0918   BILITOT 0.8 05/31/2022 0918   GFRNONAA >60 05/31/2022 0918   GFRAA >60 06/20/2015 0420     Diabetic Labs (most recent): Lab Results  Component Value Date   HGBA1C 5.7 (A) 11/25/2022   HGBA1C 5.9 07/25/2022   HGBA1C 5.6 03/23/2022   MICROALBUR 2 05/21/2021     Lipid Panel ( most recent) Lipid Panel     Component Value Date/Time   CHOL 229 (H) 07/15/2022 1006   TRIG 109 07/15/2022 1006   HDL 74 07/15/2022 1006   CHOLHDL 3.1 07/15/2022 1006   LDLCALC 136 (H) 07/15/2022 1006   LABVLDL 19 07/15/2022 1006      Lab Results  Component Value Date   TSH 1.350 07/15/2022   TSH 1.036 06/22/2015           Assessment & Plan:   1) Type 2 diabetes mellitus without complication, with long-term current use of insulin ([place])  She presents today with her CGM showing at target glycemic profile overall.  Her POCT A1c today is 5.7% improving from last visit of 5.9%.  Analysis of her CGM shows TIR 83%, TAR 17%, TBR <1% with a GMI of 6.9%.  She denies significant hypoglycemia.  - Seynabou Fults has currently uncontrolled symptomatic type 2 DM since 61 years of age.  -Recent labs reviewed.  - I had a  long discussion with her about the progressive nature of diabetes and the pathology behind its complications. -her diabetes is complicated by smoking, peripheral neuropathy and she remains at a high risk for more acute and chronic complications which include CAD, CVA, CKD, retinopathy, and neuropathy. These are all discussed in detail with her.  - Nutritional counseling repeated at each appointment due to patients tendency to fall back in to old habits.  - The patient admits there is a room for improvement in their diet and drink choices. -  Suggestion is made for the patient to avoid simple carbohydrates from their diet including Cakes, Sweet Desserts / Pastries, Ice Cream, Soda (diet and regular), Sweet Tea, Candies, Chips, Cookies, Sweet Pastries, Store Bought Juices, Alcohol in Excess of 1-2 drinks a day, Artificial Sweeteners, Coffee Creamer, and "Sugar-free" Products. This will help patient to have stable blood glucose profile and potentially avoid unintended weight gain.   - I encouraged the patient to switch to unprocessed or minimally processed complex starch and increased protein intake (animal or plant source), fruits, and vegetables.   - Patient is advised to stick to a routine mealtimes to eat 3 meals a day and avoid unnecessary snacks (to snack only to correct hypoglycemia).  - she will be scheduled with Jearld Fenton, RDN, CDE for diabetes education.    - I have approached her with the following individualized plan to manage her diabetes and patient agrees:   -She is advised to continue her premixed insulin 70/30 to 8-10 units with  breakfast and 5-8 units with supper if glucose is above 90 and she is eating.   She can continue Metformin 500 mg ER daily and Victoza 1.8 mg SQ daily.   -she is encouraged to continue using her CGM to monitor her glucose 4 times daily, before meals and before bed, and to call the clinic if she has readings less than 70 or above 200 for 3 tests in a row.     - she is warned not to take insulin without proper monitoring per orders. - Adjustment parameters are given to her for hypo and hyperglycemia in writing.  - She is not an ideal candidate for incretin therapy due to her heavy smoking history increasing her risk of pancreatitis but since she has already been on Victoza for quite some time, will allow her to continue it.  We did discuss the s/s of pancreatitis to watch for.  - Specific targets for  A1c; LDL, HDL, and Triglycerides were discussed with the patient.  2) Blood Pressure /Hypertension:  her blood pressure is controlled to target today.   she is advised to continue her current medications including Lasix 40 mg po daily, Spironolactone 100 mg p.o. daily with breakfast.  3) Lipids/Hyperlipidemia:    Review of her recent lipid panel from 07/15/22 showed uncontrolled LDL at 136 . She is not currently on any lipid lowering medications.    4)  Weight/Diet:  her Body mass index is 29.95 kg/m.  -  clearly complicating her diabetes care.   she is a candidate for weight loss. I discussed with her the fact that loss of 5 - 10% of her  current body weight will have the most impact on her diabetes management.  Exercise, and detailed carbohydrates information provided  -  detailed on discharge instructions.  5) Chronic Care/Health Maintenance: -she is not on ACEI/ARB or Statin medications and is encouraged to initiate and continue to follow up with Ophthalmology, Dentist, Podiatrist at least yearly or according to recommendations, and advised to [place]. I have recommended yearly flu vaccine and pneumonia vaccine at least every 5 years; moderate intensity exercise for up to 150 minutes weekly; and sleep for at least 7 hours a day.  6) Vitamin D deficiency: Her recent vitamin D level was significantly low at 6 on 07/15/22.  Her PCP already started her on Ergocalciferol 50000 units weekly, I extended the treatment as she will most likely need it  for 6 months or so to boost her levels to normal.    - she is advised to maintain close follow up with [facility], Dewaine Conger, FNP for primary care needs, as well as her other providers for optimal and coordinated care.  I adjusted her Gabapentin to 600 mg po twice daily and sent in script for Gabapentin 300 mcg to take as needed for breakthrough pain at night.    I spent 38 minutes in the care of the patient today including review of labs from [facility], Lipids, Thyroid Function, Hematology (current and previous including abstractions from other facilities); face-to-face time discussing  her blood glucose readings/logs, discussing hypoglycemia and hyperglycemia episodes and symptoms, medications doses, her options of short and long term treatment based on the latest standards of care / guidelines;  discussion about incorporating lifestyle medicine;  and documenting the encounter. Risk reduction counseling performed per USPSTF guidelines to reduce obesity and cardiovascular risk factors.     Please refer to Patient Instructions for Blood Glucose Monitoring and Insulin/Medications Dosing Guide"  in media tab for additional information. Please  also refer to " Patient Self Inventory" in the Media  tab for reviewed elements of pertinent patient history.  [name] participated in the discussions, expressed understanding, and voiced agreement with the above plans.  All questions were answered to her satisfaction. she is encouraged to contact clinic should she have any questions or concerns prior to her return visit.     Follow up plan: - Return in about 6 months (around 05/27/2023) for Diabetes F/U with A1c in office, No previsit labs, Bring meter and logs.    Rayetta Pigg, [facility] [address] [place], [place] [address] Phone: [phone] Fax: [phone]  11/25/2022, 11:30 AM

## 2022-11-28 NOTE — Progress Notes (Unsigned)
GI Office Note    Referring Provider: Renee Rival, FNP Primary Care Physician:  Renee Rival, FNP Primary Gastroenterologist: Elon Alas. Abbey Chatters, DO  Date:  11/29/2022  ID:  Michelle Michael, DOB 10/03/1961, MRN [id number]   Chief Complaint   Chief Complaint  Patient presents with   Follow-up    History of Present Illness  Michelle Michael is a 61 y.o. female with a history of anemia, CAD, depression, type 2 diabetes, GERD, HTN, alcoholic cirrhosis presenting today for follow-up.  Colonoscopy February 2021: 2 polyps in the sigmoid colon, tubular adenomas.   EGD in February 2021: Nonbleeding cratered duodenal ulcer without stigmata of bleeding and second portion of duodenum, gastritis with moderate chronic inactive gastritis and intestinal metaplasia, negative H. 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  Prior cirrhosis workup revealed negative AMA, ANA, viral hepatitis.  ASMA slightly elevated 22, likely secondary to alcohol use.  aFP in November 2021 4.6  Last office visit 05/18/2022.  Drinks 1 bottle of wine twice per week, not always weekly.  Reported drinking likely related to stress and boredom.  Noted increased lower extremity edema, improved with elevation.  Reported weight gain since hospitalization.  Recently started on Victoza to help with better control blood sugars.  Has nausea secondary to medications, using Zofran.  Able to eat.  Denied bleeding episodes, pain.  Usually at least 1 bowel movement daily, sometimes 3-4.  GERD exacerbated by spicy foods.  On pantoprazole 40 mg daily.  Reinforced alcohol cessation, follow 2 g sodium diet, requested EGD and colonoscopy report.  Prior quadrant ultrasound ordered.  MELD labs ordered.  Follow-up in 6 months.  Labs 05/31/22: AFP 5.1, hemoglobin 15.1, MCV 110.5, platelets 175, sodium 137, creatinine 0.64, albumin 3.3, AST 91, ALT 30, alk phos 272, T. bili 0.8, INR  1.1  Today:  Cirrhosis history: Hematemesis/coffee ground emesis: No History of variceal bleeding:  No Abdominal pain: no, occasional nausea.  Abdominal distention/worsening ascites: None Fever/chills: No Episodes of confusion/disorientation: No Number of daily bowel movements: 2-3, sometimes loose.  Taking diuretics?:  Lasix 40 mg daily and spironolactone 100 mg daily Date of last EGD: February 2021 with very small varices, mild portal hypertensive gastropathy Prior history of banding?: None Prior episodes of SBP: None Last time liver imaging was performed: 05/31/2022 -normal hepatic Doppler, cirrhosis without hepatoma, s/p cholecystectomy.  MELD score: 10, labs from June 2023.   GERD - occasional nausea. Uses zofran sparingly. Likely related to meds on empty stomach. Denies jaundice. Has worsening of symptoms if eating too close to laying down. Denies dysphagia.   Abdominal pain has resolved. Ankles have been swelling a little bit recently. R>L. Not noticed in the mornings. Will elevate if able and will improve.   Denies chest pain or worsening shortness of breath. Uses her inhaler every morning.   Drank some on thanksgiving. Has had alcohol about 3-4 times since her last visit.   States she has been on Vitamin D supplement because her level was 6 in July. Endocrine and PCP have been monitoring this.    Current Outpatient Medications  Medication Sig Dispense Refill   albuterol (VENTOLIN HFA) 108 (90 Base) MCG/ACT inhaler INHALE 2 PUFFS BY MOUTH EVERY 6 HOURS AS NEEDED FOR WHEEZING OR SHORTNESS OF BREATH 9 g 0   busPIRone (BUSPAR) 10 MG tablet One tab at 8 Am - 1 PM - 8 PM     Continuous Blood Gluc Receiver ([place]) DEVI  by Does not apply route.     Continuous Blood Gluc Sensor (DEXCOM G6 SENSOR) MISC Apply 1 sensor every 10 days. 9 each 1   Continuous Blood Gluc Transmit (DEXCOM G6 TRANSMITTER) MISC Use transmitter every 90 days. 1 each 1   DULoxetine (CYMBALTA) 60  MG capsule Take 60 mg by mouth every morning.     fluticasone (FLONASE) 50 MCG/ACT nasal spray Place 2 sprays into both nostrils daily. 16 mL 2   furosemide (LASIX) 40 MG tablet Take 40 mg by mouth daily.     gabapentin (NEURONTIN) 300 MG capsule Take 1 capsule (300 mg total) by mouth daily as needed (nerve pain). 90 capsule 1   gabapentin (NEURONTIN) 600 MG tablet Take 1 tablet (600 mg total) by mouth 2 (two) times daily. 180 tablet 1   Glucagon, rDNA, (GLUCAGON EMERGENCY) 1 MG KIT Inject 1 mg into the muscle as directed.     hydrOXYzine (ATARAX/VISTARIL) 10 MG tablet TAKE 1 Tablet BY MOUTH 3 TIMES DAILY AS NEEDED FOR ITCHING     insulin isophane & regular human KwikPen (NOVOLIN 70/30 KWIKPEN) (70-30) 100 UNIT/ML KwikPen Inject 5-8 Units into the skin 2 (two) times daily with a meal. Pt taking 8-10 in the am and 5-8 at night 15 mL 3   Insulin Pen Needle (BD PEN NEEDLE NANO 2ND GEN) 32G X 4 MM MISC Use to inject insulin and Victoza for 3 injections per day 1000 each 6   INSULIN SYRINGE .5CC/29G 29G X 1/2" 0.5 ML MISC Use to inject insulin twice daily. 100 each 3   liraglutide (VICTOZA) 18 MG/3ML SOPN Inject 1.8 mg into the skin daily. 9 mL 3   metFORMIN (GLUCOPHAGE-XR) 500 MG 24 hr tablet Take 1 tablet (500 mg total) by mouth daily with breakfast. 90 tablet 3   ondansetron (ZOFRAN) 4 MG tablet TAKE 1 TABLET BY MOUTH EVERY 8 HOURS AS NEEDED 20 tablet 0   pantoprazole (PROTONIX) 40 MG tablet Take 40 mg by mouth daily.     spironolactone (ALDACTONE) 100 MG tablet Take 100 mg by mouth daily.     traZODone (DESYREL) 50 MG tablet Take 1 tablet (50 mg total) by mouth at bedtime as needed for sleep. 30 tablet 0   Vitamin D, Ergocalciferol, (DRISDOL) 1.25 MG (50000 UNIT) CAPS capsule Take 1 capsule (50,000 Units total) by mouth every 7 (seven) days. 12 capsule 1   Magnesium Oxide 400 MG CAPS Take 1 capsule (400 mg total) by mouth 2 (two) times daily. (Patient not taking: Reported on 11/29/2022) 4 capsule 0    Multiple Vitamin (MULTIVITAMIN WITH MINERALS) TABS tablet Take 1 tablet by mouth daily. (Patient not taking: Reported on 11/29/2022)     No current facility-administered medications for this visit.    Past Medical History:  Diagnosis Date   Anemia    Cirrhosis ([place])    CKD (chronic kidney disease)    Depression    Diabetes mellitus, type II ([place])    Hyperlipidemia    Hypertension    Hypokalemia    Hyponatremia     Past Surgical History:  Procedure Laterality Date   CHOLECYSTECTOMY     ORTHOPEDIC SURGERY      Family History  Problem Relation Age of Onset   Diabetes Mother    Hypertension Mother    Hypertension Father    Breast cancer Neg Hx     Allergies as of 11/29/2022   (No Known Allergies)    Social History   Socioeconomic History  Marital status: Divorced    Spouse name: Not on file   Number of children: 2   Years of education: 44   Highest education level: 12th grade  Occupational History   Not on file  Tobacco Use   Smoking status: Every Day    Packs/day: 1.00    Types: Cigarettes    Passive exposure: Current   Smokeless tobacco: Never   Tobacco comments:    Smoking Cessation Classes Offered.  Vaping Use   Vaping Use: Former  Substance and Sexual Activity   Alcohol use: Yes    Comment: "2 quartz of beer a day"   Drug use: No   Sexual activity: Not Currently    Partners: Male  Other Topics Concern   Not on file  Social History Narrative   Lives with her room mate    Social Determinants of Health   Financial Resource Strain: Low Risk  (10/07/2022)   Overall Financial Resource Strain (CARDIA)    Difficulty of Paying Living Expenses: Not hard at all  Food Insecurity: No Food Insecurity (10/07/2022)   Hunger Vital Sign    Worried About Running Out of Food in the Last Year: Never true    Ran Out of Food in the Last Year: Never true  Transportation Needs: No Transportation Needs (10/07/2022)   PRAPARE - Radiographer, therapeutic (Medical): No    Lack of Transportation (Non-Medical): No  Physical Activity: Insufficiently Active (10/07/2022)   Exercise Vital Sign    Days of Exercise per Week: 3 days    Minutes of Exercise per Session: 30 min  Stress: No Stress Concern Present (10/07/2022)   [place]    Feeling of Stress : Only a little  Social Connections: Moderately Integrated (10/07/2022)   Social Connection and Isolation Panel [NHANES]    Frequency of Communication with Friends and Family: More than three times a week    Frequency of Social Gatherings with Friends and Family: More than three times a week    Attends Religious Services: More than 4 times per year    Active Member of [employer] or Organizations: Yes    Attends Music therapist: More than 4 times per year    Marital Status: Divorced     Review of Systems   Gen: Denies fever, chills, anorexia. Denies fatigue, weakness, weight loss.  CV: Denies chest pain, palpitations, syncope, peripheral edema, and claudication. Resp: Denies dyspnea at rest, cough, wheezing, coughing up blood, and pleurisy. GI: See HPI Derm: Denies rash, itching, dry skin Psych: Denies depression, anxiety, memory loss, confusion. No homicidal or suicidal ideation.  Heme: Denies bruising, bleeding, and enlarged lymph nodes.   Physical Exam   BP 124/81 (BP Location: Left Arm, Patient Position: Sitting, Cuff Size: Normal)   Pulse (!) 110   Temp (!) 97.5 F (36.4 C) (Temporal)   Ht _0  (1.651 m)   Wt 180 lb 3.2 oz (81.7 kg)   SpO2 96%   BMI 29.99 kg/m   General:   Alert and oriented. No distress noted. Pleasant and cooperative.  Head:  Normocephalic and atraumatic. Eyes:  Conjuctiva clear without scleral icterus. Mouth:  Oral mucosa pink and moist. Good dentition. No lesions. Lungs:  Clear to auscultation bilaterally. Mild exp wheezing.  Heart:  S1, S2 present without murmurs  appreciated.  Abdomen:  +BS, soft, non-tender and non-distended. No rebound or guarding.  Mild hepatomegaly noted.  No masses  noted. Rectal: deferred Msk:  Symmetrical without gross deformities. Normal posture. Extremities:  mild non pitting edema to right ankle. Neurologic:  Alert and  oriented x4 Psych:  Alert and cooperative. Normal mood and affect.   Assessment  Michelle Michael is a 61 y.o. female with a history of anemia, CAD, depression, type 2 diabetes, GERD, HTN, alcoholic cirrhosis presenting today for follow-up.  Alcoholic cirrhosis: Still having alcohol on occasion, last drink was on Thanksgiving.  Since her last visit she has had 3-4 occurrences of drinking.  Denies any recent melena, BRBPR, or hematemesis.  Currently denies any abdominal pain, ascites, pruritus, mental status change, or jaundice.  Having about 2-3 bowel movements daily.  Still having occasional nausea controlled with Zofran.  Has been having a little bit of increased swelling in her right ankle but usually improves with elevation.  Likely not adhering to a 2 grams sodium diet.  Last MELD score 10 based on labs in June.  Last ultrasound in June without hepatoma.  Last EGD in February 2021 with small varices, mild portal hypertensive gastropathy. Advised dietary adherence of 2 g sodium diet to reduce any peripheral swelling.  Will keep current diuretic regimen at Lasix 40 mg daily and spironolactone 100 mg daily.  Currently without any worsening shortness of breath or increased abdominal girth.  Will plan to repeat labs and ultrasound with follow-up in 6 months.  Will check yearly AFP at that time.  Plan for EGD in February 2024 Dr. Abbey Chatters.  I have discussed the risks, alternatives, benefits with regards to but not limited to the risk of reaction to medication, bleeding, infection, perforation and the patient is agreeable to proceed. Written consent to be obtained.  GERD: Fairly well-controlled on pantoprazole 40 mg daily,  will have breakthrough symptoms if she eats too close to laying down for bed.  Also with occasional nausea that usually occurs when taking medications on empty stomach in the mornings.  Uses Zofran sparingly.  Denies any dysphagia.  PLAN   Recheck AFP in 6 months CBC, CMP, PT/INR today Right upper quadrant ultrasound Due for EGD in February 2024 with Dr. Abbey Chatters, ASA 3.  I have discussed the risks, alternatives, benefits with regards to but not limited to the risk of reaction to medication, bleeding, infection, perforation and the patient is agreeable to proceed. Written consent to be obtained.  Continue pantoprazole 40 mg daily.  GERD diet/lifestyle modifications Alcohol cessation Continue lasix and spironolactone at current doses. High-protein diet from a primarily plant-based diet. Avoid red meat.  No raw or undercooked meat, seafood, or shellfish. Low-fat/cholesterol/carbohydrate diet. Limit sodium to no more than 2000 mg/day including everything that you eat and drink. Recommend at least 30 minutes of aerobic and resistance exercise 3 days/week. Limit sodium to 2000 mg per day. Likely due for repeat TCS in 2026, will discuss with Dr. Abbey Chatters. Follow up in 6 months.    Venetia Night, MSN, FNP-BC, AGACNP-BC [facility]

## 2022-11-29 ENCOUNTER — Encounter: Payer: Self-pay | Admitting: Gastroenterology

## 2022-11-29 ENCOUNTER — Ambulatory Visit (INDEPENDENT_AMBULATORY_CARE_PROVIDER_SITE_OTHER): Payer: Medicare HMO | Admitting: Gastroenterology

## 2022-11-29 VITALS — BP 124/81 | HR 110 | Temp 97.5°F | Ht 65.0 in | Wt 180.2 lb

## 2022-11-29 DIAGNOSIS — K219 Gastro-esophageal reflux disease without esophagitis: Secondary | ICD-10-CM | POA: Diagnosis not present

## 2022-11-29 DIAGNOSIS — K7030 Alcoholic cirrhosis of liver without ascites: Secondary | ICD-10-CM

## 2022-11-29 NOTE — Patient Instructions (Signed)
Nutrition:  High-protein diet from a primarily plant-based diet. Avoid red meat.  No raw or undercooked meat, seafood, or shellfish. Low-fat/cholesterol/carbohydrate diet. Limit sodium to no more than 2000 mg/day including everything that you eat and drink. Recommend at least 30 minutes of aerobic and resistance exercise 3 days/week. Avoid alcohol.  Follow a GERD diet:  Avoid fried, fatty, greasy, spicy, citrus foods. Avoid caffeine and carbonated beverages. Avoid chocolate. Try eating 4-6 small meals a day rather than 3 large meals. Do not eat within 3 hours of laying down. Prop head of bed up on wood or bricks to create a 6 inch incline.  Continue pantoprazole 40 mg daily.  Continue Lasix and spironolactone at current doses.  We are scheduling for right upper quadrant ultrasound.  I am ordering labs for you have completed when you had this done at [facility].  We will plan to complete your upper endoscopy in February 2024.  Our schedulers will contact you when the schedule is available.  We have a wonderful Christmas and happy new year.  We will see you in 6 months.  It was a pleasure to see you today. I want to create trusting relationships with patients. If you receive a survey regarding your visit,  I greatly appreciate you taking time to fill this out on paper or through your MyChart. I value your feedback.  Brooke Bonito, MSN, FNP-BC, AGACNP-BC [facility]

## 2022-11-30 ENCOUNTER — Ambulatory Visit (HOSPITAL_BASED_OUTPATIENT_CLINIC_OR_DEPARTMENT_OTHER): Payer: Medicare HMO | Admitting: Internal Medicine

## 2022-12-06 ENCOUNTER — Telehealth: Payer: Self-pay | Admitting: *Deleted

## 2022-12-06 DIAGNOSIS — E119 Type 2 diabetes mellitus without complications: Secondary | ICD-10-CM | POA: Diagnosis not present

## 2022-12-06 NOTE — Telephone Encounter (Signed)
[place] scheduled at [facility]  for 12/16/22 at 8:30 am, arrive at 8:15 am, nothing to eat or drink after midnight.  LMTRC

## 2022-12-15 ENCOUNTER — Ambulatory Visit (INDEPENDENT_AMBULATORY_CARE_PROVIDER_SITE_OTHER): Payer: Medicare HMO | Admitting: Internal Medicine

## 2022-12-15 ENCOUNTER — Ambulatory Visit: Payer: Medicare HMO | Admitting: Nurse Practitioner

## 2022-12-15 ENCOUNTER — Encounter: Payer: Self-pay | Admitting: Internal Medicine

## 2022-12-15 VITALS — BP 104/70 | HR 107 | Ht 65.0 in | Wt 177.4 lb

## 2022-12-15 DIAGNOSIS — E1169 Type 2 diabetes mellitus with other specified complication: Secondary | ICD-10-CM | POA: Diagnosis not present

## 2022-12-15 DIAGNOSIS — E559 Vitamin D deficiency, unspecified: Secondary | ICD-10-CM | POA: Diagnosis not present

## 2022-12-15 DIAGNOSIS — I10 Essential (primary) hypertension: Secondary | ICD-10-CM

## 2022-12-15 DIAGNOSIS — E119 Type 2 diabetes mellitus without complications: Secondary | ICD-10-CM

## 2022-12-15 DIAGNOSIS — H9193 Unspecified hearing loss, bilateral: Secondary | ICD-10-CM | POA: Diagnosis not present

## 2022-12-15 DIAGNOSIS — K7030 Alcoholic cirrhosis of liver without ascites: Secondary | ICD-10-CM | POA: Diagnosis not present

## 2022-12-15 DIAGNOSIS — E785 Hyperlipidemia, unspecified: Secondary | ICD-10-CM

## 2022-12-15 DIAGNOSIS — Z2821 Immunization not carried out because of patient refusal: Secondary | ICD-10-CM

## 2022-12-15 DIAGNOSIS — H9190 Unspecified hearing loss, unspecified ear: Secondary | ICD-10-CM | POA: Insufficient documentation

## 2022-12-15 DIAGNOSIS — F17200 Nicotine dependence, unspecified, uncomplicated: Secondary | ICD-10-CM

## 2022-12-15 DIAGNOSIS — Z794 Long term (current) use of insulin: Secondary | ICD-10-CM

## 2022-12-15 DIAGNOSIS — R5383 Other fatigue: Secondary | ICD-10-CM | POA: Diagnosis not present

## 2022-12-15 MED ORDER — ALBUTEROL SULFATE HFA 108 (90 BASE) MCG/ACT IN AERS: INHALATION_SPRAY | RESPIRATORY_TRACT | 1 refills | Status: DC

## 2022-12-15 MED ORDER — ONDANSETRON HCL 4 MG PO TABS: 4.0000 mg | ORAL_TABLET | Freq: Three times a day (TID) | ORAL | 2 refills | Status: DC | PRN

## 2022-12-15 NOTE — Assessment & Plan Note (Signed)
Followed by GI.  Prescribed Lasix/Aldactone.  Appears compensated today.  She continues to drink alcohol. -RUQ U/S scheduled for tomorrow (12/22) -Further management per gastroenterology

## 2022-12-15 NOTE — Assessment & Plan Note (Addendum)
Followed by endocrinology.  Most recent hemoglobin A1c was 5.7.  She is currently prescribed Humulin 70/30 5-8 units twice daily with meals, Victoza 1.8 mg daily, and Glucophage XR 500 mg daily. -Endocrine follow-up scheduled for June -Urine microalbumin/creatinine ratio ordered today -Recently referred to ophthalmology for routine diabetic eye care.  We will follow-up on the status of this referral

## 2022-12-15 NOTE — Assessment & Plan Note (Signed)
Not currently on statin therapy due to history of cirrhosis.  She was previously referred to the lipid clinic for Repatha, but has not scheduled an appointment as she is not able to drive to [place].  Her most recent LDL was 136. -Repeat lipid panel ordered today -We will work on starting [employer]

## 2022-12-15 NOTE — Progress Notes (Signed)
Established Patient Office Visit  Subjective   Patient ID: Jobana Gruszka, female    DOB: 10/13/1961  Age: 61 y.o. MRN: [id number]  Chief Complaint  Patient presents with   Diabetes    Follow up   Ms. Trammell returns to care today.  She was last seen at [facility] on 07/15/22 by Vena Rua, NP for her annual exam.  In the interim she has been seen by endocrinology and gastroenterology for follow-up.  There have otherwise been no acute interval events.  Today Ms. Reinking endorses a 2-week history of fatigue.  She is otherwise asymptomatic.  She requests a referral to audiology for a hearing assessment as she is concerned about bilateral hearing loss, most prominent in her left ear.  Past Medical History:  Diagnosis Date   Anemia    Cirrhosis ([place])    CKD (chronic kidney disease)    Depression    Diabetes mellitus, type II ([place])    Hyperlipidemia    Hypertension    Hypokalemia    Hyponatremia    Past Surgical History:  Procedure Laterality Date   CHOLECYSTECTOMY     ORTHOPEDIC SURGERY     Social History   Tobacco Use   Smoking status: Every Day    Packs/day: 1.00    Types: Cigarettes    Passive exposure: Current   Smokeless tobacco: Never   Tobacco comments:    Smoking Cessation Classes Offered.  Vaping Use   Vaping Use: Former  Substance Use Topics   Alcohol use: Yes    Comment: "2 quartz of beer a day"   Drug use: No   Family History  Problem Relation Age of Onset   Diabetes Mother    Hypertension Mother    Hypertension Father    Breast cancer Neg Hx    No Known Allergies    Review of Systems  Constitutional:  Positive for malaise/fatigue.  HENT:  Positive for hearing loss (Bilateral (L > R)).   All other systems reviewed and are negative.     Objective:     BP 104/70   Pulse (!) 107   Ht 5\' 5"  (1.651 m)   Wt 177 lb 6.4 oz (80.5 kg)   SpO2 95%   BMI 29.52 kg/m  BP Readings from Last 3 Encounters:  12/15/22 104/70  11/29/22 124/81  11/25/22  122/73      Physical Exam Vitals reviewed.  Constitutional:      General: She is not in acute distress.    Appearance: Normal appearance. She is obese. She is not toxic-appearing.  HENT:     Head: Normocephalic and atraumatic.     Right Ear: Tympanic membrane, ear canal and external ear normal.     Left Ear: Tympanic membrane, ear canal and external ear normal.     Ears:     Comments: Diminished hearing in left ear    Nose: Nose normal. No congestion or rhinorrhea.     Mouth/Throat:     Mouth: Mucous membranes are moist.     Pharynx: Oropharynx is clear. No oropharyngeal exudate or posterior oropharyngeal erythema.  Eyes:     General: No scleral icterus.    Extraocular Movements: Extraocular movements intact.     Conjunctiva/sclera: Conjunctivae normal.     Pupils: Pupils are equal, round, and reactive to light.  Cardiovascular:     Rate and Rhythm: Normal rate and regular rhythm.     Pulses: Normal pulses.     Heart sounds: Normal heart sounds.  No murmur heard.    No friction rub. No gallop.  Pulmonary:     Effort: Pulmonary effort is normal.     Breath sounds: Normal breath sounds. No wheezing, rhonchi or rales.  Abdominal:     General: Abdomen is flat. Bowel sounds are normal. There is no distension.     Palpations: Abdomen is soft.     Tenderness: There is no abdominal tenderness.  Musculoskeletal:        General: No swelling. Normal range of motion.     Cervical back: Normal range of motion.     Right lower leg: No edema.     Left lower leg: No edema.  Lymphadenopathy:     Cervical: No cervical adenopathy.  Skin:    General: Skin is warm and dry.     Capillary Refill: Capillary refill takes less than 2 seconds.     Coloration: Skin is not jaundiced.  Neurological:     General: No focal deficit present.     Mental Status: She is alert and oriented to person, place, and time.     Gait: Gait abnormal.  Psychiatric:        Mood and Affect: Mood normal.         Behavior: Behavior normal.    Last CBC Lab Results  Component Value Date   WBC 6.3 05/31/2022   HGB 15.1 (H) 05/31/2022   HCT 45.1 05/31/2022   MCV 110.5 (H) 05/31/2022   MCH 37.0 (H) 05/31/2022   RDW 13.5 05/31/2022   PLT 175 05/31/2022   Last metabolic panel Lab Results  Component Value Date   GLUCOSE 130 (H) 05/31/2022   NA 137 05/31/2022   K 3.8 05/31/2022   CL 99 05/31/2022   CO2 30 05/31/2022   BUN 6 05/31/2022   CREATININE 0.64 05/31/2022   GFRNONAA >60 05/31/2022   CALCIUM 8.9 05/31/2022   PROT 6.8 05/31/2022   ALBUMIN 3.3 (L) 05/31/2022   BILITOT 0.8 05/31/2022   ALKPHOS 272 (H) 05/31/2022   AST 91 (H) 05/31/2022   ALT 30 05/31/2022   ANIONGAP 8 05/31/2022   Last lipids Lab Results  Component Value Date   CHOL 229 (H) 07/15/2022   HDL 74 07/15/2022   LDLCALC 136 (H) 07/15/2022   TRIG 109 07/15/2022   CHOLHDL 3.1 07/15/2022   Last hemoglobin A1c Lab Results  Component Value Date   HGBA1C 5.7 (A) 11/25/2022   Last thyroid functions Lab Results  Component Value Date   TSH 1.350 07/15/2022   Last vitamin D Lab Results  Component Value Date   VD25OH 6.0 (L) 07/15/2022   The 10-year ASCVD risk score (Arnett DK, et al., 2019) is: 11.5%    Assessment & Plan:   Problem List Items Addressed This Visit       Hypertension    Blood pressure today is 104/70.  She is currently prescribed spironolactone 100 mg daily and Lasix 40 mg daily for management of cirrhosis.  Ideally would like to have her on ACE/ARB in the setting of diabetes, however her blood pressure today is prohibitive.   -No medication changes today.      Cirrhosis ([facility])    Followed by GI.  Prescribed Lasix/Aldactone.  Appears compensated today.  She continues to drink alcohol. -RUQ U/S scheduled for tomorrow (12/22) -Further management per gastroenterology      Diabetes mellitus, type II ([facility])    Followed by endocrinology.  Most recent hemoglobin A1c was 5.7.  She is currently  prescribed Humulin 70/30 5-8 units twice daily with meals, Victoza 1.8 mg daily, and Glucophage XR 500 mg daily. -Endocrine follow-up scheduled for June -Urine microalbumin/creatinine ratio ordered today -Recently referred to ophthalmology for routine diabetic eye care.  We will follow-up on the status of this referral      Hearing impaired    Audiology referral placed today for hearing assessment      Dyslipidemia, goal LDL below 70    Not currently on statin therapy due to history of cirrhosis.  She was previously referred to the lipid clinic for [place], but has not scheduled an appointment as she is not able to drive to [place].  Her most recent LDL was 136. -Repeat lipid panel ordered today -We will work on starting Repatha      Fatigue - Primary    Today she endorses a 2-week history of fatigue.  There have been no medication changes or acute events in the last 2 weeks.  Etiology unclear. -Repeat labs ordered today to assess for underlying metabolic etiologies      Return in about 3 months (around 03/16/2023).    Johnette Abraham, MD

## 2022-12-15 NOTE — Assessment & Plan Note (Signed)
Blood pressure today is 104/70.  She is currently prescribed spironolactone 100 mg daily and Lasix 40 mg daily for management of cirrhosis.  Ideally would like to have her on ACE/ARB in the setting of diabetes, however her blood pressure today is prohibitive.   -No medication changes today.

## 2022-12-15 NOTE — Assessment & Plan Note (Signed)
Today she endorses a 2-week history of fatigue.  There have been no medication changes or acute events in the last 2 weeks.  Etiology unclear. -Repeat labs ordered today to assess for underlying metabolic etiologies

## 2022-12-15 NOTE — Patient Instructions (Signed)
It was a pleasure to see you today.  Thank you for giving [place] the opportunity to be involved in your care.  Below is a brief recap of your visit and next steps.  We will plan to see you again in 3 months.  Summary No medication changes today I have placed a referral to audiology We will work on getting Repatha for your cholesterol Follow up on status of ophthalmology referral  Labs have been ordered today to assess for underlying causes of fatigue Plan for follow up in 3 months

## 2022-12-15 NOTE — Assessment & Plan Note (Signed)
Audiology referral placed today for hearing assessment

## 2022-12-16 ENCOUNTER — Other Ambulatory Visit (HOSPITAL_COMMUNITY)
Admission: RE | Admit: 2022-12-16 | Discharge: 2022-12-16 | Disposition: A | Discharge status: Home / Self Care | Payer: Medicare HMO | Source: Ambulatory Visit | Attending: Gastroenterology | Admitting: Gastroenterology

## 2022-12-16 ENCOUNTER — Ambulatory Visit (HOSPITAL_COMMUNITY)
Admission: RE | Admit: 2022-12-16 | Discharge: 2022-12-16 | Disposition: A | Discharge status: Home / Self Care | Payer: Medicare HMO | Source: Ambulatory Visit | Attending: Gastroenterology | Admitting: Gastroenterology

## 2022-12-16 DIAGNOSIS — R5383 Other fatigue: Secondary | ICD-10-CM | POA: Diagnosis not present

## 2022-12-16 DIAGNOSIS — E559 Vitamin D deficiency, unspecified: Secondary | ICD-10-CM | POA: Diagnosis not present

## 2022-12-16 DIAGNOSIS — Z9049 Acquired absence of other specified parts of digestive tract: Secondary | ICD-10-CM | POA: Diagnosis not present

## 2022-12-16 DIAGNOSIS — E119 Type 2 diabetes mellitus without complications: Secondary | ICD-10-CM | POA: Insufficient documentation

## 2022-12-16 DIAGNOSIS — K7030 Alcoholic cirrhosis of liver without ascites: Secondary | ICD-10-CM | POA: Insufficient documentation

## 2022-12-16 DIAGNOSIS — K7460 Unspecified cirrhosis of liver: Secondary | ICD-10-CM | POA: Diagnosis not present

## 2022-12-16 DIAGNOSIS — E785 Hyperlipidemia, unspecified: Secondary | ICD-10-CM | POA: Diagnosis not present

## 2022-12-16 LAB — COMPREHENSIVE METABOLIC PANEL
ALT: 26 U/L (ref 0–44)
AST: 85 U/L — ABNORMAL HIGH (ref 15–41)
Albumin: 3.2 g/dL — ABNORMAL LOW (ref 3.5–5.0)
Alkaline Phosphatase: 278 U/L — ABNORMAL HIGH (ref 38–126)
Anion gap: 11 (ref 5–15)
BUN: 7 mg/dL — ABNORMAL LOW (ref 8–23)
CO2: 27 mmol/L (ref 22–32)
Calcium: 8.5 mg/dL — ABNORMAL LOW (ref 8.9–10.3)
Chloride: 96 mmol/L — ABNORMAL LOW (ref 98–111)
Creatinine, Ser: 0.61 mg/dL (ref 0.44–1.00)
GFR, Estimated: >60 mL/min (ref >60)
Glucose, Bld: 89 mg/dL (ref 70–99)
Potassium: 3 mmol/L — ABNORMAL LOW (ref 3.5–5.1)
Sodium: 134 mmol/L — ABNORMAL LOW (ref 135–145)
Total Bilirubin: 2.5 mg/dL — ABNORMAL HIGH (ref 0.3–1.2)
Total Protein: 7.1 g/dL (ref 6.5–8.1)

## 2022-12-16 LAB — IRON AND TIBC
Iron: 125 ug/dL (ref 28–170)
Saturation Ratios: 45 % — ABNORMAL HIGH (ref 10.4–31.8)
TIBC: 280 ug/dL (ref 250–450)
UIBC: 155 ug/dL

## 2022-12-16 LAB — CBC
HCT: 42.4 % (ref 36.0–46.0)
Hemoglobin: 14.5 g/dL (ref 12.0–15.0)
MCH: 36.9 pg — ABNORMAL HIGH (ref 26.0–34.0)
MCHC: 34.2 g/dL (ref 30.0–36.0)
MCV: 107.9 fL — ABNORMAL HIGH (ref 80.0–100.0)
Platelets: 197 10*3/uL (ref 150–400)
RBC: 3.93 MIL/uL (ref 3.87–5.11)
RDW: 15 % (ref 11.5–15.5)
WBC: 8.4 10*3/uL (ref 4.0–10.5)
nRBC: 0 % (ref 0.0–0.2)

## 2022-12-16 LAB — VITAMIN B12: Vitamin B-12: 581 pg/mL (ref 180–914)

## 2022-12-16 LAB — TSH: TSH: 1.972 u[IU]/mL (ref 0.350–4.500)

## 2022-12-16 LAB — PROTIME-INR
INR: 1.2 (ref 0.8–1.2)
Prothrombin Time: 15.3 seconds — ABNORMAL HIGH (ref 11.4–15.2)

## 2022-12-16 LAB — FERRITIN: Ferritin: 350 ng/mL — ABNORMAL HIGH (ref 11–307)

## 2022-12-16 LAB — LIPID PANEL
Cholesterol: 204 mg/dL — ABNORMAL HIGH (ref 0–200)
HDL: 57 mg/dL (ref >40)
LDL Cholesterol: 128 mg/dL — ABNORMAL HIGH (ref 0–99)
Total CHOL/HDL Ratio: 3.6 RATIO
Triglycerides: 97 mg/dL (ref <150)
VLDL: 19 mg/dL (ref 0–40)

## 2022-12-16 LAB — PROTEIN / CREATININE RATIO, URINE
Creatinine, Urine: 249.82 mg/dL
Protein Creatinine Ratio: 0.11 mg/mg{Cre} (ref 0.00–0.15)
Total Protein, Urine: 27 mg/dL

## 2022-12-16 LAB — FOLATE: Folate: 2.6 ng/mL — ABNORMAL LOW (ref >5.9)

## 2022-12-16 LAB — T4, FREE: Free T4: 1.37 ng/dL — ABNORMAL HIGH (ref 0.61–1.12)

## 2022-12-16 LAB — VITAMIN D 25 HYDROXY (VIT D DEFICIENCY, FRACTURES): Vit D, 25-Hydroxy: 101.67 ng/mL — ABNORMAL HIGH (ref 30–100)

## 2022-12-21 ENCOUNTER — Other Ambulatory Visit (HOSPITAL_COMMUNITY)
Admission: RE | Admit: 2022-12-21 | Discharge: 2022-12-21 | Disposition: A | Discharge status: Home / Self Care | Payer: Medicare HMO | Source: Ambulatory Visit | Attending: Internal Medicine | Admitting: Internal Medicine

## 2022-12-21 DIAGNOSIS — K7030 Alcoholic cirrhosis of liver without ascites: Secondary | ICD-10-CM | POA: Diagnosis not present

## 2022-12-21 DIAGNOSIS — R5383 Other fatigue: Secondary | ICD-10-CM | POA: Diagnosis not present

## 2022-12-22 LAB — MICROALBUMIN / CREATININE URINE RATIO
Creatinine, Urine: 8 mg/dL
Microalb, Ur: <3 ug/mL — ABNORMAL HIGH

## 2022-12-23 ENCOUNTER — Telehealth: Payer: Self-pay

## 2022-12-23 ENCOUNTER — Other Ambulatory Visit: Payer: Self-pay

## 2023-01-04 ENCOUNTER — Ambulatory Visit: Payer: Medicare HMO | Admitting: Audiologist

## 2023-01-12 ENCOUNTER — Telehealth (INDEPENDENT_AMBULATORY_CARE_PROVIDER_SITE_OTHER): Payer: Self-pay | Admitting: *Deleted

## 2023-01-12 NOTE — Telephone Encounter (Signed)
LMOVM to call back to schedule EGD with Dr. Abbey Chatters ASA 3 in Feb 2024

## 2023-01-16 ENCOUNTER — Encounter: Payer: Self-pay | Admitting: *Deleted

## 2023-01-16 NOTE — Telephone Encounter (Signed)
Pt has been scheduled for 02/10/23 with Dr.Carver. Instructions mailed to pt

## 2023-01-16 NOTE — Telephone Encounter (Signed)
Verified with provider the medications that pt should hold prior to procedure.  Hold Victoza and Metformin day before and morning of procedure. Instructions printed and mailed.

## 2023-01-17 ENCOUNTER — Encounter: Payer: Self-pay | Admitting: Internal Medicine

## 2023-01-17 ENCOUNTER — Ambulatory Visit (INDEPENDENT_AMBULATORY_CARE_PROVIDER_SITE_OTHER): Payer: Medicare HMO | Admitting: Internal Medicine

## 2023-01-17 VITALS — BP 115/72 | HR 102 | Resp 16 | Ht 65.0 in | Wt 177.4 lb

## 2023-01-17 DIAGNOSIS — Z0000 Encounter for general adult medical examination without abnormal findings: Secondary | ICD-10-CM | POA: Diagnosis not present

## 2023-01-17 NOTE — Patient Instructions (Signed)
  Michelle Michael , Thank you for taking time to come for your Medicare Wellness Visit. I appreciate your ongoing commitment to your health goals. Please review the following plan we discussed and let me know if I can assist you in the future.   These are the goals we discussed: Patient would like to increase activity by walking when weather warms up.      This is a list of the screening recommended for you and due dates:  Health Maintenance  Topic Date Due   Medicare Annual Wellness Visit  Never done   Eye exam for diabetics  Never done   Zoster (Shingles) Vaccine (1 of 2) Never done   Flu Shot  03/26/2023*   Pap Smear  07/16/2023*   Hemoglobin A1C  05/27/2023   Complete foot exam   07/16/2023   Yearly kidney function blood test for diabetes  12/17/2023   Yearly kidney health urinalysis for diabetes  12/22/2023   Mammogram  06/24/2024   Colon Cancer Screening  04/06/2030   DTaP/Tdap/Td vaccine (2 - Td or Tdap) 11/13/2030   Hepatitis C Screening: USPSTF Recommendation to screen - Ages 80-79 yo.  Completed   HPV Vaccine  Aged Out   COVID-19 Vaccine  Discontinued   HIV Screening  Discontinued  *Topic was postponed. The date shown is not the original due date.

## 2023-01-17 NOTE — Progress Notes (Signed)
Subjective:   Michelle Michael is a 62 y.o. female who presents for Medicare Annual (Subsequent) preventive examination.  Review of Systems    Review of Systems  All other systems reviewed and are negative.   Objective:    Today's Vitals   01/17/23 1012 01/17/23 1022  BP: 115/72   Pulse: (!) 102   Resp: 16   SpO2: 96%   Weight: 177 lb 6.4 oz (80.5 kg)   Height: 5\' 5"  (1.651 m)   PainSc:  4    Body mass index is 29.52 kg/m.     10/07/2022    1:06 PM 06/21/2015   12:00 PM 06/17/2015    2:28 AM  Advanced Directives  Does Patient Have a Medical Advance Directive? No  No  Would patient like information on creating a medical advance directive? No - Patient declined  No - patient declined information     Information is confidential and restricted. Go to Review Flowsheets to unlock data.    Current Medications (verified) Outpatient Encounter Medications as of 01/17/2023  Medication Sig   albuterol (VENTOLIN HFA) 108 (90 Base) MCG/ACT inhaler INHALE 2 PUFFS BY MOUTH EVERY 6 HOURS AS NEEDED FOR WHEEZING OR SHORTNESS OF BREATH   busPIRone (BUSPAR) 10 MG tablet One tab at 8 Am - 1 PM - 8 PM   Continuous Blood Gluc Receiver (DEXCOM G6 RECEIVER) DEVI by Does not apply route.   Continuous Blood Gluc Sensor (DEXCOM G6 SENSOR) MISC Apply 1 sensor every 10 days.   Continuous Blood Gluc Transmit (DEXCOM G6 TRANSMITTER) MISC Use transmitter every 90 days.   DULoxetine (CYMBALTA) 60 MG capsule Take 60 mg by mouth every morning.   fluticasone (FLONASE) 50 MCG/ACT nasal spray Place 2 sprays into both nostrils daily.   furosemide (LASIX) 40 MG tablet Take 40 mg by mouth daily.   gabapentin (NEURONTIN) 300 MG capsule Take 1 capsule (300 mg total) by mouth daily as needed (nerve pain).   gabapentin (NEURONTIN) 600 MG tablet Take 1 tablet (600 mg total) by mouth 2 (two) times daily.   Glucagon, rDNA, (GLUCAGON EMERGENCY) 1 MG KIT Inject 1 mg into the muscle as directed.   hydrOXYzine  (ATARAX/VISTARIL) 10 MG tablet TAKE 1 Tablet BY MOUTH 3 TIMES DAILY AS NEEDED FOR ITCHING   insulin isophane & regular human KwikPen (NOVOLIN 70/30 KWIKPEN) (70-30) 100 UNIT/ML KwikPen Inject 5-8 Units into the skin 2 (two) times daily with a meal. Pt taking 8-10 in the am and 5-8 at night   Insulin Pen Needle (BD PEN NEEDLE NANO 2ND GEN) 32G X 4 MM MISC Use to inject insulin and Victoza for 3 injections per day   INSULIN SYRINGE .5CC/29G 29G X 1/2" 0.5 ML MISC Use to inject insulin twice daily.   liraglutide (VICTOZA) 18 MG/3ML SOPN Inject 1.8 mg into the skin daily.   metFORMIN (GLUCOPHAGE-XR) 500 MG 24 hr tablet Take 1 tablet (500 mg total) by mouth daily with breakfast.   ondansetron (ZOFRAN) 4 MG tablet Take 1 tablet (4 mg total) by mouth every 8 (eight) hours as needed.   pantoprazole (PROTONIX) 40 MG tablet Take 40 mg by mouth daily.   spironolactone (ALDACTONE) 100 MG tablet Take 100 mg by mouth daily.   traZODone (DESYREL) 50 MG tablet Take 1 tablet (50 mg total) by mouth at bedtime as needed for sleep.   Vitamin D, Ergocalciferol, (DRISDOL) 1.25 MG (50000 UNIT) CAPS capsule Take 1 capsule (50,000 Units total) by mouth every 7 (seven) days.  No facility-administered encounter medications on file as of 01/17/2023.    Allergies (verified) Patient has no known allergies.   History: Past Medical History:  Diagnosis Date   Anemia    Anxiety    Cirrhosis ([place])    CKD (chronic kidney disease)    Depression    Diabetes mellitus, type II (HCC)    GERD (gastroesophageal reflux disease)    Hyperlipidemia    Hypertension    Hypokalemia    Hyponatremia    Neuromuscular disorder ([place]) 2021   Substance abuse ([place])    Years ago   Ulcer 1980s   Past Surgical History:  Procedure Laterality Date   CESAREAN SECTION  1984   CHOLECYSTECTOMY     FRACTURE SURGERY  2005   [place] SURGERY  2003   TUBAL LIGATION  1993   Family History  Problem Relation Age of Onset    Diabetes Mother    Hypertension Mother    Alcohol abuse Mother    Cancer Mother    Hypertension Father    Arthritis Father    Heart disease Father    Cancer Daughter    Obesity Daughter    Drug abuse Son    Breast cancer Neg Hx    Social History   Socioeconomic History   Marital status: Divorced    Spouse name: Not on file   Number of children: 2   Years of education: 12   Highest education level: 12th grade  Occupational History   Not on file  Tobacco Use   Smoking status: Every Day    Packs/day: 1.00    Years: 40.00    Total pack years: 40.00    Types: Cigarettes    Passive exposure: Current   Smokeless tobacco: Never   Tobacco comments:    Smoking Cessation Classes Offered.  Vaping Use   Vaping Use: Former  Substance and Sexual Activity   Alcohol use: Yes    Alcohol/week: 10.0 standard drinks of alcohol    Types: 10 Glasses of wine per week    Comment: "2 quartz of beer a day"   Drug use: Never   Sexual activity: Not Currently    Partners: Male    Birth control/protection: Abstinence, Post-menopausal, None  Other Topics Concern   Not on file  Social History Narrative   Lives with her room mate    Social Determinants of Health   Financial Resource Strain: Low Risk  (01/16/2023)   Overall Financial Resource Strain (CARDIA)    Difficulty of Paying Living Expenses: Not very hard  Food Insecurity: No Food Insecurity (01/16/2023)   Hunger Vital Sign    Worried About Running Out of Food in the Last Year: Never true    Ran Out of Food in the Last Year: Never true  Transportation Needs: Unmet Transportation Needs (01/16/2023)   PRAPARE - Hydrologist (Medical): Yes    Lack of Transportation (Non-Medical): No  Physical Activity: Inactive (01/16/2023)   Exercise Vital Sign    Days of Exercise per Week: 0 days    Minutes of Exercise per Session: 0 min  Stress: Stress Concern Present (01/16/2023)   [place]    Feeling of Stress : Rather much  Social Connections: Socially Isolated (01/16/2023)   Social Connection and Isolation Panel [NHANES]    Frequency of Communication with Friends and Family: Once a week    Frequency  of Social Gatherings with Friends and Family: Once a week    Attends Religious Services: More than 4 times per year    Active Member of [employer] or Organizations: No    Attends Banker Meetings: Never    Marital Status: Divorced    Tobacco Counseling Ready to quit: No Counseling given: Not Answered Tobacco comments: Smoking Cessation Classes Offered.   Clinical Intake:  Pre-visit preparation completed: Yes  Pain : 0-10 Pain Score: 4  Pain Location: Back Pain Onset: More than a month ago     How often do you need to have someone help you when you read instructions, pamphlets, or other written materials from your doctor or pharmacy?: 1 - Never What is the last grade level you completed in school?: associattes degree  Diabetic?yes  Activities of Daily Living    01/16/2023   11:00 AM 10/07/2022    1:05 PM  In your present state of health, do you have any difficulty performing the following activities:  Hearing? 1 0  Vision? 1 0  Difficulty concentrating or making decisions? 0 0  Walking or climbing stairs? 1 1  Comment  Unsteady Balance/Gait  Dressing or bathing? 0 0  Doing errands, shopping? 1 0  Preparing Food and eating ? N N  Using the Toilet? N N  In the past six months, have you accidently leaked urine? Y N  Do you have problems with loss of bowel control? Y N  Managing your Medications? N N  Managing your Finances? N N  Housekeeping or managing your Housekeeping? Y N    Patient Care Team: Billie Lade, MD as PCP - General (Internal Medicine) Dani Gobble, NP as Referring Physician (Nurse Practitioner)  Indicate any recent Medical Services you may have received from other than [facility]  providers in the past year (date may be approximate).     Assessment:   This is a routine wellness examination for [place].  Hearing/Vision screen No results found.  Dietary issues and exercise activities discussed:     Goals Addressed   None    Depression Screen    01/17/2023   10:24 AM 12/15/2022    9:17 AM 10/07/2022    1:02 PM 07/15/2022    9:08 AM 04/14/2022   10:32 AM 01/24/2022   10:54 AM  PHQ 2/9 Scores  PHQ - 2 Score 2 0 0 0 0 3  PHQ- 9 Score      10    Fall Risk    01/17/2023   10:27 AM 01/17/2023   10:13 AM 01/16/2023   11:00 AM 12/15/2022    9:16 AM 10/07/2022    1:05 PM  Fall Risk   Falls in the past year? 1 1 1 1 1   Number falls in past yr: 1 1 1  0 1  Injury with Fall? 0 1 0 0 0  Risk for fall due to :    No Fall Risks History of fall(s);Impaired balance/gait;Impaired mobility;Impaired vision  Follow up    Falls evaluation completed Falls evaluation completed;Education provided;Falls prevention discussed    FALL RISK PREVENTION PERTAINING TO THE HOME:  Any stairs in or around the home? Yes  If so, are there any without handrails? Yes  Home free of loose throw rugs in walkways, pet beds, electrical cords, etc? Yes  Adequate lighting in your home to reduce risk of falls? Yes   ASSISTIVE DEVICES UTILIZED TO PREVENT FALLS:  Life alert? No  Use of a cane,  walker or w/c? Yes  Grab bars in the bathroom? No  Shower chair or bench in shower? Yes  Elevated toilet seat or a handicapped toilet? No    Cognitive Function:        01/17/2023   10:28 AM  6CIT Screen  What month? 0 points  What time? 0 points  Count back from 20 0 points  Months in reverse 0 points  Repeat phrase 0 points    Immunizations Immunization History  Administered Date(s) Administered   Hepatitis A, Adult 11/13/2020   Tdap 11/13/2020    TDAP status: Up to date  Flu Vaccine status: Declined, Education has been provided regarding the importance of this vaccine but patient  still declined. Advised may receive this vaccine at local pharmacy or Health Dept. Aware to provide a copy of the vaccination record if obtained from local pharmacy or Health Dept. Verbalized acceptance and understanding.  Pneumococcal vaccine status: Declined,  Education has been provided regarding the importance of this vaccine but patient still declined. Advised may receive this vaccine at local pharmacy or Health Dept. Aware to provide a copy of the vaccination record if obtained from local pharmacy or Health Dept. Verbalized acceptance and understanding.   Covid-19 vaccine status: Declined, Education has been provided regarding the importance of this vaccine but patient still declined. Advised may receive this vaccine at local pharmacy or Health Dept.or vaccine clinic. Aware to provide a copy of the vaccination record if obtained from local pharmacy or Health Dept. Verbalized acceptance and understanding.  Qualifies for Shingles Vaccine? Yes   Zostavax completed No   Shingrix Completed?: No.    Education has been provided regarding the importance of this vaccine. Patient has been advised to call insurance company to determine out of pocket expense if they have not yet received this vaccine. Advised may also receive vaccine at local pharmacy or Health Dept. Verbalized acceptance and understanding.  Screening Tests Health Maintenance  Topic Date Due   Medicare Annual Wellness (AWV)  Never done   OPHTHALMOLOGY EXAM  Never done   Zoster Vaccines- Shingrix (1 of 2) Never done   INFLUENZA VACCINE  03/26/2023 (Originally 07/26/2022)   PAP SMEAR-Modifier  07/16/2023 (Originally 06/04/1982)   HEMOGLOBIN A1C  05/27/2023   FOOT EXAM  07/16/2023   Diabetic kidney evaluation - eGFR measurement  12/17/2023   Diabetic kidney evaluation - Urine ACR  12/22/2023   MAMMOGRAM  06/24/2024   COLONOSCOPY (Pts 45-29yrs Insurance coverage will need to be confirmed)  04/06/2030   DTaP/Tdap/Td (2 - Td or Tdap)  11/13/2030   Hepatitis C Screening  Completed   HPV VACCINES  Aged Out   COVID-19 Vaccine  Discontinued   HIV Screening  Discontinued    Health Maintenance  Health Maintenance Due  Topic Date Due   Medicare Annual Wellness (AWV)  Never done   OPHTHALMOLOGY EXAM  Never done   Zoster Vaccines- Shingrix (1 of 2) Never done    Colorectal cancer screening: Type of screening: Colonoscopy. Completed 04/06/2020. Repeat every 10 years  Mammogram status: Completed 06/24/2022. Repeat every year  Lung Cancer Screening: (Low Dose CT Chest recommended if Age 58-80 years, 30 pack-year currently smoking OR have quit w/in 15years.) does qualify.   Lung Cancer Screening Referral: Placed today,01/17/2023  Additional Screening:  Hepatitis C Screening: does not qualify; Completed 06/17/2015  Vision Screening: Recommended annual ophthalmology exams for early detection of glaucoma and other disorders of the eye. Is the patient up to date with their annual  eye exam?  Yes  Who is the provider or what is the name of the office in which the patient attends annual eye exams? [facility] If pt is not established with a provider, would they like to be referred to a provider to establish care? No .   Dental Screening: Recommended annual dental exams for proper oral hygiene  Community Resource Referral / Chronic Care Management: CRR required this visit?  No   CCM required this visit?  No      Plan:     I have personally reviewed and noted the following in the patient's chart:   Medical and social history Use of alcohol, tobacco or illicit drugs  Current medications and supplements including opioid prescriptions. Patient is not currently taking opioid prescriptions. Functional ability and status Nutritional status Physical activity Advanced directives List of other physicians Hospitalizations, surgeries, and ER visits in previous 12 months Vitals Screenings to include cognitive, depression, and  falls Referrals and appointments  In addition, I have reviewed and discussed with patient certain preventive protocols, quality metrics, and best practice recommendations. A written personalized care plan for preventive services as well as general preventive health recommendations were provided to patient.     Milus Banister, MD   01/17/2023

## 2023-02-03 ENCOUNTER — Other Ambulatory Visit: Payer: Self-pay | Admitting: Internal Medicine

## 2023-02-03 DIAGNOSIS — F17200 Nicotine dependence, unspecified, uncomplicated: Secondary | ICD-10-CM

## 2023-02-07 NOTE — Patient Instructions (Signed)
20    Your procedure is scheduled on: 02/10/2023  Report to [place] Entrance at    6:00 AM.  Call this number if you have problems the morning of surgery: [phone]   Remember:   Follow instructions on letter from office regarding when to stop eating and drinking        No Smoking the day of procedure      Take these medicines the morning of surgery with A SIP OF WATER: Buspar, Cymbalta, Gabapentin, and pantoprazole  Hold victoza the day before procedure as instructed in letter from office  No diabetic medication am of procedure   Do not wear jewelry, make-up or nail polish.  Do not wear lotions, powders, or perfumes. You may wear deodorant.                Do not bring valuables to the hospital.  Contacts, dentures or bridgework may not be worn into surgery.  Leave suitcase in the car. After surgery it may be brought to your room.  For patients admitted to the hospital, checkout time is 11:00 AM the day of discharge.   Patients discharged the day of surgery will not be allowed to drive home. Upper Endoscopy, Adult Upper endoscopy is a procedure to look inside the upper GI (gastrointestinal) tract. The upper GI tract is made up of: The part of the body that moves food from your mouth to your stomach (esophagus). The stomach. The first part of your small intestine (duodenum). This procedure is also called esophagogastroduodenoscopy (EGD) or gastroscopy. In this procedure, your health care provider passes a thin, flexible tube (endoscope) through your mouth and down your esophagus into your stomach. A small camera is attached to the end of the tube. Images from the camera appear on a monitor in the exam room. During this procedure, your health care provider may also remove a small piece of tissue to be sent to a lab and examined under a microscope (biopsy). Your health care provider may do an upper endoscopy to diagnose cancers of the upper GI tract. You may also have this  procedure to find the cause of other conditions, such as: Stomach pain. Heartburn. Pain or problems when swallowing. Nausea and vomiting. Stomach bleeding. Stomach ulcers. Tell a health care provider about: Any allergies you have. All medicines you are taking, including vitamins, herbs, eye drops, creams, and over-the-counter medicines. Any problems you or family members have had with anesthetic medicines. Any blood disorders you have. Any surgeries you have had. Any medical conditions you have. Whether you are pregnant or may be pregnant. What are the risks? Generally, this is a safe procedure. However, problems may occur, including: Infection. Bleeding. Allergic reactions to medicines. A tear or hole (perforation) in the esophagus, stomach, or duodenum. What happens before the procedure? Staying hydrated Follow instructions from your health care provider about hydration, which may include: Up to 4 hours before the procedure - you may continue to drink clear liquids, such as water, clear fruit juice, black coffee, and plain tea.   Medicines Ask your health care provider about: Changing or stopping your regular medicines. This is especially important if you are taking diabetes medicines or blood thinners. Taking medicines such as aspirin and ibuprofen. These medicines can thin your blood. Do not take these medicines unless your health care provider tells you to take them. Taking over-the-counter medicines, vitamins, herbs, and supplements. General instructions Plan to have someone take you home from the hospital or  clinic. If you will be going home right after the procedure, plan to have someone with you for 24 hours. Ask your health care provider what steps will be taken to help prevent infection. What happens during the procedure?  An IV will be inserted into one of your veins. You may be given one or more of the following: A medicine to help you relax (sedative). A  medicine to numb the throat (local anesthetic). You will lie on your left side on an exam table. Your health care provider will pass the endoscope through your mouth and down your esophagus. Your health care provider will use the scope to check the inside of your esophagus, stomach, and duodenum. Biopsies may be taken. The endoscope will be removed. The procedure may vary among health care providers and hospitals. What happens after the procedure? Your blood pressure, heart rate, breathing rate, and blood oxygen level will be monitored until you leave the hospital or clinic. Do not drive for 24 hours if you were given a sedative during your procedure. When your throat is no longer numb, you may be given some fluids to drink. It is up to you to get the results of your procedure. Ask your health care provider, or the department that is doing the procedure, when your results will be ready. Summary Upper endoscopy is a procedure to look inside the upper GI tract. During the procedure, an IV will be inserted into one of your veins. You may be given a medicine to help you relax. A medicine will be used to numb your throat. The endoscope will be passed through your mouth and down your esophagus. This information is not intended to replace advice given to you by your health care provider. Make sure you discuss any questions you have with your health care provider. Document Revised: 06/06/2018 Document Reviewed: 05/14/2018 Elsevier Patient Education  [place] After  Please read the instructions outlined below and refer to this sheet in the next few weeks. These discharge instructions provide you with general information on caring for yourself after you leave the hospital. Your doctor may also give you specific instructions. While your treatment  has been planned according to the most current medical practices available, unavoidable complications occasionally occur. If you have any problems or questions after discharge, please call your doctor. HOME CARE INSTRUCTIONS Activity You may resume your regular activity but move at a slower pace for the next 24 hours.  Take frequent rest periods for the next 24 hours.  Walking will help expel (get rid of) the air and reduce the bloated feeling in your abdomen.  No driving for 24 hours (because of the anesthesia (medicine) used during the test).  You  may shower.  Do not sign any important legal documents or operate any machinery for 24 hours (because of the anesthesia used during the test).  Nutrition Drink plenty of fluids.  You may resume your normal diet.  Begin with a light meal and progress to your normal diet.  Avoid alcoholic beverages for 24 hours or as instructed by your caregiver.  Medications You may resume your normal medications unless your caregiver tells you otherwise. What you can expect today You may experience abdominal discomfort such as a feeling of fullness or "gas" pains.  You may experience a sore throat for 2 to 3 days. This is normal. Gargling with salt water may help this.  Follow-up Your doctor will discuss the results of your test with you. SEEK IMMEDIATE MEDICAL CARE IF: You have excessive nausea (feeling sick to your stomach) and/or vomiting.  You have severe abdominal pain and distention (swelling).  You have trouble swallowing.  You have a temperature over 100 F (37.8 C).  You have rectal bleeding or vomiting of blood.  Document Released: 07/26/2004 Document Revised: 12/01/2011 Document Reviewed: 02/06/2008

## 2023-02-08 ENCOUNTER — Other Ambulatory Visit: Payer: Self-pay | Admitting: Gastroenterology

## 2023-02-08 ENCOUNTER — Encounter (HOSPITAL_COMMUNITY)
Admission: RE | Admit: 2023-02-08 | Discharge: 2023-02-08 | Disposition: A | Discharge status: Home / Self Care | Payer: Medicare HMO | Source: Ambulatory Visit | Attending: Internal Medicine | Admitting: Internal Medicine

## 2023-02-08 ENCOUNTER — Other Ambulatory Visit: Payer: Self-pay

## 2023-02-08 ENCOUNTER — Encounter (HOSPITAL_COMMUNITY): Payer: Self-pay

## 2023-02-08 VITALS — BP 116/57 | Temp 97.6°F | Resp 18 | Ht 65.0 in | Wt 177.5 lb

## 2023-02-08 DIAGNOSIS — I10 Essential (primary) hypertension: Secondary | ICD-10-CM | POA: Diagnosis not present

## 2023-02-08 DIAGNOSIS — Z01818 Encounter for other preprocedural examination: Secondary | ICD-10-CM | POA: Insufficient documentation

## 2023-02-08 DIAGNOSIS — K7030 Alcoholic cirrhosis of liver without ascites: Secondary | ICD-10-CM | POA: Insufficient documentation

## 2023-02-08 DIAGNOSIS — E876 Hypokalemia: Secondary | ICD-10-CM

## 2023-02-08 LAB — COMPREHENSIVE METABOLIC PANEL
ALT: 26 U/L (ref 0–44)
AST: 108 U/L — ABNORMAL HIGH (ref 15–41)
Albumin: 2.6 g/dL — ABNORMAL LOW (ref 3.5–5.0)
Alkaline Phosphatase: 330 U/L — ABNORMAL HIGH (ref 38–126)
Anion gap: 18 — ABNORMAL HIGH (ref 5–15)
BUN: 7 mg/dL — ABNORMAL LOW (ref 8–23)
CO2: 23 mmol/L (ref 22–32)
Calcium: 8.1 mg/dL — ABNORMAL LOW (ref 8.9–10.3)
Chloride: 90 mmol/L — ABNORMAL LOW (ref 98–111)
Creatinine, Ser: 0.67 mg/dL (ref 0.44–1.00)
GFR, Estimated: >60 mL/min (ref >60)
Glucose, Bld: 164 mg/dL — ABNORMAL HIGH (ref 70–99)
Potassium: 2.6 mmol/L — CL (ref 3.5–5.1)
Sodium: 131 mmol/L — ABNORMAL LOW (ref 135–145)
Total Bilirubin: 4.9 mg/dL — ABNORMAL HIGH (ref 0.3–1.2)
Total Protein: 6.7 g/dL (ref 6.5–8.1)

## 2023-02-08 MED ORDER — POTASSIUM CHLORIDE CRYS ER 20 MEQ PO TBCR: 40.0000 meq | EXTENDED_RELEASE_TABLET | Freq: Every day | ORAL | 0 refills | Status: DC

## 2023-02-08 NOTE — Pre-Procedure Instructions (Signed)
Potassium of 2.6 messaged to Mindy And to Dr Gala Romney.

## 2023-02-10 ENCOUNTER — Encounter (HOSPITAL_COMMUNITY): Payer: Self-pay

## 2023-02-10 ENCOUNTER — Ambulatory Visit (HOSPITAL_BASED_OUTPATIENT_CLINIC_OR_DEPARTMENT_OTHER): Payer: Medicare HMO | Admitting: Anesthesiology

## 2023-02-10 ENCOUNTER — Ambulatory Visit (HOSPITAL_COMMUNITY): Payer: Medicare HMO | Admitting: Anesthesiology

## 2023-02-10 ENCOUNTER — Ambulatory Visit (HOSPITAL_COMMUNITY)
Admission: RE | Admit: 2023-02-10 | Discharge: 2023-02-10 | Disposition: A | Discharge status: Home / Self Care | Payer: Medicare HMO | Attending: Internal Medicine | Admitting: Internal Medicine

## 2023-02-10 ENCOUNTER — Encounter (HOSPITAL_COMMUNITY)
Admission: RE | Disposition: A | Discharge status: Home / Self Care | Payer: Self-pay | Source: Home / Self Care | Attending: Internal Medicine

## 2023-02-10 DIAGNOSIS — B3781 Candidal esophagitis: Secondary | ICD-10-CM

## 2023-02-10 DIAGNOSIS — Z7984 Long term (current) use of oral hypoglycemic drugs: Secondary | ICD-10-CM | POA: Diagnosis not present

## 2023-02-10 DIAGNOSIS — I129 Hypertensive chronic kidney disease with stage 1 through stage 4 chronic kidney disease, or unspecified chronic kidney disease: Secondary | ICD-10-CM | POA: Insufficient documentation

## 2023-02-10 DIAGNOSIS — K7460 Unspecified cirrhosis of liver: Secondary | ICD-10-CM | POA: Insufficient documentation

## 2023-02-10 DIAGNOSIS — F419 Anxiety disorder, unspecified: Secondary | ICD-10-CM | POA: Insufficient documentation

## 2023-02-10 DIAGNOSIS — N189 Chronic kidney disease, unspecified: Secondary | ICD-10-CM | POA: Insufficient documentation

## 2023-02-10 DIAGNOSIS — K2970 Gastritis, unspecified, without bleeding: Secondary | ICD-10-CM

## 2023-02-10 DIAGNOSIS — F32A Depression, unspecified: Secondary | ICD-10-CM | POA: Insufficient documentation

## 2023-02-10 DIAGNOSIS — K2100 Gastro-esophageal reflux disease with esophagitis, without bleeding: Secondary | ICD-10-CM

## 2023-02-10 DIAGNOSIS — Z794 Long term (current) use of insulin: Secondary | ICD-10-CM | POA: Diagnosis not present

## 2023-02-10 DIAGNOSIS — E119 Type 2 diabetes mellitus without complications: Secondary | ICD-10-CM | POA: Diagnosis not present

## 2023-02-10 DIAGNOSIS — Z7985 Long-term (current) use of injectable non-insulin antidiabetic drugs: Secondary | ICD-10-CM | POA: Diagnosis not present

## 2023-02-10 DIAGNOSIS — F17210 Nicotine dependence, cigarettes, uncomplicated: Secondary | ICD-10-CM | POA: Diagnosis not present

## 2023-02-10 DIAGNOSIS — R12 Heartburn: Secondary | ICD-10-CM | POA: Insufficient documentation

## 2023-02-10 DIAGNOSIS — E1122 Type 2 diabetes mellitus with diabetic chronic kidney disease: Secondary | ICD-10-CM | POA: Insufficient documentation

## 2023-02-10 DIAGNOSIS — K319 Disease of stomach and duodenum, unspecified: Secondary | ICD-10-CM | POA: Diagnosis not present

## 2023-02-10 HISTORY — PX: ESOPHAGEAL BRUSHING: SHX6842

## 2023-02-10 HISTORY — PX: BIOPSY: SHX5522

## 2023-02-10 HISTORY — PX: ESOPHAGOGASTRODUODENOSCOPY (EGD) WITH PROPOFOL: SHX5813

## 2023-02-10 LAB — KOH PREP: KOH Prep: NONE SEEN

## 2023-02-10 LAB — GLUCOSE, CAPILLARY: Glucose-Capillary: 187 mg/dL — ABNORMAL HIGH (ref 70–99)

## 2023-02-10 SURGERY — ESOPHAGOGASTRODUODENOSCOPY (EGD) WITH PROPOFOL
Anesthesia: General

## 2023-02-10 MED ORDER — SIMETHICONE 40 MG/0.6ML PO SUSP
ORAL | Status: AC
  Filled 2023-02-10: qty 0.6

## 2023-02-10 MED ORDER — LACTATED RINGERS IV SOLN: INTRAVENOUS | Status: DC | PRN

## 2023-02-10 MED ORDER — PROPOFOL 10 MG/ML IV BOLUS
INTRAVENOUS | Status: DC | PRN
  Administered 2023-02-10: 50 mg via INTRAVENOUS
  Administered 2023-02-10: 100 mg via INTRAVENOUS
  Administered 2023-02-10: 50 mg via INTRAVENOUS
  Administered 2023-02-10: 20 mg via INTRAVENOUS

## 2023-02-10 MED ORDER — PANTOPRAZOLE SODIUM 40 MG PO TBEC: 40.0000 mg | DELAYED_RELEASE_TABLET | Freq: Two times a day (BID) | ORAL | 11 refills | Status: DC

## 2023-02-10 MED ORDER — LIDOCAINE HCL (CARDIAC) PF 100 MG/5ML IV SOSY
PREFILLED_SYRINGE | INTRAVENOUS | Status: DC | PRN
  Administered 2023-02-10: 50 mg via INTRATRACHEAL

## 2023-02-10 NOTE — Discharge Instructions (Addendum)
EGD Discharge instructions Please read the instructions outlined below and refer to this sheet in the next few weeks. These discharge instructions provide you with general information on caring for yourself after you leave the hospital. Your doctor may also give you specific instructions. While your treatment has been planned according to the most current medical practices available, unavoidable complications occasionally occur. If you have any problems or questions after discharge, please call your doctor. ACTIVITY You may resume your regular activity but move at a slower pace for the next 24 hours.  Take frequent rest periods for the next 24 hours.  Walking will help expel (get rid of) the air and reduce the bloated feeling in your abdomen.  No driving for 24 hours (because of the anesthesia (medicine) used during the test).  You may shower.  Do not sign any important legal documents or operate any machinery for 24 hours (because of the anesthesia used during the test).  NUTRITION Drink plenty of fluids.  You may resume your normal diet.  Begin with a light meal and progress to your normal diet.  Avoid alcoholic beverages for 24 hours or as instructed by your caregiver.  MEDICATIONS You may resume your normal medications unless your caregiver tells you otherwise.  WHAT YOU CAN EXPECT TODAY You may experience abdominal discomfort such as a feeling of fullness or "gas" pains.  FOLLOW-UP Your doctor will discuss the results of your test with you.  SEEK IMMEDIATE MEDICAL ATTENTION IF ANY OF THE FOLLOWING OCCUR: Excessive nausea (feeling sick to your stomach) and/or vomiting.  Severe abdominal pain and distention (swelling).  Trouble swallowing.  Temperature over 101 F (37.8 C).  Rectal bleeding or vomiting of blood.    I did not see any evidence of varices throughout your entire esophagus.  You do have evidence of acid reflux esophagitis.  You also had what appeared to be a yeast  infection in your esophagus.  I took samples of this.  If positive we will treat with Diflucan.  You also have significant gastritis throughout your entire stomach as well.  I took samples of this to rule infection a bacteria called H. pylori.  Small bowel appeared normal.  I am going to increase your pantoprazole to 40 mg twice daily.  Avoid all NSAIDs.  Follow-up with GI in 3 months.  I hope you have a great rest of your week!  Elon Alas. Abbey Chatters, D.O. Gastroenterology and Hepatology [facility]

## 2023-02-10 NOTE — H&P (Signed)
Primary Care Physician:  Johnette Abraham, MD Primary Gastroenterologist:  Dr. Abbey Chatters  Pre-Procedure History & Physical: HPI:  Michelle Michael is a 62 y.o. female is here for an EGD to be performed for cirrhosis/variceal screening and chronic GERD  Past Medical History:  Diagnosis Date   Anemia    Anxiety    Cirrhosis ([place])    CKD (chronic kidney disease)    Depression    Diabetes mellitus, type II ([place])    GERD (gastroesophageal reflux disease)    Hyperlipidemia    Hypertension    Hypokalemia    Hyponatremia    Neuromuscular disorder ([place]) 2021   Substance abuse ([place])    Years ago   Ulcer 1980s    Past Surgical History:  Procedure Laterality Date   CESAREAN SECTION  1984   CHOLECYSTECTOMY     FRACTURE SURGERY  2005   [place]  2003   [place]    Prior to Admission medications   Medication Sig Start Date End Date Taking? Authorizing Provider  albuterol (VENTOLIN HFA) 108 (90 Base) MCG/ACT inhaler INHALE 2 PUFFS BY MOUTH EVERY 6 HOURS AS NEEDED FOR WHEEZING FOR SHORTNESS OF BREATH 02/03/23  Yes Johnette Abraham, MD  busPIRone (BUSPAR) 10 MG tablet Take 10 mg by mouth 3 (three) times daily. 05/31/21  Yes [provider]  cholecalciferol (VITAMIN D3) 25 MCG (1000 UNIT) tablet Take 1,000 Units by mouth daily.   Yes [provider]  Continuous Blood Gluc Receiver ([place]) [place] by Does not apply route. 04/16/20  Yes [provider]  Continuous Blood Gluc Sensor (DEXCOM G6 SENSOR) MISC Apply 1 sensor every 10 days. 11/25/22  Yes Brita Romp, NP  Continuous Blood Gluc Transmit (DEXCOM G6 TRANSMITTER) MISC Use transmitter every 90 days. 11/25/22  Yes Reardon, Juanetta Beets, NP  DULoxetine (CYMBALTA) 30 MG capsule Take 30 mg by mouth every morning. 08/26/21  Yes [provider]  folic acid (FOLVITE) A999333 MCG tablet Take 400 mcg by mouth daily.   Yes [provider]  furosemide (LASIX) 40 MG  tablet Take 40 mg by mouth daily. 10/26/21  Yes [provider]  gabapentin (NEURONTIN) 600 MG tablet Take 1 tablet (600 mg total) by mouth 2 (two) times daily. 09/22/22  Yes Brita Romp, NP  hydrOXYzine (ATARAX/VISTARIL) 10 MG tablet Take 10 mg by mouth daily as needed for anxiety. 08/02/21  Yes [provider]  insulin isophane & regular human KwikPen (NOVOLIN 70/30 KWIKPEN) (70-30) 100 UNIT/ML KwikPen Inject 5-8 Units into the skin 2 (two) times daily with a meal. Pt taking 8-10 in the am and 5-8 at night Patient taking differently: Inject 5-8 Units into the skin 2 (two) times daily with a meal. 11/25/22  Yes Reardon, Loree Fee J, NP  Insulin Pen Needle (BD PEN NEEDLE NANO 2ND GEN) 32G X 4 MM MISC Use to inject insulin and Victoza for 3 injections per day 07/25/22  Yes Reardon, Juanetta Beets, NP  INSULIN SYRINGE .5CC/29G 29G X 1/2" 0.5 ML MISC Use to inject insulin twice daily. 11/04/21  Yes Reardon, Juanetta Beets, NP  liraglutide (VICTOZA) 18 MG/3ML SOPN Inject 1.8 mg into the skin daily. 11/25/22  Yes Reardon, Juanetta Beets, NP  metFORMIN (GLUCOPHAGE-XR) 500 MG 24 hr tablet Take 1 tablet (500 mg total) by mouth daily with breakfast. Patient taking differently: Take 500 mg by mouth daily with lunch. 09/22/22  Yes Brita Romp, NP  pantoprazole (PROTONIX) 40 MG tablet Take 40 mg by mouth daily. 10/26/21  Yes [provider]  potassium chloride SA (KLOR-CON M) 20 MEQ tablet Take 2 tablets (40 mEq total) by mouth daily for 2 doses. 02/08/23 02/10/23 Yes Mahon, Lenise Arena, NP  spironolactone (ALDACTONE) 100 MG tablet Take 100 mg by mouth daily. 10/26/21  Yes [provider]  traZODone (DESYREL) 50 MG tablet Take 1 tablet (50 mg total) by mouth at bedtime as needed for sleep. 06/24/15  Yes Rankin, Shuvon B, NP  Vitamin D, Ergocalciferol, (DRISDOL) 1.25 MG (50000 UNIT) CAPS capsule Take 1 capsule (50,000 Units total) by mouth every 7 (seven) days. 07/25/22  Yes Reardon, Juanetta Beets, NP   Camphor-Menthol-Methyl Sal (SALONPAS) 3.12-31-08 % PTCH Place 1 patch onto the skin daily as needed (oaub).    [provider]  fluticasone (FLONASE) 50 MCG/ACT nasal spray Place 2 sprays into both nostrils daily. Patient taking differently: Place 2 sprays into both nostrils daily as needed for allergies. 09/22/22   Paseda, Dewaine Conger, FNP  gabapentin (NEURONTIN) 300 MG capsule Take 1 capsule (300 mg total) by mouth daily as needed (nerve pain). 11/25/22   Brita Romp, NP  Glucagon, rDNA, (GLUCAGON EMERGENCY) 1 MG KIT Inject 1 mg into the muscle as needed (low blood sugar). 08/02/21   [provider]  ondansetron (ZOFRAN) 4 MG tablet Take 1 tablet (4 mg total) by mouth every 8 (eight) hours as needed. 12/15/22   Johnette Abraham, MD    Allergies as of 01/16/2023   (No Known Allergies)    Family History  Problem Relation Age of Onset   Diabetes Mother    Hypertension Mother    Alcohol abuse Mother    Cancer Mother    Hypertension Father    Arthritis Father    Heart disease Father    Cancer Daughter    Obesity Daughter    Drug abuse Son    Breast cancer Neg Hx     Social History   Socioeconomic History   Marital status: Divorced    Spouse name: Not on file   Number of children: 2   Years of education: 12   Highest education level: 12th grade  Occupational History   Not on file  Tobacco Use   Smoking status: Every Day    Packs/day: 1.00    Years: 40.00    Total pack years: 40.00    Types: Cigarettes    Passive exposure: Current   Smokeless tobacco: Never   Tobacco comments:    Smoking Cessation Classes Offered.  Vaping Use   Vaping Use: Former  Substance and Sexual Activity   Alcohol use: Yes    Alcohol/week: 10.0 standard drinks of alcohol    Types: 10 Glasses of wine per week    Comment: occassional wine- 2-3 times weekly   Drug use: Never   Sexual activity: Not Currently    Partners: Male    Birth control/protection: Abstinence,  Post-menopausal, None  Other Topics Concern   Not on file  Social History Narrative   Lives with her room mate    Social Determinants of Health   Financial Resource Strain: Low Risk  (01/16/2023)   Overall Financial Resource Strain (CARDIA)    Difficulty of Paying Living Expenses: Not very hard  Food Insecurity: No Food Insecurity (01/16/2023)   Hunger Vital Sign    Worried About Running Out of Food in the Last Year: Never true    Ran Out of  Food in the Last Year: Never true  Transportation Needs: Unmet Transportation Needs (01/16/2023)   PRAPARE - Hydrologist (Medical): Yes    Lack of Transportation (Non-Medical): No  Physical Activity: Inactive (01/16/2023)   Exercise Vital Sign    Days of Exercise per Week: 0 days    Minutes of Exercise per Session: 0 min  Stress: Stress Concern Present (01/16/2023)   [place]    Feeling of Stress : Rather much  Social Connections: Socially Isolated (01/16/2023)   Social Connection and Isolation Panel [NHANES]    Frequency of Communication with Friends and Family: Once a week    Frequency of Social Gatherings with Friends and Family: Once a week    Attends Religious Services: More than 4 times per year    Active Member of [employer] or Organizations: No    Attends Archivist Meetings: Never    Marital Status: Divorced  Human resources officer Violence: Not At Risk (10/07/2022)   Humiliation, Afraid, Rape, and Kick questionnaire    Fear of Current or Ex-Partner: No    Emotionally Abused: No    Physically Abused: No    Sexually Abused: No    Review of Systems: General: Negative for fever, chills, fatigue, weakness. Eyes: Negative for vision changes.  ENT: Negative for hoarseness, difficulty swallowing , nasal congestion. CV: Negative for chest pain, angina, palpitations, dyspnea on exertion, peripheral edema.  Respiratory: Negative for dyspnea at  rest, dyspnea on exertion, cough, sputum, wheezing.  GI: See history of present illness. GU:  Negative for dysuria, hematuria, urinary incontinence, urinary frequency, nocturnal urination.  MS: Negative for joint pain, low back pain.  Derm: Negative for rash or itching.  Neuro: Negative for weakness, abnormal sensation, seizure, frequent headaches, memory loss, confusion.  Psych: Negative for anxiety, depression Endo: Negative for unusual weight change.  Heme: Negative for bruising or bleeding. Allergy: Negative for rash or hives.  Physical Exam: Vital signs in last 24 hours: Temp:  [98.8 F (37.1 C)] 98.8 F (37.1 C) (02/16 0634) Pulse Rate:  [118] 118 (02/16 0634) Resp:  [16] 16 (02/16 0634) BP: (119)/(51) 119/51 (02/16 0634) SpO2:  [94 %] 94 % (02/16 0634) Weight:  [80.5 kg] 80.5 kg (02/16 0619)   General:   Alert,  Well-developed, well-nourished, pleasant and cooperative in NAD Head:  Normocephalic and atraumatic. Eyes:  Sclera clear, no icterus.   Conjunctiva pink. Ears:  Normal auditory acuity. Nose:  No deformity, discharge,  or lesions. Msk:  Symmetrical without gross deformities. Normal posture. Extremities:  Without clubbing or edema. Neurologic:  Alert and  oriented x4;  grossly normal neurologically. Skin:  Intact without significant lesions or rashes. Psych:  Alert and cooperative. Normal mood and affect.   Impression/Plan: Michelle Michael is here for an EGD to be performed for cirrhosis/variceal screening and chronic GERD  Risks, benefits, limitations, imponderables and alternatives regarding EGD have been reviewed with the patient. Questions have been answered. All parties agreeable.

## 2023-02-10 NOTE — Transfer of Care (Signed)
Immediate Anesthesia Transfer of Care Note  Patient: Michelle Michael  Procedure(s) Performed: ESOPHAGOGASTRODUODENOSCOPY (EGD) WITH PROPOFOL BIOPSY ESOPHAGEAL BRUSHING  Patient Location: Short Stay  Anesthesia Type:General  Level of Consciousness: awake, alert , oriented, and patient cooperative  Airway & Oxygen Therapy: Patient Spontanous Breathing  Post-op Assessment: Report given to RN, Post -op Vital signs reviewed and stable, and Patient moving all extremities  Post vital signs: Reviewed and stable  Last Vitals:  Vitals Value Taken Time  BP    Temp    Pulse    Resp    SpO2      Last Pain:  Vitals:   02/10/23 0732  TempSrc:   PainSc: 0-No pain      Patients Stated Pain Goal: 5 ([phone] [phone])  Complications: No notable events documented.

## 2023-02-10 NOTE — Op Note (Signed)
[facility] Patient Name: Michelle Michael Procedure Date: 02/10/2023 7:13 AM MRN: [id number] Date of Birth: Jun 23, 1961 Attending MD: Elon Alas. Abbey Chatters , [place], [id number] CSN: [id number] Age: 62 Admit Type: Outpatient Procedure:                Upper GI endoscopy Indications:              Heartburn, Cirrhosis rule out esophageal varices Providers:                Elon Alas. Abbey Chatters, DO, Crystal Page, Randa Spike, Technician Referring MD:              Medicines:                See the Anesthesia note for documentation of the                            administered medications Complications:            No immediate complications. Estimated Blood Loss:     Estimated blood loss was minimal. Procedure:                Pre-Anesthesia Assessment:                           - The anesthesia plan was to use monitored                            anesthesia care (MAC).                           After obtaining informed consent, the endoscope was                            passed under direct vision. Throughout the                            procedure, the patient's blood pressure, pulse, and                            oxygen saturations were monitored continuously. The                            [id number]-[id number]) scope was introduced through the                            mouth, and advanced to the second part of duodenum.                            The upper GI endoscopy was accomplished without                            difficulty. The patient tolerated the procedure                            well. Scope  In: 7:38:53 AM Scope Out: 7:43:02 AM Total Procedure Duration: 0 hours 4 minutes 9 seconds  Findings:      There is no endoscopic evidence of varices in the entire esophagus.      LA Grade B (one or more mucosal breaks greater than 5 mm, not extending       between the tops of two mucosal folds) esophagitis with no bleeding was       found at the gastroesophageal  junction.      Non-severe esophagitis with no bleeding was found in the middle third of       the esophagus. Cells for cytology were obtained by brushing.      Diffuse moderate inflammation characterized by erosions, erythema and       friability was found in the entire examined stomach. Biopsies were taken       with a cold forceps for Helicobacter pylori testing. ?intestinal       metaplasia      The duodenal bulb, first portion of the duodenum and second portion of       the duodenum were normal. Impression:               - LA Grade B reflux esophagitis with no bleeding.                           - Non-severe candidiasis esophagitis with no                            bleeding. Cells for cytology obtained.                           - Gastritis. Biopsied.                           - Normal duodenal bulb, first portion of the                            duodenum and second portion of the duodenum. Moderate Sedation:      Per Anesthesia Care Recommendation:           - Patient has a contact number available for                            emergencies. The signs and symptoms of potential                            delayed complications were discussed with the                            patient. Return to normal activities tomorrow.                            Written discharge instructions were provided to the                            patient.                           - Resume previous diet.                           -  Continue present medications.                           - Await pathology results.                           - Repeat upper endoscopy in 2 years for screening                            purposes.                           - Return to GI clinic in 3 months.                           - Use Protonix (pantoprazole) 40 mg PO BID.                           - No ibuprofen, naproxen, or other non-steroidal                            anti-inflammatory drugs.                            - Treat for candidal esophagitis if cytology                            positive                           **PICTURES FROM ENDOSCOPY DID NOT TRANSFER TO NOTE** Procedure Code(s):        --- Professional ---                           [phone], Esophagogastroduodenoscopy, flexible,                            transoral; with biopsy, single or multiple Diagnosis Code(s):        --- Professional ---                           K21.00, Gastro-esophageal reflux disease with                            esophagitis, without bleeding                           B37.81, Candidal esophagitis                           K29.70, Gastritis, unspecified, without bleeding                           R12, Heartburn                           K74.60, Unspecified cirrhosis of liver CPT copyright 2022 American Medical Association. All rights reserved. The codes documented  in this report are preliminary and upon coder review may  be revised to meet current compliance requirements. Elon Alas. Abbey Chatters, DO [employer] Abbey Chatters, DO 02/10/2023 7:48:46 AM This report has been signed electronically. Number of Addenda: 0

## 2023-02-10 NOTE — Anesthesia Preprocedure Evaluation (Signed)
Anesthesia Evaluation  Patient identified by MRN, date of birth, ID band Patient awake    Reviewed: Allergy & Precautions, H&P , NPO status , Patient's Chart, lab work & pertinent test results, reviewed documented beta blocker date and time   Airway Mallampati: II  TM Distance: >3 FB Neck ROM: full    Dental no notable dental hx.    Pulmonary neg pulmonary ROS, Current Smoker and Patient abstained from smoking.   Pulmonary exam normal breath sounds clear to auscultation       Cardiovascular Exercise Tolerance: Good hypertension, negative cardio ROS  Rhythm:regular Rate:Normal     Neuro/Psych  PSYCHIATRIC DISORDERS Anxiety Depression     Neuromuscular disease negative neurological ROS  negative psych ROS   GI/Hepatic negative GI ROS, Neg liver ROS,GERD  ,,  Endo/Other  negative endocrine ROSdiabetes    Renal/GU Renal diseasenegative Renal ROS  negative genitourinary   Musculoskeletal   Abdominal   Peds  Hematology negative hematology ROS (+) Blood dyscrasia, anemia   Anesthesia Other Findings   Reproductive/Obstetrics negative OB ROS                             Anesthesia Physical Anesthesia Plan  ASA: 2  Anesthesia Plan: General   Post-op Pain Management:    Induction:   PONV Risk Score and Plan: Propofol infusion  Airway Management Planned:   Additional Equipment:   Intra-op Plan:   Post-operative Plan:   Informed Consent: I have reviewed the patients History and Physical, chart, labs and discussed the procedure including the risks, benefits and alternatives for the proposed anesthesia with the patient or authorized representative who has indicated his/her understanding and acceptance.     Dental Advisory Given  Plan Discussed with: CRNA  Anesthesia Plan Comments:        Anesthesia Quick Evaluation

## 2023-02-11 NOTE — Anesthesia Postprocedure Evaluation (Signed)
Anesthesia Post Note  Patient: Michelle Michael  Procedure(s) Performed: ESOPHAGOGASTRODUODENOSCOPY (EGD) WITH PROPOFOL BIOPSY ESOPHAGEAL BRUSHING  Patient location during evaluation: Phase II Anesthesia Type: General Level of consciousness: awake Pain management: pain level controlled Vital Signs Assessment: post-procedure vital signs reviewed and stable Respiratory status: spontaneous breathing and respiratory function stable Cardiovascular status: blood pressure returned to baseline and stable Postop Assessment: no headache and no apparent nausea or vomiting Anesthetic complications: no Comments: Late entry   No notable events documented.   Last Vitals:  Vitals:   02/10/23 0634 02/10/23 0752  BP: (!) 119/51 105/61  Pulse: (!) 118 (!) 110  Resp: 16 17  Temp: 37.1 C 37.1 C  SpO2: 94% 96%    Last Pain:  Vitals:   02/10/23 0752  TempSrc: Oral  PainSc: 0-No pain                 Louann Sjogren

## 2023-02-14 ENCOUNTER — Other Ambulatory Visit: Payer: Self-pay | Admitting: *Deleted

## 2023-02-14 ENCOUNTER — Encounter: Payer: Self-pay | Admitting: Internal Medicine

## 2023-02-14 DIAGNOSIS — K7030 Alcoholic cirrhosis of liver without ascites: Secondary | ICD-10-CM

## 2023-02-14 DIAGNOSIS — E876 Hypokalemia: Secondary | ICD-10-CM

## 2023-02-14 LAB — SURGICAL PATHOLOGY

## 2023-02-15 ENCOUNTER — Other Ambulatory Visit: Payer: Self-pay

## 2023-02-15 DIAGNOSIS — H9193 Unspecified hearing loss, bilateral: Secondary | ICD-10-CM

## 2023-02-16 ENCOUNTER — Other Ambulatory Visit: Payer: Self-pay | Admitting: *Deleted

## 2023-02-16 ENCOUNTER — Encounter (HOSPITAL_COMMUNITY): Payer: Self-pay | Admitting: Internal Medicine

## 2023-02-16 ENCOUNTER — Other Ambulatory Visit (HOSPITAL_COMMUNITY)
Admission: RE | Admit: 2023-02-16 | Discharge: 2023-02-16 | Disposition: A | Discharge status: Home / Self Care | Payer: Medicare HMO | Source: Ambulatory Visit | Attending: Gastroenterology | Admitting: Gastroenterology

## 2023-02-16 ENCOUNTER — Telehealth: Payer: Self-pay

## 2023-02-16 ENCOUNTER — Other Ambulatory Visit: Payer: Self-pay | Admitting: Gastroenterology

## 2023-02-16 DIAGNOSIS — E876 Hypokalemia: Secondary | ICD-10-CM | POA: Insufficient documentation

## 2023-02-16 DIAGNOSIS — K7030 Alcoholic cirrhosis of liver without ascites: Secondary | ICD-10-CM

## 2023-02-16 LAB — BASIC METABOLIC PANEL
Anion gap: 11 (ref 5–15)
BUN: 6 mg/dL — ABNORMAL LOW (ref 8–23)
CO2: 28 mmol/L (ref 22–32)
Calcium: 7.9 mg/dL — ABNORMAL LOW (ref 8.9–10.3)
Chloride: 91 mmol/L — ABNORMAL LOW (ref 98–111)
Creatinine, Ser: 0.81 mg/dL (ref 0.44–1.00)
GFR, Estimated: >60 mL/min (ref >60)
Glucose, Bld: 214 mg/dL — ABNORMAL HIGH (ref 70–99)
Potassium: 2.6 mmol/L — CL (ref 3.5–5.1)
Sodium: 130 mmol/L — ABNORMAL LOW (ref 135–145)

## 2023-02-16 MED ORDER — POTASSIUM CHLORIDE CRYS ER 20 MEQ PO TBCR: EXTENDED_RELEASE_TABLET | ORAL | 1 refills | Status: DC

## 2023-02-16 NOTE — Telephone Encounter (Signed)
The lab called with a critical potassium level of 2.6

## 2023-02-16 NOTE — Telephone Encounter (Signed)
Spoke to pt, informed her of results and recommendatios. Pt voiced understanding.

## 2023-02-28 ENCOUNTER — Other Ambulatory Visit (HOSPITAL_COMMUNITY)
Admission: RE | Admit: 2023-02-28 | Discharge: 2023-02-28 | Disposition: A | Discharge status: Home / Self Care | Payer: Medicare HMO | Source: Ambulatory Visit | Attending: Gastroenterology | Admitting: Gastroenterology

## 2023-02-28 DIAGNOSIS — E876 Hypokalemia: Secondary | ICD-10-CM | POA: Diagnosis not present

## 2023-02-28 LAB — BASIC METABOLIC PANEL
Anion gap: 15 (ref 5–15)
BUN: <5 mg/dL — ABNORMAL LOW (ref 8–23)
CO2: 27 mmol/L (ref 22–32)
Calcium: 8.2 mg/dL — ABNORMAL LOW (ref 8.9–10.3)
Chloride: 90 mmol/L — ABNORMAL LOW (ref 98–111)
Creatinine, Ser: 0.69 mg/dL (ref 0.44–1.00)
GFR, Estimated: >60 mL/min (ref >60)
Glucose, Bld: 197 mg/dL — ABNORMAL HIGH (ref 70–99)
Potassium: 3.2 mmol/L — ABNORMAL LOW (ref 3.5–5.1)
Sodium: 132 mmol/L — ABNORMAL LOW (ref 135–145)

## 2023-03-01 ENCOUNTER — Other Ambulatory Visit: Payer: Self-pay | Admitting: *Deleted

## 2023-03-01 DIAGNOSIS — K7030 Alcoholic cirrhosis of liver without ascites: Secondary | ICD-10-CM

## 2023-03-01 DIAGNOSIS — E876 Hypokalemia: Secondary | ICD-10-CM

## 2023-03-04 ENCOUNTER — Other Ambulatory Visit: Payer: Self-pay | Admitting: Internal Medicine

## 2023-03-04 DIAGNOSIS — K7030 Alcoholic cirrhosis of liver without ascites: Secondary | ICD-10-CM

## 2023-03-06 DIAGNOSIS — E119 Type 2 diabetes mellitus without complications: Secondary | ICD-10-CM | POA: Diagnosis not present

## 2023-03-13 ENCOUNTER — Ambulatory Visit (HOSPITAL_COMMUNITY)
Admission: RE | Admit: 2023-03-13 | Discharge: 2023-03-13 | Disposition: A | Discharge status: Home / Self Care | Payer: Medicare HMO | Source: Ambulatory Visit | Attending: Internal Medicine | Admitting: Internal Medicine

## 2023-03-13 DIAGNOSIS — I2510 Atherosclerotic heart disease of native coronary artery without angina pectoris: Secondary | ICD-10-CM | POA: Insufficient documentation

## 2023-03-13 DIAGNOSIS — K760 Fatty (change of) liver, not elsewhere classified: Secondary | ICD-10-CM | POA: Insufficient documentation

## 2023-03-13 DIAGNOSIS — K7460 Unspecified cirrhosis of liver: Secondary | ICD-10-CM | POA: Diagnosis not present

## 2023-03-13 DIAGNOSIS — I700 Atherosclerosis of aorta: Secondary | ICD-10-CM | POA: Diagnosis not present

## 2023-03-13 DIAGNOSIS — R188 Other ascites: Secondary | ICD-10-CM | POA: Insufficient documentation

## 2023-03-13 DIAGNOSIS — Z122 Encounter for screening for malignant neoplasm of respiratory organs: Secondary | ICD-10-CM | POA: Diagnosis not present

## 2023-03-13 DIAGNOSIS — F17210 Nicotine dependence, cigarettes, uncomplicated: Secondary | ICD-10-CM | POA: Insufficient documentation

## 2023-03-13 DIAGNOSIS — J439 Emphysema, unspecified: Secondary | ICD-10-CM | POA: Diagnosis not present

## 2023-03-13 DIAGNOSIS — Z0000 Encounter for general adult medical examination without abnormal findings: Secondary | ICD-10-CM | POA: Insufficient documentation

## 2023-03-13 DIAGNOSIS — R911 Solitary pulmonary nodule: Secondary | ICD-10-CM | POA: Insufficient documentation

## 2023-03-16 ENCOUNTER — Encounter: Payer: Self-pay | Admitting: Internal Medicine

## 2023-03-16 ENCOUNTER — Ambulatory Visit (INDEPENDENT_AMBULATORY_CARE_PROVIDER_SITE_OTHER): Payer: Medicare HMO | Admitting: Internal Medicine

## 2023-03-16 VITALS — BP 133/79 | HR 115 | Ht 65.0 in | Wt 168.4 lb

## 2023-03-16 DIAGNOSIS — J432 Centrilobular emphysema: Secondary | ICD-10-CM

## 2023-03-16 DIAGNOSIS — K7030 Alcoholic cirrhosis of liver without ascites: Secondary | ICD-10-CM

## 2023-03-16 DIAGNOSIS — J441 Chronic obstructive pulmonary disease with (acute) exacerbation: Secondary | ICD-10-CM | POA: Diagnosis not present

## 2023-03-16 DIAGNOSIS — E785 Hyperlipidemia, unspecified: Secondary | ICD-10-CM

## 2023-03-16 DIAGNOSIS — E876 Hypokalemia: Secondary | ICD-10-CM

## 2023-03-16 MED ORDER — PREDNISONE 20 MG PO TABS: 40.0000 mg | ORAL_TABLET | Freq: Every day | ORAL | 0 refills | Status: AC

## 2023-03-16 MED ORDER — POTASSIUM CHLORIDE 40 MEQ/15ML (20%) PO SOLN: 20.0000 meq | Freq: Every day | ORAL | 1 refills | Status: DC

## 2023-03-16 MED ORDER — TIOTROPIUM BROMIDE-OLODATEROL 2.5-2.5 MCG/ACT IN AERS: 2.0000 | INHALATION_SPRAY | Freq: Every day | RESPIRATORY_TRACT | 1 refills | Status: DC

## 2023-03-16 NOTE — Patient Instructions (Signed)
It was a pleasure to see you today.  Thank you for giving [place] the opportunity to be involved in your care.  Below is a brief recap of your visit and next steps.  We will plan to see you again in 2 weeks.  Summary Stiolto prescribed as a daily inhaler for likely underlying COPD I have ordered pulmonary function testing Prednisone x 5 days ordered for COPD exacerbation Potassium liquid ordered Repeat labs today Follow up in 2 weeks

## 2023-03-16 NOTE — Progress Notes (Signed)
Established Patient Office Visit  Subjective   Patient ID: Michelle Michael, female    DOB: [birth year]  Age: 62 y.o. MRN: [id number]  Chief Complaint  Patient presents with   Diabetes    Follow up   Ms. Michelle Michael returns to care today for follow-up.  She was last evaluated by me on 12/15/22.  No medication changes were made and 63-month follow-up was arranged.  In the interim she was evaluated by Dr. Court Joy for her Medicare AWV.  She has also undergone EGD with Dr. Abbey Chatters on 2/16 in the setting of alcoholic cirrhosis.  Grade B reflux esophagitis noted with no bleeding.  Gastritis noted as well.  Biopsies were obtained and were consistent with portal hypertensive gastropathy.  There have otherwise been no acute interval events.  Ms. Michelle Michael acute concerns today is shortness of breath.  She states that she is experiencing significant dyspnea with exertion and has had changes in the quality/quantity of sputum production.  She endorses significant fatigue.  She does not have any additional concerns to discuss.  Past Medical History:  Diagnosis Date   Anemia    Anxiety    Centrilobular emphysema ([place]) 03/22/2023   Cirrhosis ([place])    CKD (chronic kidney disease)    Depression    Diabetes mellitus, type II ([place])    GERD (gastroesophageal reflux disease)    Hyperlipidemia    Hypertension    Hypokalemia    Hyponatremia    Neuromuscular disorder ([place]) 2021   Substance abuse ([place])    Years ago   Ulcer 1980s   Past Surgical History:  Procedure Laterality Date   BIOPSY  02/10/2023   Procedure: BIOPSY;  Surgeon: Eloise Harman, DO;  [facility];  Service: Endoscopy;;   CESAREAN SECTION  1984   CHOLECYSTECTOMY     ESOPHAGEAL BRUSHING  02/10/2023   Procedure: ESOPHAGEAL BRUSHING;  Surgeon: Eloise Harman, DO;  [facility];  Service: Endoscopy;;   ESOPHAGOGASTRODUODENOSCOPY (EGD) WITH PROPOFOL N/A 02/10/2023   Procedure: ESOPHAGOGASTRODUODENOSCOPY (EGD) WITH PROPOFOL;   Surgeon: Eloise Harman, DO;  [facility];  Service: Endoscopy;  Laterality: N/A;  8:45 am   FRACTURE SURGERY  2005   ORTHOPEDIC SURGERY     SPINE SURGERY  2003   TUBAL LIGATION  1993   Social History   Tobacco Use   Smoking status: Every Day    Packs/day: 1.00    Years: 40.00    Additional pack years: 0.00    Total pack years: 40.00    Types: Cigarettes    Passive exposure: Current   Smokeless tobacco: Never   Tobacco comments:    Smoking Cessation Classes Offered.  Vaping Use   Vaping Use: Former  Substance Use Topics   Alcohol use: Yes    Alcohol/week: 10.0 standard drinks of alcohol    Types: 10 Glasses of wine per week    Comment: occassional wine- 2-3 times weekly   Drug use: Never   Family History  Problem Relation Age of Onset   Diabetes Mother    Hypertension Mother    Alcohol abuse Mother    Cancer Mother    Hypertension Father    Arthritis Father    Heart disease Father    Cancer Daughter    Obesity Daughter    Drug abuse Son    Breast cancer Neg Hx    No Known Allergies  Review of Systems  Constitutional:  Positive for malaise/fatigue.  Respiratory:  Positive for cough, sputum production, shortness of breath and wheezing.   Cardiovascular:  Negative for orthopnea and leg swelling.  All other systems reviewed and are negative.    Objective:     BP 133/79   Pulse (!) 115   Ht 5\' 5"  (1.651 m)   Wt 168 lb 6.4 oz (76.4 kg)   SpO2 95%   BMI 28.02 kg/m  BP Readings from Last 3 Encounters:  03/16/23 133/79  02/10/23 105/61  02/08/23 (!) 116/57   Physical Exam Vitals reviewed.  Constitutional:      General: She is not in acute distress.    Appearance: Normal appearance. She is obese. She is not toxic-appearing.  HENT:     Head: Normocephalic and atraumatic.     Right Ear: External ear normal.     Left Ear: External ear normal.     Nose: Nose normal. No congestion or rhinorrhea.     Mouth/Throat:     Mouth: Mucous membranes  are moist.     Pharynx: Oropharynx is clear. No oropharyngeal exudate or posterior oropharyngeal erythema.  Eyes:     General: No scleral icterus.    Extraocular Movements: Extraocular movements intact.     Conjunctiva/sclera: Conjunctivae normal.     Pupils: Pupils are equal, round, and reactive to light.  Cardiovascular:     Rate and Rhythm: Normal rate and regular rhythm.     Pulses: Normal pulses.     Heart sounds: Normal heart sounds. No murmur heard.    No friction rub. No gallop.  Pulmonary:     Effort: Pulmonary effort is normal.     Breath sounds: Wheezing (Diffuse bilateral wheezes on exam) present. No rhonchi or rales.  Abdominal:     General: Abdomen is flat. Bowel sounds are normal. There is no distension.     Palpations: Abdomen is soft.     Tenderness: There is no abdominal tenderness.  Musculoskeletal:        General: No swelling. Normal range of motion.     Cervical back: Normal range of motion.     Right lower leg: No edema.     Left lower leg: No edema.  Lymphadenopathy:     Cervical: No cervical adenopathy.  Skin:    General: Skin is warm and dry.     Capillary Refill: Capillary refill takes less than 2 seconds.     Coloration: Skin is not jaundiced.  Neurological:     General: No focal deficit present.     Mental Status: She is alert and oriented to person, place, and time.     Gait: Gait abnormal.  Psychiatric:        Mood and Affect: Mood normal.        Behavior: Behavior normal.   Last CBC Lab Results  Component Value Date   WBC 12.0 (H) 03/16/2023   HGB 12.8 03/16/2023   HCT 33.9 (L) 03/16/2023   MCV 100 (H) 03/16/2023   MCH 37.8 (H) 03/16/2023   RDW 12.0 03/16/2023   PLT 206 [id number]   Last metabolic panel Lab Results  Component Value Date   GLUCOSE 181 (H) 03/16/2023   NA 131 (L) 03/16/2023   K 3.0 (L) 03/16/2023   CL 89 (L) 03/16/2023   CO2 23 03/16/2023   BUN 5 (L) 03/16/2023   CREATININE 0.73 03/16/2023   EGFR 94 03/16/2023    CALCIUM 8.5 (L) 03/16/2023   PROT 6.3 03/16/2023   ALBUMIN 3.1 (L) 03/16/2023  LABGLOB 3.2 03/16/2023   AGRATIO 1.0 (L) 03/16/2023   BILITOT 3.9 (H) 03/16/2023   ALKPHOS 348 (H) 03/16/2023   AST 99 (H) 03/16/2023   ALT 23 03/16/2023   ANIONGAP 15 02/28/2023   Last lipids Lab Results  Component Value Date   CHOL 204 (H) 12/16/2022   HDL 57 12/16/2022   LDLCALC 128 (H) 12/16/2022   TRIG 97 12/16/2022   CHOLHDL 3.6 12/16/2022   Last hemoglobin A1c Lab Results  Component Value Date   HGBA1C 5.7 (A) 11/25/2022   Last thyroid functions Lab Results  Component Value Date   TSH 1.972 12/16/2022   Last vitamin D Lab Results  Component Value Date   VD25OH 101.67 (H) 12/16/2022   Last vitamin B12 and Folate Lab Results  Component Value Date   [id number] 12/16/2022   FOLATE 2.6 (L) 12/16/2022   The 10-year ASCVD risk score (Arnett DK, et al., 2019) is: 19.5%    Assessment & Plan:   Problem List Items Addressed This Visit       Centrilobular emphysema ([place]) - Primary    Her acute concern today is shortness of breath, dyspnea with exertion, and increased frequency of cough/sputum production.  She continues to smoke cigarettes.  Underwent LDCT earlier this month that demonstrated centrilobular and paraseptal emphysema with bronchial wall thickening, suggestive of underlying emphysema.  She has an albuterol inhaler for as needed use, the frequency of which has increased recently. -Her current symptoms are concerning for COPD exacerbation.  I have prescribed prednisone 40 mg x 5 days -Will additionally start Stiolto given recent imaging suggestive of underlying COPD -PFTs ordered today to further evaluate underlying airway disease      Cirrhosis ([place])    Presumed alcoholic cirrhosis in the setting of alcohol use disorder.  She continues to drink alcohol.  Followed by gastroenterology (Dr. Abbey Chatters).  Underwent EGD last month, revealing esophagitis/gastritis consistent with  portal hypertensive gastropathy.  Appears compensated today.  Currently prescribed Lasix/Aldactone. -No medication changes today. -Repeat labs ordered      Hypokalemia    K+ 3.2 on labs from earlier this month.  She is currently prescribed oral potassium supplementation, but has difficulty taking pills.  She has requested a liquid potassium solution today. -Repeat labs ordered today -Potassium chloride solution prescribed      Dyslipidemia, goal LDL below 70    Lipid panel last updated in December 2023.  Total cholesterol 204 and LDL 128.  She is not currently on statin therapy due to cirrhosis.  She has previously been referred to the lipid clinic for [place], but has not been seen due to transportation issues. -Will work on starting [place] today given contraindication for statin therapy and LDL that remains significantly above goal ( < 70)      Return in about 2 weeks (around 03/30/2023).   Johnette Abraham, MD

## 2023-03-17 ENCOUNTER — Encounter: Payer: Self-pay | Admitting: Nurse Practitioner

## 2023-03-17 LAB — CBC WITH DIFFERENTIAL/PLATELET
Basophils Absolute: 0.1 10*3/uL (ref 0.0–0.2)
Basos: 1 %
EOS (ABSOLUTE): 0 10*3/uL (ref 0.0–0.4)
Eos: 0 %
Hematocrit: 33.9 % — ABNORMAL LOW (ref 34.0–46.6)
Hemoglobin: 12.8 g/dL (ref 11.1–15.9)
Immature Grans (Abs): 0.1 10*3/uL (ref 0.0–0.1)
Immature Granulocytes: 1 %
Lymphocytes Absolute: 1.4 10*3/uL (ref 0.7–3.1)
Lymphs: 12 %
MCH: 37.8 pg — ABNORMAL HIGH (ref 26.6–33.0)
MCHC: 37.8 g/dL — ABNORMAL HIGH (ref 31.5–35.7)
MCV: 100 fL — ABNORMAL HIGH (ref 79–97)
Monocytes Absolute: 1 10*3/uL — ABNORMAL HIGH (ref 0.1–0.9)
Monocytes: 9 %
Neutrophils Absolute: 9.4 10*3/uL — ABNORMAL HIGH (ref 1.4–7.0)
Neutrophils: 77 %
Platelets: 206 10*3/uL (ref 150–450)
RBC: 3.39 x10E6/uL — ABNORMAL LOW (ref 3.77–5.28)
RDW: 12 % (ref 11.7–15.4)
WBC: 12 10*3/uL — ABNORMAL HIGH (ref 3.4–10.8)

## 2023-03-17 LAB — CMP14+EGFR
ALT: 23 IU/L (ref 0–32)
AST: 99 IU/L — ABNORMAL HIGH (ref 0–40)
Albumin/Globulin Ratio: 1 — ABNORMAL LOW (ref 1.2–2.2)
Albumin: 3.1 g/dL — ABNORMAL LOW (ref 3.9–4.9)
Alkaline Phosphatase: 348 IU/L — ABNORMAL HIGH (ref 44–121)
BUN/Creatinine Ratio: 7 — ABNORMAL LOW (ref 12–28)
BUN: 5 mg/dL — ABNORMAL LOW (ref 8–27)
Bilirubin Total: 3.9 mg/dL — ABNORMAL HIGH (ref 0.0–1.2)
CO2: 23 mmol/L (ref 20–29)
Calcium: 8.5 mg/dL — ABNORMAL LOW (ref 8.7–10.3)
Chloride: 89 mmol/L — ABNORMAL LOW (ref 96–106)
Creatinine, Ser: 0.73 mg/dL (ref 0.57–1.00)
Globulin, Total: 3.2 g/dL (ref 1.5–4.5)
Glucose: 181 mg/dL — ABNORMAL HIGH (ref 70–99)
Potassium: 3 mmol/L — ABNORMAL LOW (ref 3.5–5.2)
Sodium: 131 mmol/L — ABNORMAL LOW (ref 134–144)
Total Protein: 6.3 g/dL (ref 6.0–8.5)
eGFR: 94 mL/min/{1.73_m2} (ref >59)

## 2023-03-20 ENCOUNTER — Ambulatory Visit (HOSPITAL_COMMUNITY)
Admission: RE | Admit: 2023-03-20 | Discharge: 2023-03-20 | Disposition: A | Discharge status: Home / Self Care | Payer: Medicare HMO | Source: Ambulatory Visit | Attending: Internal Medicine | Admitting: Internal Medicine

## 2023-03-20 DIAGNOSIS — J432 Centrilobular emphysema: Secondary | ICD-10-CM | POA: Diagnosis not present

## 2023-03-20 LAB — PULMONARY FUNCTION TEST
DL/VA % pred: 99 %
DL/VA: 4.15 ml/min/mmHg/L
DLCO cor % pred: 69 %
DLCO cor: 14.39 ml/min/mmHg
DLCO unc % pred: 67 %
DLCO unc: 14.12 ml/min/mmHg
FEF 25-75 Post: 2.08 L/sec
FEF 25-75 Pre: 1.28 L/sec
FEF2575-%Change-Post: 62 %
FEF2575-%Pred-Post: 88 %
FEF2575-%Pred-Pre: 54 %
FEV1-%Change-Post: 16 %
FEV1-%Pred-Post: 71 %
FEV1-%Pred-Pre: 61 %
FEV1-Post: 1.88 L
FEV1-Pre: 1.61 L
FEV1FVC-%Change-Post: -1 %
FEV1FVC-%Pred-Pre: 95 %
FEV6-%Change-Post: 18 %
FEV6-%Pred-Post: 76 %
FEV6-%Pred-Pre: 64 %
FEV6-Post: 2.52 L
FEV6-Pre: 2.12 L
FEV6FVC-%Change-Post: 0 %
FEV6FVC-%Pred-Post: 102 %
FEV6FVC-%Pred-Pre: 101 %
FVC-%Change-Post: 17 %
FVC-%Pred-Post: 74 %
FVC-%Pred-Pre: 63 %
FVC-Post: 2.55 L
FVC-Pre: 2.16 L
Post FEV1/FVC ratio: 74 %
Post FEV6/FVC ratio: 99 %
Pre FEV1/FVC ratio: 74 %
Pre FEV6/FVC Ratio: 98 %
RV % pred: 118 %
RV: 2.44 L
TLC % pred: 83 %
TLC: 4.36 L

## 2023-03-20 MED ORDER — ALBUTEROL SULFATE (2.5 MG/3ML) 0.083% IN NEBU
2.5000 mg | INHALATION_SOLUTION | Freq: Once | RESPIRATORY_TRACT | Status: AC
  Administered 2023-03-20: 2.5 mg via RESPIRATORY_TRACT

## 2023-03-21 ENCOUNTER — Encounter: Payer: Self-pay | Admitting: Internal Medicine

## 2023-03-22 ENCOUNTER — Encounter: Payer: Self-pay | Admitting: Internal Medicine

## 2023-03-22 DIAGNOSIS — J432 Centrilobular emphysema: Secondary | ICD-10-CM | POA: Insufficient documentation

## 2023-03-22 HISTORY — DX: Centrilobular emphysema: J43.2

## 2023-03-22 NOTE — Assessment & Plan Note (Signed)
Lipid panel last updated in December 2023.  Total cholesterol 204 and LDL 128.  She is not currently on statin therapy due to cirrhosis.  She has previously been referred to the lipid clinic for [place], but has not been seen due to transportation issues. -Will work on starting [place] today given contraindication for statin therapy and LDL that remains significantly above goal ( < 70)

## 2023-03-22 NOTE — Assessment & Plan Note (Signed)
Presumed alcoholic cirrhosis in the setting of alcohol use disorder.  She continues to drink alcohol.  Followed by gastroenterology (Dr. Abbey Chatters).  Underwent EGD last month, revealing esophagitis/gastritis consistent with portal hypertensive gastropathy.  Appears compensated today.  Currently prescribed Lasix/Aldactone. -No medication changes today. -Repeat labs ordered

## 2023-03-22 NOTE — Assessment & Plan Note (Signed)
K+ 3.2 on labs from earlier this month.  She is currently prescribed oral potassium supplementation, but has difficulty taking pills.  She has requested a liquid potassium solution today. -Repeat labs ordered today -Potassium chloride solution prescribed

## 2023-03-22 NOTE — Assessment & Plan Note (Addendum)
Her acute concern today is shortness of breath, dyspnea with exertion, and increased frequency of cough/sputum production.  She continues to smoke cigarettes.  Underwent LDCT earlier this month that demonstrated centrilobular and paraseptal emphysema with bronchial wall thickening, suggestive of underlying emphysema.  She has an albuterol inhaler for as needed use, the frequency of which has increased recently. -Her current symptoms are concerning for COPD exacerbation.  I have prescribed prednisone 40 mg x 5 days -Will additionally start Stiolto given recent imaging suggestive of underlying COPD -PFTs ordered today to further evaluate underlying airway disease

## 2023-03-24 ENCOUNTER — Other Ambulatory Visit: Payer: Self-pay | Admitting: Internal Medicine

## 2023-03-24 DIAGNOSIS — F17200 Nicotine dependence, unspecified, uncomplicated: Secondary | ICD-10-CM

## 2023-03-24 DIAGNOSIS — K7030 Alcoholic cirrhosis of liver without ascites: Secondary | ICD-10-CM

## 2023-03-30 ENCOUNTER — Encounter: Payer: Self-pay | Admitting: Internal Medicine

## 2023-03-30 ENCOUNTER — Ambulatory Visit (INDEPENDENT_AMBULATORY_CARE_PROVIDER_SITE_OTHER): Payer: Medicare HMO | Admitting: Internal Medicine

## 2023-03-30 VITALS — BP 117/55 | HR 114 | Ht 65.0 in | Wt 171.2 lb

## 2023-03-30 DIAGNOSIS — E1140 Type 2 diabetes mellitus with diabetic neuropathy, unspecified: Secondary | ICD-10-CM | POA: Diagnosis not present

## 2023-03-30 DIAGNOSIS — G629 Polyneuropathy, unspecified: Secondary | ICD-10-CM | POA: Diagnosis not present

## 2023-03-30 DIAGNOSIS — Z9189 Other specified personal risk factors, not elsewhere classified: Secondary | ICD-10-CM

## 2023-03-30 DIAGNOSIS — E876 Hypokalemia: Secondary | ICD-10-CM

## 2023-03-30 DIAGNOSIS — Z5309 Procedure and treatment not carried out because of other contraindication: Secondary | ICD-10-CM | POA: Diagnosis not present

## 2023-03-30 DIAGNOSIS — J432 Centrilobular emphysema: Secondary | ICD-10-CM | POA: Diagnosis not present

## 2023-03-30 DIAGNOSIS — Z794 Long term (current) use of insulin: Secondary | ICD-10-CM

## 2023-03-30 DIAGNOSIS — E785 Hyperlipidemia, unspecified: Secondary | ICD-10-CM | POA: Diagnosis not present

## 2023-03-30 MED ORDER — REPATHA 140 MG/ML ~~LOC~~ SOSY: 140.0000 mg | PREFILLED_SYRINGE | SUBCUTANEOUS | 1 refills | Status: DC

## 2023-03-30 MED ORDER — GABAPENTIN 600 MG PO TABS: 600.0000 mg | ORAL_TABLET | Freq: Three times a day (TID) | ORAL | 1 refills | Status: DC

## 2023-03-30 MED ORDER — GABAPENTIN 300 MG PO CAPS: 300.0000 mg | ORAL_CAPSULE | Freq: Every day | ORAL | 1 refills | Status: DC | PRN

## 2023-03-30 NOTE — Assessment & Plan Note (Signed)
Previously referred to the advanced lipid clinic for [place] in the setting of significantly elevated total cholesterol and LDL levels.  She is not currently statin therapy due to cirrhosis.  She has not been able to establish care at the lipid clinic due to transportation issues. -Repatha prescribed today given contraindication for statin therapy and LDL that remains significantly above goal

## 2023-03-30 NOTE — Assessment & Plan Note (Signed)
Noted on previous imaging.  PFTs not consistent with COPD.  Stiolto was added at her last appointment.  She endorses improvement in her respiratory status with daily use of Stiolto.  Frequency of rescue inhaler use has decreased.  Pulmonary exam is unremarkable today. -No additional medication changes today

## 2023-03-30 NOTE — Progress Notes (Signed)
Established Patient Office Visit  Subjective   Patient ID: Michelle Michael, female    DOB: November 29, 1961  Age: 62 y.o. MRN: [id number]  Chief Complaint  Patient presents with   Hypokalemia    Follow up   Michelle Michael returns to care today for 2-week follow-up.  She was last evaluated by me on 3/21.  At that time she endorsed shortness of breath with significant dyspnea on exertion.  She additionally endorsed changes in the quality/quantity of sputum production.  Prednisone 40 mg daily x 5 days was prescribed for treatment of presumed COPD exacerbation.  She has previous imaging findings suggestive of centrilobular emphysema.  PFTs were ordered and Stiolto was added in the setting of imaging suggestive of underlying COPD.  Potassium chloride solution was also prescribed for improved tolerance of potassium supplementation.  2-week follow-up was arranged for reevaluation of her symptoms.  Today Michelle Michael states that her respiratory status has significantly improved.  Stiolto has been beneficial.  She denies shortness of breath currently.  She has been able to tolerate potassium chloride solution.  Her acute concern today is worsening neuropathic pain.  She is currently prescribed gabapentin 600 mg twice daily and has a 300 mg tablet that can be taken as needed.  She became tearful when discussing how her current symptoms impair her quality of life.  She is interested in making adjustments to her gabapentin regimen today.  She otherwise has no additional concerns to discuss.  Past Medical History:  Diagnosis Date   Anemia    Anxiety    Centrilobular emphysema 03/22/2023   Cirrhosis    CKD (chronic kidney disease)    Depression    Diabetes mellitus, type II    GERD (gastroesophageal reflux disease)    Hyperlipidemia    Hypertension    Hypokalemia    Hyponatremia    Neuromuscular disorder 2021   Substance abuse    Years ago   Ulcer 1980s   Past Surgical History:  Procedure Laterality Date    BIOPSY  02/10/2023   Procedure: BIOPSY;  Surgeon: Eloise Harman, DO;  [facility];  Service: Endoscopy;;   CESAREAN SECTION  1984   CHOLECYSTECTOMY     ESOPHAGEAL BRUSHING  02/10/2023   Procedure: ESOPHAGEAL BRUSHING;  Surgeon: Eloise Harman, DO;  [facility];  Service: Endoscopy;;   ESOPHAGOGASTRODUODENOSCOPY (EGD) WITH PROPOFOL N/A 02/10/2023   Procedure: ESOPHAGOGASTRODUODENOSCOPY (EGD) WITH PROPOFOL;  Surgeon: Eloise Harman, DO;  [facility];  Service: Endoscopy;  Laterality: N/A;  8:45 am   FRACTURE SURGERY  2005   ORTHOPEDIC SURGERY     SPINE SURGERY  2003   TUBAL LIGATION  1993   Social History   Tobacco Use   Smoking status: Every Day    Packs/day: 1.00    Years: 40.00    Additional pack years: 0.00    Total pack years: 40.00    Types: Cigarettes    Passive exposure: Current   Smokeless tobacco: Never   Tobacco comments:    Smoking Cessation Classes Offered.  Vaping Use   Vaping Use: Former  Substance Use Topics   Alcohol use: Yes    Alcohol/week: 10.0 standard drinks of alcohol    Types: 10 Glasses of wine per week    Comment: occassional wine- 2-3 times weekly   Drug use: Never   Family History  Problem Relation Age of Onset   Diabetes Mother    Hypertension Mother  Alcohol abuse Mother    Cancer Mother    Hypertension Father    Arthritis Father    Heart disease Father    Cancer Daughter    Obesity Daughter    Drug abuse Son    Breast cancer Neg Hx    No Known Allergies  Review of Systems  Neurological:  Positive for tingling.  All other systems reviewed and are negative.    Objective:     BP (!) 117/55   Pulse (!) 114   Ht 5\' 5"  (1.651 m)   Wt 171 lb 3.2 oz (77.7 kg)   SpO2 93%   BMI 28.49 kg/m  BP Readings from Last 3 Encounters:  03/30/23 (!) 117/55  03/16/23 133/79  02/10/23 105/61   Physical Exam Constitutional:      General: She is not in acute distress.    Appearance: Normal  appearance. She is obese. She is not toxic-appearing.  HENT:     Head: Normocephalic and atraumatic.     Right Ear: External ear normal.     Left Ear: External ear normal.     Nose: Nose normal. No congestion or rhinorrhea.     Mouth/Throat:     Mouth: Mucous membranes are moist.     Pharynx: Oropharynx is clear. No oropharyngeal exudate or posterior oropharyngeal erythema.  Eyes:     General: No scleral icterus.    Extraocular Movements: Extraocular movements intact.     Conjunctiva/sclera: Conjunctivae normal.     Pupils: Pupils are equal, round, and reactive to light.  Cardiovascular:     Rate and Rhythm: Normal rate and regular rhythm.     Pulses: Normal pulses.     Heart sounds: Normal heart sounds. No murmur heard.    No friction rub. No gallop.  Pulmonary:     Effort: Pulmonary effort is normal.     Breath sounds: Normal breath sounds. No wheezing, rhonchi or rales.  Abdominal:     General: Abdomen is flat. Bowel sounds are normal. There is no distension.     Palpations: Abdomen is soft.     Tenderness: There is no abdominal tenderness.  Musculoskeletal:        General: No swelling. Normal range of motion.     Cervical back: Normal range of motion.     Right lower leg: No edema.     Left lower leg: No edema.  Lymphadenopathy:     Cervical: No cervical adenopathy.  Skin:    General: Skin is warm and dry.     Capillary Refill: Capillary refill takes less than 2 seconds.     Coloration: Skin is not jaundiced.  Neurological:     General: No focal deficit present.     Mental Status: She is alert and oriented to person, place, and time.  Psychiatric:        Mood and Affect: Mood normal.        Behavior: Behavior normal.   Last CBC Lab Results  Component Value Date   WBC 12.0 (H) 03/16/2023   HGB 12.8 03/16/2023   HCT 33.9 (L) 03/16/2023   MCV 100 (H) 03/16/2023   MCH 37.8 (H) 03/16/2023   RDW 12.0 03/16/2023   PLT 206 [id number]   Last metabolic panel Lab  Results  Component Value Date   GLUCOSE 181 (H) 03/16/2023   NA 131 (L) 03/16/2023   K 3.0 (L) 03/16/2023   CL 89 (L) 03/16/2023   CO2 23 03/16/2023   BUN 5 (  L) 03/16/2023   CREATININE 0.73 03/16/2023   EGFR 94 03/16/2023   CALCIUM 8.5 (L) 03/16/2023   PROT 6.3 03/16/2023   ALBUMIN 3.1 (L) 03/16/2023   LABGLOB 3.2 03/16/2023   AGRATIO 1.0 (L) 03/16/2023   BILITOT 3.9 (H) 03/16/2023   ALKPHOS 348 (H) 03/16/2023   AST 99 (H) 03/16/2023   ALT 23 03/16/2023   ANIONGAP 15 02/28/2023   Last lipids Lab Results  Component Value Date   CHOL 204 (H) 12/16/2022   HDL 57 12/16/2022   LDLCALC 128 (H) 12/16/2022   TRIG 97 12/16/2022   CHOLHDL 3.6 12/16/2022   Last hemoglobin A1c Lab Results  Component Value Date   HGBA1C 5.7 (A) 11/25/2022   Last thyroid functions Lab Results  Component Value Date   TSH 1.972 12/16/2022   Last vitamin D Lab Results  Component Value Date   VD25OH 101.67 (H) 12/16/2022   Last vitamin B12 and Folate Lab Results  Component Value Date   [id number] 12/16/2022   FOLATE 2.6 (L) 12/16/2022   The 10-year ASCVD risk score (Arnett DK, et al., 2019) is: 15.4%    Assessment & Plan:   Problem List Items Addressed This Visit       Centrilobular emphysema    Noted on previous imaging.  PFTs not consistent with COPD.  Stiolto was added at her last appointment.  She endorses improvement in her respiratory status with daily use of Stiolto.  Frequency of rescue inhaler use has decreased.  Pulmonary exam is unremarkable today. -No additional medication changes today       Neuropathy    Her acute concern today is worsening neuropathic pain in her lower extremities.  She is currently prescribed gabapentin 600 mg twice daily and has a 300 mg tablet can be taken as needed.  She typically takes 300 mg in the middle of the day.  She became tearful when discussing how her neuropathy impairs her quality of life and would like to increase gabapentin if  possible. -Increase gabapentin to 600 mg 3 times daily.  Okay to continue 300 mg as needed, which is likely needed at night as that is when symptoms are the worst.      Hypokalemia    K+ 3.0 on labs from last month.  Potassium chloride solution was prescribed for improved tolerance and I recommended that she increase daily supplementation to 40 mEq. -Repeat BMP ordered today      Dyslipidemia, goal LDL below 70    Previously referred to the advanced lipid clinic for [place] in the setting of significantly elevated total cholesterol and LDL levels.  She is not currently statin therapy due to cirrhosis.  She has not been able to establish care at the lipid clinic due to transportation issues. -Repatha prescribed today given contraindication for statin therapy and LDL that remains significantly above goal      Return in about 3 months (around 06/29/2023).   Johnette Abraham, MD

## 2023-03-30 NOTE — Assessment & Plan Note (Signed)
Her acute concern today is worsening neuropathic pain in her lower extremities.  She is currently prescribed gabapentin 600 mg twice daily and has a 300 mg tablet can be taken as needed.  She typically takes 300 mg in the middle of the day.  She became tearful when discussing how her neuropathy impairs her quality of life and would like to increase gabapentin if possible. -Increase gabapentin to 600 mg 3 times daily.  Okay to continue 300 mg as needed, which is likely needed at night as that is when symptoms are the worst.

## 2023-03-30 NOTE — Assessment & Plan Note (Signed)
K+ 3.0 on labs from last month.  Potassium chloride solution was prescribed for improved tolerance and I recommended that she increase daily supplementation to 40 mEq. -Repeat BMP ordered today

## 2023-03-30 NOTE — Patient Instructions (Signed)
It was a pleasure to see you today.  Thank you for giving [place] the opportunity to be involved in your care.  Below is a brief recap of your visit and next steps.  We will plan to see you again in 3 months.  Summary Repeat labs  Start Repatha for high cholesterol Increase gabapentin to 600 mg three times daily and an additional 300 mg as needed

## 2023-03-31 LAB — BASIC METABOLIC PANEL
BUN/Creatinine Ratio: 8 — ABNORMAL LOW (ref 12–28)
BUN: 6 mg/dL — ABNORMAL LOW (ref 8–27)
CO2: 25 mmol/L (ref 20–29)
Calcium: 8.4 mg/dL — ABNORMAL LOW (ref 8.7–10.3)
Chloride: 89 mmol/L — ABNORMAL LOW (ref 96–106)
Creatinine, Ser: 0.71 mg/dL (ref 0.57–1.00)
Glucose: 173 mg/dL — ABNORMAL HIGH (ref 70–99)
Potassium: 3.4 mmol/L — ABNORMAL LOW (ref 3.5–5.2)
Sodium: 132 mmol/L — ABNORMAL LOW (ref 134–144)
eGFR: 97 mL/min/{1.73_m2} (ref >59)

## 2023-04-04 ENCOUNTER — Other Ambulatory Visit: Payer: Self-pay

## 2023-04-04 ENCOUNTER — Encounter: Payer: Self-pay | Admitting: Internal Medicine

## 2023-04-04 MED ORDER — VICTOZA 18 MG/3ML ~~LOC~~ SOPN: 1.8000 mg | PEN_INJECTOR | Freq: Every day | SUBCUTANEOUS | 0 refills | Status: DC

## 2023-04-10 ENCOUNTER — Encounter: Payer: Self-pay | Admitting: Gastroenterology

## 2023-04-12 DIAGNOSIS — H5203 Hypermetropia, bilateral: Secondary | ICD-10-CM | POA: Diagnosis not present

## 2023-04-13 ENCOUNTER — Other Ambulatory Visit: Payer: Self-pay | Admitting: Internal Medicine

## 2023-04-13 DIAGNOSIS — F331 Major depressive disorder, recurrent, moderate: Secondary | ICD-10-CM | POA: Diagnosis not present

## 2023-04-13 DIAGNOSIS — F1099 Alcohol use, unspecified with unspecified alcohol-induced disorder: Secondary | ICD-10-CM | POA: Diagnosis not present

## 2023-04-13 DIAGNOSIS — F17200 Nicotine dependence, unspecified, uncomplicated: Secondary | ICD-10-CM

## 2023-04-19 ENCOUNTER — Other Ambulatory Visit: Payer: Self-pay | Admitting: Internal Medicine

## 2023-04-19 DIAGNOSIS — K7030 Alcoholic cirrhosis of liver without ascites: Secondary | ICD-10-CM

## 2023-04-24 ENCOUNTER — Encounter: Payer: Self-pay | Admitting: Internal Medicine

## 2023-04-28 ENCOUNTER — Telehealth: Payer: Self-pay | Admitting: Internal Medicine

## 2023-04-28 NOTE — Telephone Encounter (Signed)
Pt called in regards to insurance claim. She said that was coded wrong for her last visit. CPT code.

## 2023-05-01 ENCOUNTER — Other Ambulatory Visit: Payer: Self-pay | Admitting: Nurse Practitioner

## 2023-05-02 ENCOUNTER — Encounter: Payer: Self-pay | Admitting: Internal Medicine

## 2023-05-02 ENCOUNTER — Ambulatory Visit (INDEPENDENT_AMBULATORY_CARE_PROVIDER_SITE_OTHER): Payer: Medicare HMO | Admitting: Internal Medicine

## 2023-05-02 VITALS — BP 116/71 | HR 106 | Ht 65.0 in | Wt 158.2 lb

## 2023-05-02 DIAGNOSIS — E876 Hypokalemia: Secondary | ICD-10-CM

## 2023-05-02 DIAGNOSIS — K7030 Alcoholic cirrhosis of liver without ascites: Secondary | ICD-10-CM | POA: Diagnosis not present

## 2023-05-02 DIAGNOSIS — R600 Localized edema: Secondary | ICD-10-CM

## 2023-05-02 NOTE — Progress Notes (Unsigned)
Acute Office Visit  Subjective:     Patient ID: Michelle Michael, female    DOB: [birth year], 62 y.o.   MRN: [id number]  Chief Complaint  Patient presents with   Edema    Follow up   Michelle Michael presents today for an acute visit for evaluation of bilateral lower extremity edema.  Her past medical history is significant for alcoholic cirrhosis of the liver.  She contacted our office last week (4/29) reporting pitting bilateral lower extremity edema.  I recommended increasing Lasix to 40 mg twice daily and continuing spironolactone 100 mg daily as previously prescribed.  Today she states that her symptoms have significantly improved.  She feels lighter and her lower extremity edema has essentially resolved.  Her weight is down 13 pounds since her last appointment.  She has no additional concerns to discuss today.  Review of Systems  Cardiovascular:  Positive for leg swelling.  All other systems reviewed and are negative.     Objective:    BP 116/71   Pulse (!) 106   Ht 5\' 5"  (1.651 m)   Wt 158 lb 3.2 oz (71.8 kg)   SpO2 95%   BMI 26.33 kg/m  BP Readings from Last 3 Encounters:  05/02/23 116/71  03/30/23 (!) 117/55  03/16/23 133/79   Physical Exam Constitutional:      General: She is not in acute distress.    Appearance: Normal appearance. She is obese. She is not toxic-appearing.  HENT:     Head: Normocephalic and atraumatic.     Right Ear: External ear normal.     Left Ear: External ear normal.     Nose: Nose normal. No congestion or rhinorrhea.     Mouth/Throat:     Mouth: Mucous membranes are moist.     Pharynx: Oropharynx is clear. No oropharyngeal exudate or posterior oropharyngeal erythema.  Eyes:     General: No scleral icterus.    Extraocular Movements: Extraocular movements intact.     Conjunctiva/sclera: Conjunctivae normal.     Pupils: Pupils are equal, round, and reactive to light.  Cardiovascular:     Rate and Rhythm: Normal rate and regular rhythm.      Pulses: Normal pulses.     Heart sounds: Normal heart sounds. No murmur heard.    No friction rub. No gallop.  Pulmonary:     Effort: Pulmonary effort is normal.     Breath sounds: Normal breath sounds. No wheezing, rhonchi or rales.  Abdominal:     General: Abdomen is flat. Bowel sounds are normal. There is no distension.     Palpations: Abdomen is soft.     Tenderness: There is no abdominal tenderness.  Musculoskeletal:        General: Swelling (Trace bilateral lower extremity edema) present. Normal range of motion.     Cervical back: Normal range of motion.     Right lower leg: Edema present.     Left lower leg: Edema present.  Lymphadenopathy:     Cervical: No cervical adenopathy.  Skin:    General: Skin is warm and dry.     Capillary Refill: Capillary refill takes less than 2 seconds.     Coloration: Skin is not jaundiced.  Neurological:     General: No focal deficit present.     Mental Status: She is alert and oriented to person, place, and time.  Psychiatric:        Mood and Affect: Mood normal.  Behavior: Behavior normal.       Assessment & Plan:   Problem List Items Addressed This Visit   None   No orders of the defined types were placed in this encounter.   No follow-ups on file.  Michelle Lade, MD

## 2023-05-02 NOTE — Telephone Encounter (Signed)
More details required to address patient concern.  Which date of service and with which provider? Did she speak with her insurance?  Thank you

## 2023-05-02 NOTE — Patient Instructions (Signed)
It was a pleasure to see you today.  Thank you for giving [place] the opportunity to be involved in your care.  Below is a brief recap of your visit and next steps.  We will plan to see you again in July.  Summary Continue lasix 80 mg daily / spironolactone 100 mg daily for now GI follow up next week Check labs today Repeat echocardiogram ordered

## 2023-05-03 ENCOUNTER — Encounter: Payer: Self-pay | Admitting: Internal Medicine

## 2023-05-03 ENCOUNTER — Other Ambulatory Visit: Payer: Self-pay | Admitting: Internal Medicine

## 2023-05-03 LAB — CBC WITH DIFFERENTIAL/PLATELET
Basophils Absolute: 0.1 10*3/uL (ref 0.0–0.2)
Basos: 1 %
EOS (ABSOLUTE): 0.1 10*3/uL (ref 0.0–0.4)
Eos: 1 %
Hematocrit: 38.2 % (ref 34.0–46.6)
Hemoglobin: 13.8 g/dL (ref 11.1–15.9)
Immature Grans (Abs): 0 10*3/uL (ref 0.0–0.1)
Immature Granulocytes: 0 %
Lymphocytes Absolute: 2.6 10*3/uL (ref 0.7–3.1)
Lymphs: 25 %
MCH: 35.1 pg — ABNORMAL HIGH (ref 26.6–33.0)
MCHC: 36.1 g/dL — ABNORMAL HIGH (ref 31.5–35.7)
MCV: 97 fL (ref 79–97)
Monocytes Absolute: 0.9 10*3/uL (ref 0.1–0.9)
Monocytes: 8 %
Neutrophils Absolute: 6.7 10*3/uL (ref 1.4–7.0)
Neutrophils: 65 %
Platelets: 204 10*3/uL (ref 150–450)
RBC: 3.93 x10E6/uL (ref 3.77–5.28)
RDW: 12.1 % (ref 11.7–15.4)
WBC: 10.3 10*3/uL (ref 3.4–10.8)

## 2023-05-03 LAB — CMP14+EGFR
ALT: 17 IU/L (ref 0–32)
AST: 69 IU/L — ABNORMAL HIGH (ref 0–40)
Albumin/Globulin Ratio: 1.1 — ABNORMAL LOW (ref 1.2–2.2)
Albumin: 3.5 g/dL — ABNORMAL LOW (ref 3.9–4.9)
Alkaline Phosphatase: 288 IU/L — ABNORMAL HIGH (ref 44–121)
BUN/Creatinine Ratio: 8 — ABNORMAL LOW (ref 12–28)
BUN: 6 mg/dL — ABNORMAL LOW (ref 8–27)
Bilirubin Total: 1.9 mg/dL — ABNORMAL HIGH (ref 0.0–1.2)
CO2: 22 mmol/L (ref 20–29)
Calcium: 9 mg/dL (ref 8.7–10.3)
Chloride: 89 mmol/L — ABNORMAL LOW (ref 96–106)
Creatinine, Ser: 0.75 mg/dL (ref 0.57–1.00)
Globulin, Total: 3.1 g/dL (ref 1.5–4.5)
Glucose: 145 mg/dL — ABNORMAL HIGH (ref 70–99)
Potassium: 2.8 mmol/L — ABNORMAL LOW (ref 3.5–5.2)
Sodium: 133 mmol/L — ABNORMAL LOW (ref 134–144)
Total Protein: 6.6 g/dL (ref 6.0–8.5)
eGFR: 91 mL/min/{1.73_m2} (ref >59)

## 2023-05-03 LAB — PROTIME-INR
INR: 1.2 (ref 0.9–1.2)
Prothrombin Time: 13 s — ABNORMAL HIGH (ref 9.1–12.0)

## 2023-05-03 LAB — MAGNESIUM: Magnesium: 1.2 mg/dL — ABNORMAL LOW (ref 1.6–2.3)

## 2023-05-03 MED ORDER — MAGNESIUM CHLORIDE 64 MG PO TBEC: 2.0000 | DELAYED_RELEASE_TABLET | Freq: Two times a day (BID) | ORAL | 0 refills | Status: AC

## 2023-05-03 NOTE — Assessment & Plan Note (Signed)
Daily supplementation recently increased to 40 mEq. -Repeat CMP ordered today

## 2023-05-03 NOTE — Assessment & Plan Note (Signed)
Presenting today for evaluation of bilateral lower extremity edema.  Edema and symptoms have significantly improved with increasing Lasix to 40 mg twice daily.  Her weight is down 13 pounds since her last appointment.  She endorses improvement in her respiratory status as edema has resolved.  Likely due to cirrhosis, but must consider additional etiologies. -Repeat MELD labs ordered today -Repeat TTE.  Last completed in July 2020 at [facility].  Hyperdynamic EF, but otherwise grossly normal. -Recommend continuing Lasix 40 mg twice daily for now. -GI follow-up scheduled for next week

## 2023-05-05 NOTE — Telephone Encounter (Signed)
Pt did contact insurance. She stated the visit was coded wrong and medicare was refusing to pay for the service. Pt was last seen on 05/07 for visit, but this claim in question was on 03/30/2023. That's all the info I can provide.   Thanks!

## 2023-05-09 ENCOUNTER — Other Ambulatory Visit: Payer: Self-pay | Admitting: Internal Medicine

## 2023-05-09 DIAGNOSIS — F17200 Nicotine dependence, unspecified, uncomplicated: Secondary | ICD-10-CM

## 2023-05-09 DIAGNOSIS — J432 Centrilobular emphysema: Secondary | ICD-10-CM

## 2023-05-10 NOTE — Progress Notes (Unsigned)
GI Office Note    Referring Provider: Billie Lade, MD Primary Care Physician:  Billie Lade, MD Primary Gastroenterologist: Hennie Duos. Marletta Lor, DO  Date:  05/11/2023  ID:  Irving Shows, DOB May 11, 1961, MRN [id number]   Chief Complaint   Chief Complaint  Patient presents with   Follow-up    Follow up on alcoholic cirrhosis   History of Present Illness  Sheela Mall is a 62 y.o. female with a history of anemia, CAD depression, type 2 diabetes, GERD, HTN, alcoholic cirrhosis presenting today for follow-up of cirrhosis.  Colonoscopy February 2021: 2 polyps in the sigmoid colon, tubular adenomas.    EGD in February 2021: Nonbleeding cratered duodenal ulcer without stigmata of bleeding and second portion of duodenum, gastritis with moderate chronic inactive gastritis and intestinal metaplasia, negative H. 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   Prior cirrhosis workup revealed negative AMA, ANA, viral hepatitis.  ASMA slightly elevated 22, likely secondary to alcohol use.  aFP in November 2021 4.6  Last office visit 11/29/22.  MELD score from June 2023 was 10.  Denied any abdominal distention, hematemesis, confusion.  Was maintained on Lasix daily and spironolactone 100 mg daily without any bilateral lower extremity edema.  Having 2-3 bowel movements daily.  Admitted to some intermittent alcohol consumption since her last visit.  Having occasional nausea and uses Zofran sparingly.  Denied any jaundice or dysphagia.  MELD labs ordered.  Advised AFP in 6 months.  Scheduled for EGD for esophageal variceal surveillance.  No changes in medications.  Advised continue 2 g sodium diet.  Repeat colonoscopy in 2026.  Follow-up in 6 months.  Right upper quadrant ultrasound 12/16/2022: -Reversal of blood flow in the portal vein consistent with portal venous hypertension -Cirrhotic morphology of liver -No liver lesion  EGD 02/10/23: -No  evidence of varices -grade B reflux esophagitis -non severe candidiasis esophagitis, KOH prep negative. -gastritis s/p biopsy (reactive gastropathy with ectatic plan propria capillaries consistent with portal hypertensive gastropathy), negative H. pylori. -normal duodenum -use PPI BID -repeat EGD in 2 years  Today: Cirrhosis history Hematemesis/coffee ground emesis: None History of variceal bleeding: None Abdominal pain: None Abdominal distention/worsening ascites: none Peripheral swelling:  Fever/chills: None Episodes of confusion/disorientation: none Number of daily bowel movements: usually 2 BM daily, not hard or loose.  Taking diuretics?: Taking lasix 40 mg BID and spironolactone 100 mg BID Date of last EGD: 02/10/2023 with reflux esophagitis, Candida esophagitis, and gastritis.  No varices Prior history of banding?:  No Prior episodes of SBP: No Last time liver imaging was performed: 12/16/2022  Denies any chest pain or shortness of breath.  Hepatitis A and B vaccination status: Per review of immunization report, she received 1 hepatitis A vaccine in November 2021.   MELD 3.0: 15 at 05/02/2023  9:44 AM MELD-Na: 11 at 05/02/2023  9:44 AM Calculated from: Serum Creatinine: 0.75 mg/dL (Using min of 1 mg/dL) at 4/0/9811  9:14 AM Serum Sodium: 133 mmol/L at 05/02/2023  9:44 AM Total Bilirubin: 1.9 mg/dL at 07/03/2955  2:13 AM Serum Albumin: 3.5 g/dL at 0/07/6577  4:69 AM INR(ratio): 1.2 at 05/02/2023  9:44 AM Age at listing (hypothetical): 61 years Sex: Female at 05/02/2023  9:44 AM  Was having some swelling in her lower extremities and she contacted her PCP and he increased her lasix to 40 mg BID and then increased her potassium supplement and is due to get her labs checked again next week. Since the  increase in lasix she has significant reduction in her swelling. When she was having swelling she was using a cane but since the swelling has reduced she is feeling a lot better and not needing  the cane. 2 months ago she was getting short of breath and now she is not.   Gets nauseated sometimes with taking her morning meds.   Not adhering to a 2g sodium diet, is working on eating more green foods/veggies. In the last month she has discovered that she likes cucumbers. Will use lettuce for wraps with a meat.   Drinks about 6 oz glass of wine 2-3 times per week with stressful events.  No hard liquor.   Has been on repatha and that seems to be increasing her sugars some. Continue to follow with endocrinology and sees her next month.   Wt Readings from Last 3 Encounters:  05/11/23 157 lb 6.4 oz (71.4 kg)  05/02/23 158 lb 3.2 oz (71.8 kg)  03/30/23 171 lb 3.2 oz (77.7 kg)   Current Outpatient Medications  Medication Sig Dispense Refill   busPIRone (BUSPAR) 10 MG tablet Take 10 mg by mouth 3 (three) times daily.     Continuous Blood Gluc Receiver (DEXCOM G6 RECEIVER) DEVI by Does not apply route.     Continuous Blood Gluc Sensor (DEXCOM G6 SENSOR) MISC Apply 1 sensor every 10 days. 9 each 1   Continuous Blood Gluc Transmit (DEXCOM G6 TRANSMITTER) MISC Use transmitter every 90 days. 1 each 1   DULoxetine (CYMBALTA) 30 MG capsule Take 30 mg by mouth every morning.     Evolocumab (REPATHA) 140 MG/ML SOSY Inject 140 mg into the skin every 14 (fourteen) days. 2.1 mL 1   fluticasone (FLONASE) 50 MCG/ACT nasal spray Place 2 sprays into both nostrils daily. (Patient taking differently: Place 2 sprays into both nostrils daily as needed for allergies.) 16 mL 2   folic acid (FOLVITE) 400 MCG tablet Take 400 mcg by mouth daily.     furosemide (LASIX) 40 MG tablet Take 40 mg by mouth daily.     gabapentin (NEURONTIN) 300 MG capsule Take 1 capsule (300 mg total) by mouth daily as needed (nerve pain). 90 capsule 1   gabapentin (NEURONTIN) 600 MG tablet Take 1 tablet (600 mg total) by mouth 3 (three) times daily. 180 tablet 1   Glucagon, rDNA, (GLUCAGON EMERGENCY) 1 MG KIT Inject 1 mg into the muscle  as needed (low blood sugar).     insulin isophane & regular human KwikPen (NOVOLIN 70/30 KWIKPEN) (70-30) 100 UNIT/ML KwikPen Inject 5-8 Units into the skin 2 (two) times daily with a meal. Pt taking 8-10 in the am and 5-8 at night (Patient taking differently: Inject 5-8 Units into the skin 2 (two) times daily with a meal.) 15 mL 3   Insulin Pen Needle (BD PEN NEEDLE NANO 2ND GEN) 32G X 4 MM MISC Use to inject insulin and Victoza for 3 injections per day 1000 each 6   INSULIN SYRINGE .5CC/29G 29G X 1/2" 0.5 ML MISC Use to inject insulin twice daily. 100 each 3   magnesium chloride (SLOW-MAG) 64 MG TBEC SR tablet Take 2 tablets (128 mg total) by mouth 2 (two) times daily for 14 days. 56 tablet 0   metFORMIN (GLUCOPHAGE-XR) 500 MG 24 hr tablet Take 1 tablet (500 mg total) by mouth daily with breakfast. (Patient taking differently: Take 500 mg by mouth daily with lunch.) 90 tablet 3   ondansetron (ZOFRAN) 4 MG tablet TAKE  1 TABLET BY MOUTH EVERY 8 HOURS AS NEEDED 20 tablet 0   pantoprazole (PROTONIX) 40 MG tablet Take 1 tablet (40 mg total) by mouth 2 (two) times daily. 60 tablet 11   Potassium Chloride 40 MEQ/15ML (20%) SOLN Take 20 mEq by mouth daily. 473 mL 1   spironolactone (ALDACTONE) 100 MG tablet Take 100 mg by mouth daily.     STIOLTO RESPIMAT 2.5-2.5 MCG/ACT AERS INHALE 2 PUFFS BY MOUTH ONCE DAILY 4 g 0   traZODone (DESYREL) 50 MG tablet Take 1 tablet (50 mg total) by mouth at bedtime as needed for sleep. 30 tablet 0   VENTOLIN HFA 108 (90 Base) MCG/ACT inhaler INHALE 2 PUFFS BY MOUTH EVERY 6 HOURS AS NEEDED FOR WHEEZING OR SHORTNESS OF BREATH 18 g 0   VICTOZA 18 MG/3ML SOPN INJECT 1.8 MG INTO THE SKIN DAILY 27 mL 0   cholecalciferol (VITAMIN D3) 25 MCG (1000 UNIT) tablet Take 1,000 Units by mouth daily.     Vitamin D, Ergocalciferol, (DRISDOL) 1.25 MG (50000 UNIT) CAPS capsule Take 1 capsule (50,000 Units total) by mouth every 7 (seven) days. 12 capsule 1   No current facility-administered  medications for this visit.    Past Medical History:  Diagnosis Date   Anemia    Anxiety    Centrilobular emphysema (HCC) 03/22/2023   Cirrhosis (HCC)    CKD (chronic kidney disease)    Depression    Diabetes mellitus, type II (HCC)    GERD (gastroesophageal reflux disease)    Hyperlipidemia    Hypertension    Hypokalemia    Hyponatremia    Neuromuscular disorder (HCC) 2021   Substance abuse (HCC)    Years ago   Ulcer 1980s    Past Surgical History:  Procedure Laterality Date   BIOPSY  02/10/2023   Procedure: BIOPSY;  Surgeon: Lanelle Bal, DO;  Location: [facility];  Service: Endoscopy;;   CESAREAN SECTION  1984   CHOLECYSTECTOMY     ESOPHAGEAL BRUSHING  02/10/2023   Procedure: ESOPHAGEAL BRUSHING;  Surgeon: Lanelle Bal, DO;  Location: [facility];  Service: Endoscopy;;   ESOPHAGOGASTRODUODENOSCOPY (EGD) WITH PROPOFOL N/A 02/10/2023   Procedure: ESOPHAGOGASTRODUODENOSCOPY (EGD) WITH PROPOFOL;  Surgeon: Lanelle Bal, DO;  Location: [facility];  Service: Endoscopy;  Laterality: N/A;  8:45 am   FRACTURE SURGERY  2005   ORTHOPEDIC SURGERY     SPINE SURGERY  2003   TUBAL LIGATION  1993    Family History  Problem Relation Age of Onset   Diabetes Mother    Hypertension Mother    Alcohol abuse Mother    Cancer Mother    Hypertension Father    Arthritis Father    Heart disease Father    Cancer Daughter    Obesity Daughter    Drug abuse Son    Breast cancer Neg Hx     Allergies as of 05/11/2023   (No Known Allergies)    Social History   Socioeconomic History   Marital status: Divorced    Spouse name: Not on file   Number of children: 2   Years of education: 12   Highest education level: Associate degree: occupational, Scientist, product/process development, or vocational program  Occupational History   Not on file  Tobacco Use   Smoking status: Every Day    Packs/day: 1.00    Years: 40.00    Additional pack years: 0.00    Total pack years: 40.00    Types:  Cigarettes  Passive exposure: Current   Smokeless tobacco: Never   Tobacco comments:    Smoking Cessation Classes Offered.  Vaping Use   Vaping Use: Former  Substance and Sexual Activity   Alcohol use: Yes    Alcohol/week: 10.0 standard drinks of alcohol    Types: 10 Glasses of wine per week    Comment: occassional wine- 2-3 times weekly   Drug use: Never   Sexual activity: Not Currently    Partners: Male    Birth control/protection: Abstinence, Post-menopausal, None  Other Topics Concern   Not on file  Social History Narrative   Lives with her room mate    Social Determinants of Health   Financial Resource Strain: Low Risk  (03/29/2023)   Overall Financial Resource Strain (CARDIA)    Difficulty of Paying Living Expenses: Not hard at all  Food Insecurity: No Food Insecurity (03/29/2023)   Hunger Vital Sign    Worried About Running Out of Food in the Last Year: Never true    Ran Out of Food in the Last Year: Never true  Transportation Needs: Unmet Transportation Needs (03/29/2023)   PRAPARE - Transportation    Lack of Transportation (Medical): Yes    Lack of Transportation (Non-Medical): Yes  Physical Activity: Inactive (01/16/2023)   Exercise Vital Sign    Days of Exercise per Week: 0 days    Minutes of Exercise per Session: 0 min  Stress: Stress Concern Present (01/16/2023)   [employer] of Occupational Health - Occupational Stress Questionnaire    Feeling of Stress : Rather much  Social Connections: Socially Isolated (03/29/2023)   Social Connection and Isolation Panel [NHANES]    Frequency of Communication with Friends and Family: Once a week    Frequency of Social Gatherings with Friends and Family: Once a week    Attends Religious Services: Never    Database administrator or Organizations: No    Attends Engineer, structural: Never    Marital Status: Divorced   Review of Systems   Gen: Denies fever, chills, anorexia. Denies fatigue, weakness, weight  loss.  CV: Denies chest pain, palpitations, syncope, peripheral edema, and claudication. Resp: Denies dyspnea at rest, cough, wheezing, coughing up blood, and pleurisy. GI: See HPI Derm: Denies rash, itching, dry skin Psych: Denies depression, anxiety, memory loss, confusion. No homicidal or suicidal ideation.  Heme: Denies bruising, bleeding, and enlarged lymph nodes.  Physical Exam   BP 105/67   Pulse (!) 102   Temp 97.8 F (36.6 C)   Ht 5\' 5"  (1.651 m)   Wt 157 lb 6.4 oz (71.4 kg)   BMI 26.19 kg/m   General:   Alert and oriented. No distress noted. Pleasant and cooperative.  Head:  Normocephalic and atraumatic. Eyes:  Conjuctiva clear without scleral icterus. Mouth:  Oral mucosa pink and moist. Good dentition. No lesions. Lungs:  Clear to auscultation bilaterally. No wheezes, rales, or rhonchi. No distress.  Heart:  S1, S2 present without murmurs appreciated.  Abdomen:  +BS, soft, non-tender and non-distended. No rebound or guarding. Mild hepatomegaly present.  Rectal: deferred Msk:  Symmetrical without gross deformities. Normal posture. Extremities:  Without edema. Neurologic:  Alert and  oriented x4 Psych:  Alert and cooperative. Normal mood and affect.   Assessment  Adisynn Arostegui is a 62 y.o. female with a history of anemia, CAD depression, type 2 diabetes, GERD, HTN, alcoholic cirrhosis presenting today for follow-up of cirrhosis.  Alcoholic Cirrhosis: Continues to have about 2-3 glasses of wine  a week on average.  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  Recent increase in peripheral edema as well as shortness of breath however PCP increased her Lasix to 40 mg twice daily and continued on spironolactone 100 mg daily and she has had resolution of her edema and no longer has complaint of shortness of breath.  Not adhering to 2 g sodium diet.  EGD recently updated without evidence of varices but with signs of portal hypertensive gastropathy.   MELD score stable at 15 where she was in December.  Most recent LFTs with AST 69, ALT 17, T. bili 1.9, alk phos 288.  Due for right upper quadrant ultrasound next month which we will go ahead and schedule. Will plan to repeat MELD labs with AFP in 6 months.  Stressed again the importance of alcohol cessation and adhering to a 2 g sodium diet to prevent further progression of liver disease.  GERD, reflux esophagitis: Recent EGD with reflux esophagitis and concern for Candida esophagitis however KOH prep negative.  Was advised to increase pantoprazole to 40 mg twice daily.  Will continue with current plan.  PLAN   RUQ [place] next month and 6 months from now.  CBC, CMP, INR, and AFP in 6 months Continue Lasix 40 mg twice daily Continue spironolactone 100 mg twice daily Continue pantoprazole 40 mg twice daily Alcohol cessation 2g Sodium diet Follow up in 6 months.     Brooke Bonito, MSN, FNP-BC, AGACNP-BC [facility]

## 2023-05-11 ENCOUNTER — Ambulatory Visit (INDEPENDENT_AMBULATORY_CARE_PROVIDER_SITE_OTHER): Payer: Medicare HMO | Admitting: Gastroenterology

## 2023-05-11 ENCOUNTER — Telehealth: Payer: Self-pay | Admitting: *Deleted

## 2023-05-11 ENCOUNTER — Encounter: Payer: Self-pay | Admitting: Gastroenterology

## 2023-05-11 ENCOUNTER — Other Ambulatory Visit: Payer: Self-pay | Admitting: *Deleted

## 2023-05-11 ENCOUNTER — Encounter: Payer: Self-pay | Admitting: *Deleted

## 2023-05-11 VITALS — BP 105/67 | HR 102 | Temp 97.8°F | Ht 65.0 in | Wt 157.4 lb

## 2023-05-11 DIAGNOSIS — K2100 Gastro-esophageal reflux disease with esophagitis, without bleeding: Secondary | ICD-10-CM | POA: Diagnosis not present

## 2023-05-11 DIAGNOSIS — K219 Gastro-esophageal reflux disease without esophagitis: Secondary | ICD-10-CM

## 2023-05-11 DIAGNOSIS — K7030 Alcoholic cirrhosis of liver without ascites: Secondary | ICD-10-CM | POA: Diagnosis not present

## 2023-05-11 NOTE — Patient Instructions (Addendum)
Cirrhosis Lifestyle Recommendations:  High-protein diet from a primarily plant-based diet. Avoid red meat.  No raw or undercooked meat, seafood, or shellfish. Low-fat/cholesterol/carbohydrate diet. Limit sodium to no more than 2000 mg/day including everything that you eat and drink. Recommend at least 30 minutes of aerobic and resistance exercise 3 days/week. Limit Tylenol to 2000 mg daily.  Alcohol cessation  Continue Lasix 40 mg twice daily, spironolactone 100 mg once daily.  Continue pantoprazole 40 mg twice daily.  We will get you scheduled for an ultrasound of your liver for next month.  We will plan to repeat ultrasound and labs in 6 months as well as have you follow-up in the office.  Please not hesitate to reach out if you have any questions, concerns, or new symptoms develop or you experience worsening edema in your lower extremities or significant increase in weight.  I hope you enjoy your summer!  It was a pleasure to see you today. I want to create trusting relationships with patients. If you receive a survey regarding your visit,  I greatly appreciate you taking time to fill this out on paper or through your MyChart. I value your feedback.  Brooke Bonito, MSN, FNP-BC, AGACNP-BC [facility]

## 2023-05-11 NOTE — Telephone Encounter (Signed)
LMOVM detailed message of [place] scheduled for 06/08/22, arrive at 10:00 am to check in, NPO after midnight. Any questions to give me a call.

## 2023-05-12 DIAGNOSIS — F1099 Alcohol use, unspecified with unspecified alcohol-induced disorder: Secondary | ICD-10-CM | POA: Diagnosis not present

## 2023-05-12 DIAGNOSIS — F331 Major depressive disorder, recurrent, moderate: Secondary | ICD-10-CM | POA: Diagnosis not present

## 2023-05-16 ENCOUNTER — Other Ambulatory Visit: Payer: Self-pay

## 2023-05-16 ENCOUNTER — Telehealth: Payer: Self-pay | Admitting: Nurse Practitioner

## 2023-05-16 ENCOUNTER — Encounter: Payer: Self-pay | Admitting: Internal Medicine

## 2023-05-16 MED ORDER — FUROSEMIDE 40 MG PO TABS: 40.0000 mg | ORAL_TABLET | Freq: Two times a day (BID) | ORAL | 0 refills | Status: DC

## 2023-05-16 MED ORDER — DEXCOM G7 RECEIVER DEVI: 1.0000 | Freq: Once | 0 refills | Status: AC

## 2023-05-16 MED ORDER — DEXCOM G7 SENSOR MISC: 1.0000 | 3 refills | Status: DC

## 2023-05-16 NOTE — Telephone Encounter (Signed)
I sent in for the G7 sensors and receiver to [facility] for her.

## 2023-05-16 NOTE — Telephone Encounter (Signed)
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 They asked her if you wanted to switch her to a G7, if so, send in all new stuff. Thanks

## 2023-05-18 ENCOUNTER — Other Ambulatory Visit: Payer: Self-pay

## 2023-05-18 ENCOUNTER — Telehealth: Payer: Self-pay

## 2023-05-18 DIAGNOSIS — R600 Localized edema: Secondary | ICD-10-CM

## 2023-05-18 DIAGNOSIS — E876 Hypokalemia: Secondary | ICD-10-CM

## 2023-05-18 NOTE — Telephone Encounter (Signed)
Requesting office called in today to get pt's echo scheduled. She is scheduled for 06/21/23. I was told to send a message to you all about the prior auth.

## 2023-05-19 ENCOUNTER — Other Ambulatory Visit: Payer: Self-pay | Admitting: Internal Medicine

## 2023-05-19 ENCOUNTER — Encounter: Payer: Self-pay | Admitting: Internal Medicine

## 2023-05-19 LAB — BASIC METABOLIC PANEL
BUN/Creatinine Ratio: 8 — ABNORMAL LOW (ref 12–28)
BUN: 6 mg/dL — ABNORMAL LOW (ref 8–27)
CO2: 23 mmol/L (ref 20–29)
Calcium: 9.3 mg/dL (ref 8.7–10.3)
Chloride: 93 mmol/L — ABNORMAL LOW (ref 96–106)
Creatinine, Ser: 0.72 mg/dL (ref 0.57–1.00)
Glucose: 109 mg/dL — ABNORMAL HIGH (ref 70–99)
Potassium: 4.3 mmol/L (ref 3.5–5.2)
Sodium: 132 mmol/L — ABNORMAL LOW (ref 134–144)
eGFR: 95 mL/min/{1.73_m2} (ref >59)

## 2023-05-19 LAB — MAGNESIUM: Magnesium: 1.3 mg/dL — ABNORMAL LOW (ref 1.6–2.3)

## 2023-05-19 MED ORDER — SLOW MAGNESIUM/CALCIUM 64-106 MG PO TBEC: 2.0000 | DELAYED_RELEASE_TABLET | Freq: Two times a day (BID) | ORAL | 1 refills | Status: AC

## 2023-05-25 ENCOUNTER — Other Ambulatory Visit: Payer: Self-pay | Admitting: Internal Medicine

## 2023-05-25 DIAGNOSIS — K7030 Alcoholic cirrhosis of liver without ascites: Secondary | ICD-10-CM

## 2023-05-29 ENCOUNTER — Ambulatory Visit (INDEPENDENT_AMBULATORY_CARE_PROVIDER_SITE_OTHER): Payer: Medicare HMO | Admitting: Nurse Practitioner

## 2023-05-29 ENCOUNTER — Other Ambulatory Visit: Payer: Self-pay | Admitting: Internal Medicine

## 2023-05-29 ENCOUNTER — Encounter: Payer: Self-pay | Admitting: Nurse Practitioner

## 2023-05-29 VITALS — BP 103/68 | HR 103 | Ht 65.0 in | Wt 160.8 lb

## 2023-05-29 DIAGNOSIS — E119 Type 2 diabetes mellitus without complications: Secondary | ICD-10-CM | POA: Diagnosis not present

## 2023-05-29 DIAGNOSIS — Z5309 Procedure and treatment not carried out because of other contraindication: Secondary | ICD-10-CM

## 2023-05-29 DIAGNOSIS — E559 Vitamin D deficiency, unspecified: Secondary | ICD-10-CM

## 2023-05-29 DIAGNOSIS — Z7984 Long term (current) use of oral hypoglycemic drugs: Secondary | ICD-10-CM | POA: Diagnosis not present

## 2023-05-29 DIAGNOSIS — Z794 Long term (current) use of insulin: Secondary | ICD-10-CM | POA: Diagnosis not present

## 2023-05-29 DIAGNOSIS — E782 Mixed hyperlipidemia: Secondary | ICD-10-CM

## 2023-05-29 DIAGNOSIS — Z7985 Long-term (current) use of injectable non-insulin antidiabetic drugs: Secondary | ICD-10-CM | POA: Diagnosis not present

## 2023-05-29 DIAGNOSIS — Z9189 Other specified personal risk factors, not elsewhere classified: Secondary | ICD-10-CM

## 2023-05-29 DIAGNOSIS — E785 Hyperlipidemia, unspecified: Secondary | ICD-10-CM

## 2023-05-29 DIAGNOSIS — I10 Essential (primary) hypertension: Secondary | ICD-10-CM

## 2023-05-29 MED ORDER — VICTOZA 18 MG/3ML ~~LOC~~ SOPN: 1.8000 mg | PEN_INJECTOR | Freq: Every day | SUBCUTANEOUS | 3 refills | Status: DC

## 2023-05-29 NOTE — Progress Notes (Signed)
Endocrinology Follow Up Note       05/29/2023, 10:48 AM   Subjective:    Patient ID: Michelle Michael, female    DOB: [birth year].  Michelle Michael is being seen in follow up after being in seen in consultation for management of currently uncontrolled symptomatic diabetes requested by  Billie Lade, MD.   Past Medical History:  Diagnosis Date   Anemia    Anxiety    Centrilobular emphysema (HCC) 03/22/2023   Cirrhosis (HCC)    CKD (chronic kidney disease)    Depression    Diabetes mellitus, type II (HCC)    GERD (gastroesophageal reflux disease)    Hyperlipidemia    Hypertension    Hypokalemia    Hyponatremia    Neuromuscular disorder (HCC) 2021   Substance abuse (HCC)    Years ago   Ulcer 1980s    Past Surgical History:  Procedure Laterality Date   BIOPSY  02/10/2023   Procedure: BIOPSY;  Surgeon: Lanelle Bal, DO;  Location: [facility];  Service: Endoscopy;;   CESAREAN SECTION  1984   CHOLECYSTECTOMY     ESOPHAGEAL BRUSHING  02/10/2023   Procedure: ESOPHAGEAL BRUSHING;  Surgeon: Lanelle Bal, DO;  Location: [facility];  Service: Endoscopy;;   ESOPHAGOGASTRODUODENOSCOPY (EGD) WITH PROPOFOL N/A 02/10/2023   Procedure: ESOPHAGOGASTRODUODENOSCOPY (EGD) WITH PROPOFOL;  Surgeon: Lanelle Bal, DO;  Location: [facility];  Service: Endoscopy;  Laterality: N/A;  8:45 am   FRACTURE SURGERY  2005   ORTHOPEDIC SURGERY     SPINE SURGERY  2003   TUBAL LIGATION  1993    Social History   Socioeconomic History   Marital status: Divorced    Spouse name: Not on file   Number of children: 2   Years of education: 12   Highest education level: Associate degree: occupational, Scientist, product/process development, or vocational program  Occupational History   Not on file  Tobacco Use   Smoking status: Every Day    Packs/day: 1.00    Years: 40.00    Additional pack years: 0.00    Total pack years: 40.00    Types:  Cigarettes    Passive exposure: Current   Smokeless tobacco: Never   Tobacco comments:    Smoking Cessation Classes Offered.  Vaping Use   Vaping Use: Former  Substance and Sexual Activity   Alcohol use: Yes    Alcohol/week: 10.0 standard drinks of alcohol    Types: 10 Glasses of wine per week    Comment: occassional wine- 2-3 times weekly   Drug use: Never   Sexual activity: Not Currently    Partners: Male    Birth control/protection: Abstinence, Post-menopausal, None  Other Topics Concern   Not on file  Social History Narrative   Lives with her room mate    Social Determinants of Health   Financial Resource Strain: Low Risk  (03/29/2023)   Overall Financial Resource Strain (CARDIA)    Difficulty of Paying Living Expenses: Not hard at all  Food Insecurity: No Food Insecurity (03/29/2023)   Hunger Vital Sign    Worried About Running Out of Food in the Last Year: Never true    Ran Out  of Food in the Last Year: Never true  Transportation Needs: Unmet Transportation Needs (03/29/2023)   PRAPARE - Transportation    Lack of Transportation (Medical): Yes    Lack of Transportation (Non-Medical): Yes  Physical Activity: Inactive (01/16/2023)   Exercise Vital Sign    Days of Exercise per Week: 0 days    Minutes of Exercise per Session: 0 min  Stress: Stress Concern Present (01/16/2023)   [employer] of Occupational Health - Occupational Stress Questionnaire    Feeling of Stress : Rather much  Social Connections: Socially Isolated (03/29/2023)   Social Connection and Isolation Panel [NHANES]    Frequency of Communication with Friends and Family: Once a week    Frequency of Social Gatherings with Friends and Family: Once a week    Attends Religious Services: Never    Database administrator or Organizations: No    Attends Engineer, structural: Never    Marital Status: Divorced    Family History  Problem Relation Age of Onset   Diabetes Mother    Hypertension Mother     Alcohol abuse Mother    Cancer Mother    Hypertension Father    Arthritis Father    Heart disease Father    Cancer Daughter    Obesity Daughter    Drug abuse Son    Breast cancer Neg Hx     Outpatient Encounter Medications as of 05/29/2023  Medication Sig   busPIRone (BUSPAR) 10 MG tablet Take 10 mg by mouth 3 (three) times daily.   Continuous Blood Gluc Transmit (DEXCOM G6 TRANSMITTER) MISC Use transmitter every 90 days.   Continuous Glucose Sensor (DEXCOM G7 SENSOR) MISC Inject 1 Application into the skin as directed. Change sensor every 10 days as directed.   DULoxetine (CYMBALTA) 30 MG capsule Take 30 mg by mouth every morning.   fluticasone (FLONASE) 50 MCG/ACT nasal spray Place 2 sprays into both nostrils daily. (Patient taking differently: Place 2 sprays into both nostrils daily as needed for allergies.)   folic acid (FOLVITE) 400 MCG tablet Take 400 mcg by mouth daily.   furosemide (LASIX) 40 MG tablet Take 1 tablet (40 mg total) by mouth 2 (two) times daily.   gabapentin (NEURONTIN) 300 MG capsule Take 1 capsule (300 mg total) by mouth daily as needed (nerve pain).   gabapentin (NEURONTIN) 600 MG tablet Take 1 tablet (600 mg total) by mouth 3 (three) times daily.   Glucagon, rDNA, (GLUCAGON EMERGENCY) 1 MG KIT Inject 1 mg into the muscle as needed (low blood sugar).   Insulin Pen Needle (BD PEN NEEDLE NANO 2ND GEN) 32G X 4 MM MISC Use to inject insulin and Victoza for 3 injections per day   INSULIN SYRINGE .5CC/29G 29G X 1/2" 0.5 ML MISC Use to inject insulin twice daily.   Magnesium Chloride-Calcium (SLOW MAGNESIUM/CALCIUM) 64-106 MG TBEC Take 2 tablets by mouth in the morning and at bedtime.   metFORMIN (GLUCOPHAGE-XR) 500 MG 24 hr tablet Take 1 tablet (500 mg total) by mouth daily with breakfast. (Patient taking differently: Take 500 mg by mouth daily with lunch.)   ondansetron (ZOFRAN) 4 MG tablet TAKE 1 TABLET BY MOUTH EVERY 8 HOURS AS NEEDED   pantoprazole (PROTONIX) 40 MG  tablet Take 1 tablet (40 mg total) by mouth 2 (two) times daily.   Potassium Chloride 40 MEQ/15ML (20%) SOLN Take 20 mEq by mouth daily.   spironolactone (ALDACTONE) 100 MG tablet Take 100 mg by mouth daily.  STIOLTO RESPIMAT 2.5-2.5 MCG/ACT AERS INHALE 2 PUFFS BY MOUTH ONCE DAILY   traZODone (DESYREL) 50 MG tablet Take 1 tablet (50 mg total) by mouth at bedtime as needed for sleep.   VENTOLIN HFA 108 (90 Base) MCG/ACT inhaler INHALE 2 PUFFS BY MOUTH EVERY 6 HOURS AS NEEDED FOR WHEEZING OR SHORTNESS OF BREATH   [DISCONTINUED] Evolocumab (REPATHA) 140 MG/ML SOSY Inject 140 mg into the skin every 14 (fourteen) days.   [DISCONTINUED] insulin isophane & regular human KwikPen (NOVOLIN 70/30 KWIKPEN) (70-30) 100 UNIT/ML KwikPen Inject 5-8 Units into the skin 2 (two) times daily with a meal. Pt taking 8-10 in the am and 5-8 at night (Patient taking differently: Inject 5-8 Units into the skin 2 (two) times daily with a meal.)   [DISCONTINUED] VICTOZA 18 MG/3ML SOPN INJECT 1.8 MG INTO THE SKIN DAILY   liraglutide (VICTOZA) 18 MG/3ML SOPN Inject 1.8 mg into the skin daily.   [DISCONTINUED] ondansetron (ZOFRAN) 4 MG tablet TAKE 1 TABLET BY MOUTH EVERY 8 HOURS AS NEEDED   No facility-administered encounter medications on file as of 05/29/2023.    ALLERGIES: No Known Allergies  VACCINATION STATUS: Immunization History  Administered Date(s) Administered   Hepatitis A, Adult 11/13/2020   Tdap 11/13/2020    Diabetes She presents for her follow-up diabetic visit. She has type 2 diabetes mellitus. Onset time: Diagnosed at approx age of 52. Her disease course has been improving. There are no hypoglycemic associated symptoms. Associated symptoms include fatigue and foot paresthesias. There are no hypoglycemic complications. Symptoms are stable. Diabetic complications include nephropathy and peripheral neuropathy. Risk factors for coronary artery disease include diabetes mellitus, dyslipidemia, family history,  hypertension, tobacco exposure and sedentary lifestyle. Current diabetic treatment includes oral agent (monotherapy) and insulin injections (and Victoza). She is compliant with treatment most of the time. Her weight is fluctuating minimally. She is following a generally healthy diet. When asked about meal planning, she reported none. She has not had a previous visit with a dietitian. She participates in exercise intermittently. Her home blood glucose trend is decreasing steadily. Her overall blood glucose range is 130-140 mg/dl. (She presents today with her CGM showing at target glycemic profile overall.  Her POCT A1c today is 5.4% improving from last visit of 5.7%.  Analysis of her CGM Michael TIR 84%, TAR 14%, TBR <3% with a GMI of 6.6%.  She denies significant hypoglycemia.  She does take less insulin that prescribed at times due to at goal readings.  She says she has been making healthy food choices most of the time.) An ACE inhibitor/angiotensin II receptor blocker is not being taken. She does not see a podiatrist.Eye exam is current.  Hypertension This is a chronic problem. The current episode started more than 1 year ago. The problem has been resolved since onset. The problem is controlled. There are no associated agents to hypertension. Risk factors for coronary artery disease include diabetes mellitus, dyslipidemia, smoking/tobacco exposure, family history and sedentary lifestyle. Past treatments include diuretics. The current treatment provides mild improvement. Compliance problems include diet, exercise and psychosocial issues.   Hyperlipidemia This is a chronic problem. The current episode started more than 1 year ago. The problem is uncontrolled. Recent lipid tests were reviewed and are high. Exacerbating diseases include diabetes. Factors aggravating her hyperlipidemia include smoking and fatty foods. She is currently on no antihyperlipidemic treatment. Compliance problems include adherence to diet,  adherence to exercise and psychosocial issues.  Risk factors for coronary artery disease include diabetes mellitus,  dyslipidemia, family history, hypertension and a sedentary lifestyle.     Review of systems  Constitutional: + steadily increasing body weight, current Body mass index is 26.76 kg/m., + fatigue, no subjective hyperthermia, no subjective hypothermia Eyes: no blurry vision, no xerophthalmia ENT: no sore throat, no nodules palpated in throat, no dysphagia/odynophagia, no hoarseness Cardiovascular: no chest pain, no shortness of breath, no palpitations, no leg swelling Respiratory: chronic cough-smoker, no shortness of breath Gastrointestinal: no vomiting/diarrhea Musculoskeletal: no muscle/joint aches Skin: no rashes, no hyperemia Neurological: no tremors, + numbness/tingling to BLE- improved with recent med changes, no dizziness Psychiatric: hx depression- controlled on meds, no anxiety  Objective:     BP 103/68 (BP Location: Left Arm, Patient Position: Sitting, Cuff Size: Normal)   Pulse (!) 103   Ht 5\' 5"  (1.651 m)   Wt 160 lb 12.8 oz (72.9 kg)   BMI 26.76 kg/m   Wt Readings from Last 3 Encounters:  05/29/23 160 lb 12.8 oz (72.9 kg)  05/11/23 157 lb 6.4 oz (71.4 kg)  05/02/23 158 lb 3.2 oz (71.8 kg)     BP Readings from Last 3 Encounters:  05/29/23 103/68  05/11/23 105/67  05/02/23 116/71     Physical Exam- Limited  Constitutional:  Body mass index is 26.76 kg/m. , not in acute distress Eyes:  EOMI, no exophthalmos Musculoskeletal: no gross deformities, strength intact in all four extremities, no gross restriction of joint movements Skin:  no rashes, no hyperemia, nicotinic discoloration to fingernails Neurological: no tremor with outstretched hands  Diabetic Foot Exam - Simple   No data filed      CMP ( most recent) CMP     Component Value Date/Time   NA 132 (L) 05/18/2023 0920   K 4.3 05/18/2023 0920   CL 93 (L) 05/18/2023 0920   CO2 23  05/18/2023 0920   GLUCOSE 109 (H) 05/18/2023 0920   GLUCOSE 197 (H) 02/28/2023 0931   BUN 6 (L) 05/18/2023 0920   CREATININE 0.72 05/18/2023 0920   CALCIUM 9.3 05/18/2023 0920   PROT 6.6 05/02/2023 0944   ALBUMIN 3.5 (L) 05/02/2023 0944   AST 69 (H) 05/02/2023 0944   ALT 17 05/02/2023 0944   ALKPHOS 288 (H) 05/02/2023 0944   BILITOT 1.9 (H) 05/02/2023 0944   GFRNONAA >60 02/28/2023 0931   GFRAA >60 06/20/2015 0420     Diabetic Labs (most recent): Lab Results  Component Value Date   HGBA1C 5.7 (A) 11/25/2022   HGBA1C 5.9 07/25/2022   HGBA1C 5.6 03/23/2022   MICROALBUR <3.0 (H) 12/21/2022   MICROALBUR 2 05/21/2021     Lipid Panel ( most recent) Lipid Panel     Component Value Date/Time   CHOL 204 (H) 12/16/2022 0929   CHOL 229 (H) 07/15/2022 1006   TRIG 97 12/16/2022 0929   HDL 57 12/16/2022 0929   HDL 74 07/15/2022 1006   CHOLHDL 3.6 12/16/2022 0929   VLDL 19 12/16/2022 0929   LDLCALC 128 (H) 12/16/2022 0929   LDLCALC 136 (H) 07/15/2022 1006   LABVLDL 19 07/15/2022 1006      Lab Results  Component Value Date   TSH 1.972 12/16/2022   TSH 1.350 07/15/2022   TSH 1.036 06/22/2015   FREET4 1.37 (H) 12/16/2022           Assessment & Plan:   1) Type 2 diabetes mellitus without complication, with long-term current use of insulin (HCC)  She presents today with her CGM showing at target glycemic profile overall.  Her POCT A1c today is 5.4% improving from last visit of 5.7%.  Analysis of her CGM Michael TIR 84%, TAR 14%, TBR <3% with a GMI of 6.6%.  She denies significant hypoglycemia.  She does take less insulin that prescribed at times due to at goal readings.  She says she has been making healthy food choices most of the time.  - Michelle Michael has currently uncontrolled symptomatic type 2 DM since 62 years of age.  -Recent labs reviewed.  - I had a long discussion with her about the progressive nature of diabetes and the pathology behind its complications. -her  diabetes is complicated by smoking, peripheral neuropathy and she remains at a high risk for more acute and chronic complications which include CAD, CVA, CKD, retinopathy, and neuropathy. These are all discussed in detail with her.  - Nutritional counseling repeated at each appointment due to patients tendency to fall back in to old habits.  - The patient admits there is a room for improvement in their diet and drink choices. -  Suggestion is made for the patient to avoid simple carbohydrates from their diet including Cakes, Sweet Desserts / Pastries, Ice Cream, Soda (diet and regular), Sweet Tea, Candies, Chips, Cookies, Sweet Pastries, Store Bought Juices, Alcohol in Excess of 1-2 drinks a day, Artificial Sweeteners, Coffee Creamer, and "Sugar-free" Products. This will help patient to have stable blood glucose profile and potentially avoid unintended weight gain.   - I encouraged the patient to switch to unprocessed or minimally processed complex starch and increased protein intake (animal or plant source), fruits, and vegetables.   - Patient is advised to stick to a routine mealtimes to eat 3 meals a day and avoid unnecessary snacks (to snack only to correct hypoglycemia).  - she will be scheduled with Norm Salt, RDN, CDE for diabetes education.    - I have approached her with the following individualized plan to manage her diabetes and patient agrees:   -Given her improvement, she is advised to stop her premixed insulin for now.  She can continue Metformin 500 mg ER daily and Victoza 1.8 mg SQ daily.  She knows this is an experiment and we can add her insulin back if glucose rebounds.  -she is encouraged to continue using her CGM to monitor her glucose 4 times daily (using her CGM), before meals and before bed, and to call the clinic if she has readings less than 70 or above 200 for 3 tests in a row.    - she is warned not to take insulin without proper monitoring per orders. -  Adjustment parameters are given to her for hypo and hyperglycemia in writing.  - She is not an ideal candidate for incretin therapy due to her heavy smoking history increasing her risk of pancreatitis but since she has already been on Victoza for quite some time, will allow her to continue it.  We did discuss the s/s of pancreatitis to watch for.  - Specific targets for  A1c; LDL, HDL, and Triglycerides were discussed with the patient.  2) Blood Pressure /Hypertension:  her blood pressure is controlled to target today.   she is advised to continue her current medications including Lasix 40 mg po twice daily, Spironolactone 100 mg p.o. daily with breakfast.  3) Lipids/Hyperlipidemia:    Review of her recent lipid panel from 12/16/22 showed uncontrolled LDL at 128 . She is on Repatha 140 mg injection every 2 weeks.    4)  Weight/Diet:  her  Body mass index is 26.76 kg/m.  -  clearly complicating her diabetes care.   she is a candidate for weight loss. I discussed with her the fact that loss of 5 - 10% of her  current body weight will have the most impact on her diabetes management.  Exercise, and detailed carbohydrates information provided  -  detailed on discharge instructions.  5) Chronic Care/Health Maintenance: -she is not on ACEI/ARB or Statin medications and is encouraged to initiate and continue to follow up with Ophthalmology, Dentist, Podiatrist at least yearly or according to recommendations, and advised to QUIT SMOKING. I have recommended yearly flu vaccine and pneumonia vaccine at least every 5 years; moderate intensity exercise for up to 150 minutes weekly; and sleep for at least 7 hours a day.  6) Vitamin D deficiency: Her recent vitamin D level was significantly low at 6 on 07/15/22.  Her PCP already started her on Ergocalciferol 50000 units weekly, I extended the treatment as she will most likely need it for 6 months or so to boost her levels to normal.    - she is advised to  maintain close follow up with Durwin Nora, Lucina Mellow, MD for primary care needs, as well as her other providers for optimal and coordinated care.  I adjusted her Gabapentin to 600 mg po twice daily and sent in script for Gabapentin 300 mcg to take as needed for breakthrough pain at night.     I spent  25  minutes in the care of the patient today including review of labs from CMP, Lipids, Thyroid Function, Hematology (current and previous including abstractions from other facilities); face-to-face time discussing  her blood glucose readings/logs, discussing hypoglycemia and hyperglycemia episodes and symptoms, medications doses, her options of short and long term treatment based on the latest standards of care / guidelines;  discussion about incorporating lifestyle medicine;  and documenting the encounter. Risk reduction counseling performed per USPSTF guidelines to reduce obesity and cardiovascular risk factors.     Please refer to Patient Instructions for Blood Glucose Monitoring and Insulin/Medications Dosing Guide"  in media tab for additional information. Please  also refer to " Patient Self Inventory" in the Media  tab for reviewed elements of pertinent patient history.  Michelle Michael participated in the discussions, expressed understanding, and voiced agreement with the above plans.  All questions were answered to her satisfaction. she is encouraged to contact clinic should she have any questions or concerns prior to her return visit.     Follow up plan: - Return in about 4 months (around 09/28/2023) for Diabetes F/U with A1c in office, No previsit labs, Bring meter and logs.   Ronny Bacon, [facility] [address] [place], [place] [address] Phone: [phone] Fax: [phone]  05/29/2023, 10:48 AM

## 2023-06-04 DIAGNOSIS — E119 Type 2 diabetes mellitus without complications: Secondary | ICD-10-CM | POA: Diagnosis not present

## 2023-06-05 ENCOUNTER — Other Ambulatory Visit: Payer: Self-pay | Admitting: Internal Medicine

## 2023-06-05 DIAGNOSIS — J432 Centrilobular emphysema: Secondary | ICD-10-CM

## 2023-06-09 ENCOUNTER — Ambulatory Visit (HOSPITAL_COMMUNITY): Payer: Medicare HMO

## 2023-06-13 ENCOUNTER — Other Ambulatory Visit: Payer: Self-pay | Admitting: Nurse Practitioner

## 2023-06-13 ENCOUNTER — Other Ambulatory Visit: Payer: Self-pay | Admitting: Internal Medicine

## 2023-06-13 DIAGNOSIS — F17200 Nicotine dependence, unspecified, uncomplicated: Secondary | ICD-10-CM

## 2023-06-13 DIAGNOSIS — J432 Centrilobular emphysema: Secondary | ICD-10-CM

## 2023-06-15 ENCOUNTER — Ambulatory Visit (HOSPITAL_COMMUNITY)
Admission: RE | Admit: 2023-06-15 | Discharge: 2023-06-15 | Disposition: A | Discharge status: Home / Self Care | Payer: Medicare HMO | Source: Ambulatory Visit | Attending: Gastroenterology | Admitting: Gastroenterology

## 2023-06-15 ENCOUNTER — Encounter: Payer: Self-pay | Admitting: *Deleted

## 2023-06-15 DIAGNOSIS — K7030 Alcoholic cirrhosis of liver without ascites: Secondary | ICD-10-CM | POA: Diagnosis not present

## 2023-06-15 DIAGNOSIS — K7460 Unspecified cirrhosis of liver: Secondary | ICD-10-CM | POA: Diagnosis not present

## 2023-06-21 ENCOUNTER — Ambulatory Visit (HOSPITAL_COMMUNITY): Admission: RE | Admit: 2023-06-21 | Payer: Medicare HMO | Source: Ambulatory Visit

## 2023-06-26 ENCOUNTER — Other Ambulatory Visit: Payer: Self-pay | Admitting: Internal Medicine

## 2023-06-26 DIAGNOSIS — K7030 Alcoholic cirrhosis of liver without ascites: Secondary | ICD-10-CM

## 2023-06-26 DIAGNOSIS — E785 Hyperlipidemia, unspecified: Secondary | ICD-10-CM

## 2023-06-26 DIAGNOSIS — Z9189 Other specified personal risk factors, not elsewhere classified: Secondary | ICD-10-CM

## 2023-06-26 DIAGNOSIS — Z5309 Procedure and treatment not carried out because of other contraindication: Secondary | ICD-10-CM

## 2023-07-10 ENCOUNTER — Ambulatory Visit: Payer: Medicare HMO | Admitting: Internal Medicine

## 2023-07-12 ENCOUNTER — Other Ambulatory Visit: Payer: Self-pay | Admitting: Internal Medicine

## 2023-07-12 DIAGNOSIS — Z5309 Procedure and treatment not carried out because of other contraindication: Secondary | ICD-10-CM

## 2023-07-12 DIAGNOSIS — E785 Hyperlipidemia, unspecified: Secondary | ICD-10-CM

## 2023-07-12 DIAGNOSIS — Z9189 Other specified personal risk factors, not elsewhere classified: Secondary | ICD-10-CM

## 2023-07-18 DIAGNOSIS — H903 Sensorineural hearing loss, bilateral: Secondary | ICD-10-CM | POA: Diagnosis not present

## 2023-07-18 DIAGNOSIS — H6992 Unspecified Eustachian tube disorder, left ear: Secondary | ICD-10-CM | POA: Diagnosis not present

## 2023-07-21 ENCOUNTER — Other Ambulatory Visit: Payer: Self-pay | Admitting: Internal Medicine

## 2023-07-21 DIAGNOSIS — K7030 Alcoholic cirrhosis of liver without ascites: Secondary | ICD-10-CM

## 2023-07-21 DIAGNOSIS — F331 Major depressive disorder, recurrent, moderate: Secondary | ICD-10-CM | POA: Diagnosis not present

## 2023-07-21 DIAGNOSIS — F1099 Alcohol use, unspecified with unspecified alcohol-induced disorder: Secondary | ICD-10-CM | POA: Diagnosis not present

## 2023-07-21 DIAGNOSIS — Z5309 Procedure and treatment not carried out because of other contraindication: Secondary | ICD-10-CM

## 2023-07-21 DIAGNOSIS — Z9189 Other specified personal risk factors, not elsewhere classified: Secondary | ICD-10-CM

## 2023-07-21 DIAGNOSIS — E785 Hyperlipidemia, unspecified: Secondary | ICD-10-CM

## 2023-07-30 ENCOUNTER — Other Ambulatory Visit: Payer: Self-pay | Admitting: Internal Medicine

## 2023-07-30 DIAGNOSIS — J432 Centrilobular emphysema: Secondary | ICD-10-CM

## 2023-08-02 ENCOUNTER — Ambulatory Visit (HOSPITAL_COMMUNITY)
Admission: RE | Admit: 2023-08-02 | Discharge: 2023-08-02 | Disposition: A | Discharge status: Home / Self Care | Payer: Medicare HMO | Source: Ambulatory Visit | Attending: Internal Medicine | Admitting: Internal Medicine

## 2023-08-02 DIAGNOSIS — R600 Localized edema: Secondary | ICD-10-CM | POA: Diagnosis not present

## 2023-08-02 DIAGNOSIS — R0609 Other forms of dyspnea: Secondary | ICD-10-CM | POA: Diagnosis not present

## 2023-08-02 NOTE — Progress Notes (Signed)
  Echocardiogram 2D Echocardiogram has been performed.  Michelle Michael 08/02/2023, 9:27 AM

## 2023-08-03 ENCOUNTER — Ambulatory Visit: Payer: Medicare HMO | Admitting: Internal Medicine

## 2023-08-04 ENCOUNTER — Other Ambulatory Visit: Payer: Self-pay | Admitting: Internal Medicine

## 2023-08-04 DIAGNOSIS — E1140 Type 2 diabetes mellitus with diabetic neuropathy, unspecified: Secondary | ICD-10-CM

## 2023-08-28 ENCOUNTER — Other Ambulatory Visit: Payer: Self-pay | Admitting: Internal Medicine

## 2023-08-28 DIAGNOSIS — J432 Centrilobular emphysema: Secondary | ICD-10-CM

## 2023-09-13 ENCOUNTER — Encounter: Payer: Self-pay | Admitting: Nurse Practitioner

## 2023-09-13 ENCOUNTER — Other Ambulatory Visit: Payer: Self-pay | Admitting: Nurse Practitioner

## 2023-09-13 MED ORDER — "INSULIN SYRINGE 29G X 1/2"" 0.5 ML MISC": 3 refills | Status: DC

## 2023-09-13 MED ORDER — BD PEN NEEDLE NANO 2ND GEN 32G X 4 MM MISC: 6 refills | Status: DC

## 2023-09-13 MED ORDER — NOVOLIN 70/30 FLEXPEN (70-30) 100 UNIT/ML ~~LOC~~ SUPN: 15.0000 [IU] | PEN_INJECTOR | Freq: Two times a day (BID) | SUBCUTANEOUS | 3 refills | Status: DC

## 2023-09-15 DIAGNOSIS — F329 Major depressive disorder, single episode, unspecified: Secondary | ICD-10-CM | POA: Diagnosis not present

## 2023-09-15 DIAGNOSIS — F1099 Alcohol use, unspecified with unspecified alcohol-induced disorder: Secondary | ICD-10-CM | POA: Diagnosis not present

## 2023-09-18 ENCOUNTER — Other Ambulatory Visit: Payer: Self-pay | Admitting: Internal Medicine

## 2023-09-18 DIAGNOSIS — K7030 Alcoholic cirrhosis of liver without ascites: Secondary | ICD-10-CM

## 2023-09-24 ENCOUNTER — Other Ambulatory Visit: Payer: Self-pay | Admitting: Internal Medicine

## 2023-09-24 DIAGNOSIS — J432 Centrilobular emphysema: Secondary | ICD-10-CM

## 2023-09-25 ENCOUNTER — Other Ambulatory Visit: Payer: Self-pay | Admitting: Internal Medicine

## 2023-09-25 DIAGNOSIS — Z5309 Procedure and treatment not carried out because of other contraindication: Secondary | ICD-10-CM

## 2023-09-25 DIAGNOSIS — E876 Hypokalemia: Secondary | ICD-10-CM

## 2023-09-25 DIAGNOSIS — E785 Hyperlipidemia, unspecified: Secondary | ICD-10-CM

## 2023-09-25 DIAGNOSIS — F17200 Nicotine dependence, unspecified, uncomplicated: Secondary | ICD-10-CM

## 2023-09-25 DIAGNOSIS — Z9189 Other specified personal risk factors, not elsewhere classified: Secondary | ICD-10-CM

## 2023-09-28 ENCOUNTER — Ambulatory Visit (INDEPENDENT_AMBULATORY_CARE_PROVIDER_SITE_OTHER): Payer: Medicare HMO | Admitting: Nurse Practitioner

## 2023-09-28 ENCOUNTER — Other Ambulatory Visit: Payer: Self-pay | Admitting: Internal Medicine

## 2023-09-28 ENCOUNTER — Encounter: Payer: Self-pay | Admitting: Nurse Practitioner

## 2023-09-28 VITALS — BP 94/60 | HR 94 | Ht 65.0 in | Wt 154.2 lb

## 2023-09-28 DIAGNOSIS — E559 Vitamin D deficiency, unspecified: Secondary | ICD-10-CM

## 2023-09-28 DIAGNOSIS — Z794 Long term (current) use of insulin: Secondary | ICD-10-CM

## 2023-09-28 DIAGNOSIS — Z7984 Long term (current) use of oral hypoglycemic drugs: Secondary | ICD-10-CM | POA: Diagnosis not present

## 2023-09-28 DIAGNOSIS — I10 Essential (primary) hypertension: Secondary | ICD-10-CM

## 2023-09-28 DIAGNOSIS — Z9189 Other specified personal risk factors, not elsewhere classified: Secondary | ICD-10-CM

## 2023-09-28 DIAGNOSIS — E119 Type 2 diabetes mellitus without complications: Secondary | ICD-10-CM

## 2023-09-28 DIAGNOSIS — E782 Mixed hyperlipidemia: Secondary | ICD-10-CM

## 2023-09-28 DIAGNOSIS — Z7985 Long-term (current) use of injectable non-insulin antidiabetic drugs: Secondary | ICD-10-CM | POA: Diagnosis not present

## 2023-09-28 DIAGNOSIS — Z5309 Procedure and treatment not carried out because of other contraindication: Secondary | ICD-10-CM

## 2023-09-28 DIAGNOSIS — E785 Hyperlipidemia, unspecified: Secondary | ICD-10-CM

## 2023-09-28 LAB — POCT GLYCOSYLATED HEMOGLOBIN (HGB A1C): Hemoglobin A1C: 501 % — AB (ref 4.0–5.6)

## 2023-09-28 MED ORDER — NOVOLIN 70/30 FLEXPEN (70-30) 100 UNIT/ML ~~LOC~~ SUPN: 15.0000 [IU] | PEN_INJECTOR | Freq: Two times a day (BID) | SUBCUTANEOUS | 3 refills | Status: DC

## 2023-09-28 MED ORDER — METFORMIN HCL ER 500 MG PO TB24: 500.0000 mg | ORAL_TABLET | Freq: Every day | ORAL | 3 refills | Status: DC

## 2023-09-28 MED ORDER — FREESTYLE LIBRE 3 READER DEVI
1.0000 | Freq: Once | 0 refills | Status: DC
Start: 2023-09-28 — End: 2023-12-21

## 2023-09-28 MED ORDER — FREESTYLE LIBRE 3 PLUS SENSOR MISC
3 refills | Status: DC
Start: 2023-09-28 — End: 2024-02-03

## 2023-09-28 NOTE — Progress Notes (Signed)
Endocrinology Follow Up Note       09/28/2023, 11:03 AM   Subjective:    Patient ID: Michelle Michael, female    DOB: 1961-06-22.  Michelle Michael is being seen in follow up after being in seen in consultation for management of currently uncontrolled symptomatic diabetes requested by  Billie Lade, MD.   Past Medical History:  Diagnosis Date   Anemia    Anxiety    Centrilobular emphysema (HCC) 03/22/2023   Cirrhosis (HCC)    CKD (chronic kidney disease)    Depression    Diabetes mellitus, type II (HCC)    GERD (gastroesophageal reflux disease)    Hyperlipidemia    Hypertension    Hypokalemia    Hyponatremia    Neuromuscular disorder (HCC) 2021   Substance abuse (HCC)    Years ago   Ulcer 1980s    Past Surgical History:  Procedure Laterality Date   BIOPSY  02/10/2023   Procedure: BIOPSY;  Surgeon: Lanelle Bal, DO;  [facility]: [facility];  Service: Endoscopy;;   CESAREAN SECTION  1984   CHOLECYSTECTOMY     ESOPHAGEAL BRUSHING  02/10/2023   Procedure: ESOPHAGEAL BRUSHING;  Surgeon: Lanelle Bal, DO;  Location: [facility];  Service: Endoscopy;;   ESOPHAGOGASTRODUODENOSCOPY (EGD) WITH PROPOFOL N/A 02/10/2023   Procedure: ESOPHAGOGASTRODUODENOSCOPY (EGD) WITH PROPOFOL;  Surgeon: Lanelle Bal, DO;  Location: [facility];  Service: Endoscopy;  Laterality: N/A;  8:45 am   FRACTURE SURGERY  2005   ORTHOPEDIC SURGERY     SPINE SURGERY  2003   TUBAL LIGATION  1993    Social History   Socioeconomic History   Marital status: Divorced    Spouse name: Not on file   Number of children: 2   Years of education: 12   Highest education level: Associate degree: occupational, Scientist, product/process development, or vocational program  Occupational History   Not on file  Tobacco Use   Smoking status: Every Day    Current packs/day: 1.00    Average packs/day: 1 pack/day for 40.0 years (40.0 ttl pk-yrs)    Types:  Cigarettes    Passive exposure: Current   Smokeless tobacco: Never   Tobacco comments:    Smoking Cessation Classes Offered.  Vaping Use   Vaping status: Former  Substance and Sexual Activity   Alcohol use: Yes    Alcohol/week: 10.0 standard drinks of alcohol    Types: 10 Glasses of wine per week    Comment: occassional wine- 2-3 times weekly   Drug use: Never   Sexual activity: Not Currently    Partners: Male    Birth control/protection: Abstinence, Post-menopausal, None  Other Topics Concern   Not on file  Social History Narrative   Lives with her room mate    Social Determinants of Health   Financial Resource Strain: Low Risk  (03/29/2023)   Overall Financial Resource Strain (CARDIA)    Difficulty of Paying Living Expenses: Not hard at all  Food Insecurity: No Food Insecurity (03/29/2023)   Hunger Vital Sign    Worried About Running Out of Food in the Last Year: Never true    Ran Out of Food in the Last  Year: Never true  Transportation Needs: Unmet Transportation Needs (03/29/2023)   PRAPARE - Administrator, Civil Service (Medical): Yes    Lack of Transportation (Non-Medical): Yes  Physical Activity: Inactive (01/16/2023)   Exercise Vital Sign    Days of Exercise per Week: 0 days    Minutes of Exercise per Session: 0 min  Stress: Stress Concern Present (01/16/2023)   [employer] of Occupational Health - Occupational Stress Questionnaire    Feeling of Stress : Rather much  Social Connections: Socially Isolated (03/29/2023)   Social Connection and Isolation Panel [NHANES]    Frequency of Communication with Friends and Family: Once a week    Frequency of Social Gatherings with Friends and Family: Once a week    Attends Religious Services: Never    Database administrator or Organizations: No    Attends Engineer, structural: Never    Marital Status: Divorced    Family History  Problem Relation Age of Onset   Diabetes Mother    Hypertension Mother     Alcohol abuse Mother    Cancer Mother    Hypertension Father    Arthritis Father    Heart disease Father    Cancer Daughter    Obesity Daughter    Drug abuse Son    Breast cancer Neg Hx     Outpatient Encounter Medications as of 09/28/2023  Medication Sig   busPIRone (BUSPAR) 10 MG tablet Take 10 mg by mouth 3 (three) times daily.   Continuous Blood Gluc Transmit (DEXCOM G6 TRANSMITTER) MISC Use transmitter every 90 days.   Continuous Glucose Receiver (FREESTYLE LIBRE 3 READER) DEVI 1 Device by Does not apply route once for 1 dose.   Continuous Glucose Sensor (FREESTYLE LIBRE 3 PLUS SENSOR) MISC Change sensor every 15 days.   DULoxetine (CYMBALTA) 30 MG capsule Take 30 mg by mouth every morning.   fluticasone (FLONASE) 50 MCG/ACT nasal spray Use 2 spray(s) in each nostril once daily   folic acid (FOLVITE) 400 MCG tablet Take 400 mcg by mouth daily.   furosemide (LASIX) 40 MG tablet Take 1 tablet by mouth twice daily   gabapentin (NEURONTIN) 300 MG capsule Take 1 capsule (300 mg total) by mouth daily as needed (nerve pain).   gabapentin (NEURONTIN) 600 MG tablet TAKE 1 TABLET BY MOUTH THREE TIMES DAILY   Glucagon, rDNA, (GLUCAGON EMERGENCY) 1 MG KIT Inject 1 mg into the muscle as needed (low blood sugar).   Insulin Pen Needle (BD PEN NEEDLE NANO 2ND GEN) 32G X 4 MM MISC Use to inject insulin and Victoza for 3 injections per day   INSULIN SYRINGE .5CC/29G 29G X 1/2" 0.5 ML MISC Use to inject insulin twice daily.   liraglutide (VICTOZA) 18 MG/3ML SOPN Inject 1.8 mg into the skin daily.   ondansetron (ZOFRAN) 4 MG tablet TAKE 1 TABLET BY MOUTH EVERY 8 HOURS AS NEEDED   pantoprazole (PROTONIX) 40 MG tablet Take 1 tablet (40 mg total) by mouth 2 (two) times daily.   Potassium Chloride 40 MEQ/15ML (20%) SOLN TAKE  7.5 ML BY MOUTH DAILY   REPATHA 140 MG/ML SOSY INJECT 140 MG INTO THE SKIN EVERY 14 DAYS   spironolactone (ALDACTONE) 100 MG tablet Take 100 mg by mouth daily.   STIOLTO RESPIMAT  2.5-2.5 MCG/ACT AERS INHALE 2 PUFFS BY MOUTH ONCE DAILY   traZODone (DESYREL) 50 MG tablet Take 1 tablet (50 mg total) by mouth at bedtime as needed for sleep.  VENTOLIN HFA 108 (90 Base) MCG/ACT inhaler INHALE 2 PUFFS BY MOUTH EVERY 6 HOURS AS NEEDED FOR WHEEZING FOR SHORTNESS OF BREATH   [DISCONTINUED] insulin isophane & regular human KwikPen (NOVOLIN 70/30 KWIKPEN) (70-30) 100 UNIT/ML KwikPen Inject 15 Units into the skin in the morning and at bedtime.   [DISCONTINUED] metFORMIN (GLUCOPHAGE-XR) 500 MG 24 hr tablet Take 1 tablet (500 mg total) by mouth daily with breakfast. (Patient taking differently: Take 500 mg by mouth daily with lunch.)   insulin isophane & regular human KwikPen (NOVOLIN 70/30 KWIKPEN) (70-30) 100 UNIT/ML KwikPen Inject 15 Units into the skin in the morning and at bedtime.   metFORMIN (GLUCOPHAGE-XR) 500 MG 24 hr tablet Take 1 tablet (500 mg total) by mouth daily with breakfast.   [DISCONTINUED] Continuous Glucose Sensor (DEXCOM G7 SENSOR) MISC Inject 1 Application into the skin as directed. Change sensor every 10 days as directed. (Patient not taking: Reported on 09/28/2023)   No facility-administered encounter medications on file as of 09/28/2023.    ALLERGIES: No Known Allergies  VACCINATION STATUS: Immunization History  Administered Date(s) Administered   Hepatitis A, Adult 11/13/2020   Tdap 11/13/2020    Diabetes She presents for her follow-up diabetic visit. She has type 2 diabetes mellitus. Onset time: Diagnosed at approx age of 37. Her disease course has been improving. There are no hypoglycemic associated symptoms. Associated symptoms include fatigue and foot paresthesias. There are no hypoglycemic complications. Symptoms are stable. Diabetic complications include nephropathy and peripheral neuropathy. Risk factors for coronary artery disease include diabetes mellitus, dyslipidemia, family history, hypertension, tobacco exposure and sedentary lifestyle. Current  diabetic treatment includes oral agent (monotherapy) and insulin injections (and Victoza). She is compliant with treatment most of the time. Her weight is fluctuating minimally. She is following a generally healthy diet. When asked about meal planning, she reported none. She has not had a previous visit with a dietitian. She participates in exercise intermittently. Her home blood glucose trend is decreasing steadily. Her overall blood glucose range is 130-140 mg/dl. (She presents today with her CGM showing tight fasting and at target postprandial readings.  Her POCT A1c today is 5.1% improving from last visit of 5.4%.  Analysis of her CGM Michael TIR 85%, TAR 13%, TBR <3% with a GMI of 6.5%.  ) An ACE inhibitor/angiotensin II receptor blocker is not being taken. She does not see a podiatrist.Eye exam is current.  Hypertension This is a chronic problem. The current episode started more than 1 year ago. The problem has been resolved since onset. The problem is controlled. There are no associated agents to hypertension. Risk factors for coronary artery disease include diabetes mellitus, dyslipidemia, smoking/tobacco exposure, family history and sedentary lifestyle. Past treatments include diuretics. The current treatment provides mild improvement. Compliance problems include diet, exercise and psychosocial issues.   Hyperlipidemia This is a chronic problem. The current episode started more than 1 year ago. The problem is uncontrolled. Recent lipid tests were reviewed and are high. Exacerbating diseases include diabetes. Factors aggravating her hyperlipidemia include smoking and fatty foods. She is currently on no antihyperlipidemic treatment. Compliance problems include adherence to diet, adherence to exercise and psychosocial issues.  Risk factors for coronary artery disease include diabetes mellitus, dyslipidemia, family history, hypertension and a sedentary lifestyle.     Review of systems  Constitutional: +  stable body weight, current Body mass index is 25.66 kg/m., + fatigue, no subjective hyperthermia, no subjective hypothermia Eyes: no blurry vision, no xerophthalmia ENT: no sore throat,  no nodules palpated in throat, no dysphagia/odynophagia, no hoarseness Cardiovascular: no chest pain, no shortness of breath, no palpitations, no leg swelling Respiratory: chronic cough-smoker, no shortness of breath Gastrointestinal: no vomiting/diarrhea Musculoskeletal: no muscle/joint aches Skin: no rashes, no hyperemia Neurological: no tremors, + numbness/tingling to BLE- improved with recent med changes, no dizziness Psychiatric: hx depression- controlled on meds, no anxiety  Objective:     BP 94/60 (BP Location: Left Arm, Patient Position: Sitting, Cuff Size: Large)   Pulse 94   Ht 5\' 5"  (1.651 m)   Wt 154 lb 3.2 oz (69.9 kg)   BMI 25.66 kg/m   Wt Readings from Last 3 Encounters:  09/28/23 154 lb 3.2 oz (69.9 kg)  05/29/23 160 lb 12.8 oz (72.9 kg)  05/11/23 157 lb 6.4 oz (71.4 kg)     BP Readings from Last 3 Encounters:  09/28/23 94/60  05/29/23 103/68  05/11/23 105/67     Physical Exam- Limited  Constitutional:  Body mass index is 25.66 kg/m. , not in acute distress Eyes:  EOMI, no exophthalmos Musculoskeletal: no gross deformities, strength intact in all four extremities, no gross restriction of joint movements Skin:  no rashes, no hyperemia, nicotinic discoloration to fingernails Neurological: no tremor with outstretched hands  Diabetic Foot Exam - Simple   No data filed      CMP ( most recent) CMP     Component Value Date/Time   NA 132 (L) 05/18/2023 0920   K 4.3 05/18/2023 0920   CL 93 (L) 05/18/2023 0920   CO2 23 05/18/2023 0920   GLUCOSE 109 (H) 05/18/2023 0920   GLUCOSE 197 (H) 02/28/2023 0931   BUN 6 (L) 05/18/2023 0920   CREATININE 0.72 05/18/2023 0920   CALCIUM 9.3 05/18/2023 0920   PROT 6.6 05/02/2023 0944   ALBUMIN 3.5 (L) 05/02/2023 0944   AST 69 (H)  05/02/2023 0944   ALT 17 05/02/2023 0944   ALKPHOS 288 (H) 05/02/2023 0944   BILITOT 1.9 (H) 05/02/2023 0944   GFRNONAA >60 02/28/2023 0931   GFRAA >60 06/20/2015 0420     Diabetic Labs (most recent): Lab Results  Component Value Date   HGBA1C 501.0 (A) 09/28/2023   HGBA1C 5.7 (A) 11/25/2022   HGBA1C 5.9 07/25/2022   MICROALBUR <3.0 (H) 12/21/2022   MICROALBUR 2 05/21/2021     Lipid Panel ( most recent) Lipid Panel     Component Value Date/Time   CHOL 204 (H) 12/16/2022 0929   CHOL 229 (H) 07/15/2022 1006   TRIG 97 12/16/2022 0929   HDL 57 12/16/2022 0929   HDL 74 07/15/2022 1006   CHOLHDL 3.6 12/16/2022 0929   VLDL 19 12/16/2022 0929   LDLCALC 128 (H) 12/16/2022 0929   LDLCALC 136 (H) 07/15/2022 1006   LABVLDL 19 07/15/2022 1006      Lab Results  Component Value Date   TSH 1.972 12/16/2022   TSH 1.350 07/15/2022   TSH 1.036 06/22/2015   FREET4 1.37 (H) 12/16/2022           Assessment & Plan:   1) Type 2 diabetes mellitus without complication, with long-term current use of insulin (HCC)  She presents today with her CGM showing tight fasting and at target postprandial readings.  Her POCT A1c today is 5.1% improving from last visit of 5.4%.  Analysis of her CGM Michael TIR 85%, TAR 13%, TBR <3% with a GMI of 6.5%.    - Michelle Michael has currently uncontrolled symptomatic type 2 DM since 62  years of age.  -Recent labs reviewed.  - I had a long discussion with her about the progressive nature of diabetes and the pathology behind its complications. -her diabetes is complicated by smoking, peripheral neuropathy and she remains at a high risk for more acute and chronic complications which include CAD, CVA, CKD, retinopathy, and neuropathy. These are all discussed in detail with her.  - Nutritional counseling repeated at each appointment due to patients tendency to fall back in to old habits.  - The patient admits there is a room for improvement in their diet and  drink choices. -  Suggestion is made for the patient to avoid simple carbohydrates from their diet including Cakes, Sweet Desserts / Pastries, Ice Cream, Soda (diet and regular), Sweet Tea, Candies, Chips, Cookies, Sweet Pastries, Store Bought Juices, Alcohol in Excess of 1-2 drinks a day, Artificial Sweeteners, Coffee Creamer, and "Sugar-free" Products. This will help patient to have stable blood glucose profile and potentially avoid unintended weight gain.   - I encouraged the patient to switch to unprocessed or minimally processed complex starch and increased protein intake (animal or plant source), fruits, and vegetables.   - Patient is advised to stick to a routine mealtimes to eat 3 meals a day and avoid unnecessary snacks (to snack only to correct hypoglycemia).  - she will be scheduled with Norm Salt, RDN, CDE for diabetes education.    - I have approached her with the following individualized plan to manage her diabetes and patient agrees:   -She is advised to continue her premixed 70/30 insulin at 15 units with breakfast and 10 units with supper if glucose is above 90 and she is eating (was added back between visits after rebound in glucose readings).  She can continue Metformin 500 mg ER daily and Victoza 1.8 mg SQ daily.   -she is encouraged to continue using her CGM to monitor her glucose 4 times daily (using her CGM), before meals and before bed, and to call the clinic if she has readings less than 70 or above 200 for 3 tests in a row.  She asks that [place] 3 be sent to [facility] as she and [facility] are having a disagreement with pricing for Dexcom.  - she is warned not to take insulin without proper monitoring per orders. - Adjustment parameters are given to her for hypo and hyperglycemia in writing.  - She is not an ideal candidate for incretin therapy due to her heavy smoking history increasing her risk of pancreatitis but since she has already been on Victoza for quite  some time, will allow her to continue it.  We did discuss the s/s of pancreatitis to watch for.  - Specific targets for  A1c; LDL, HDL, and Triglycerides were discussed with the patient.  2) Blood Pressure /Hypertension:  her blood pressure is controlled to target today.   she is advised to continue her current medications including Lasix 40 mg po twice daily, Spironolactone 100 mg p.o. daily with breakfast.  3) Lipids/Hyperlipidemia:    Review of her recent lipid panel from 12/16/22 showed uncontrolled LDL at 128 . She is on Repatha 140 mg injection every 2 weeks.    4)  Weight/Diet:  her Body mass index is 25.66 kg/m.  -  clearly complicating her diabetes care.   she is a candidate for weight loss. I discussed with her the fact that loss of 5 - 10% of her  current body weight will have the most impact  on her diabetes management.  Exercise, and detailed carbohydrates information provided  -  detailed on discharge instructions.  5) Chronic Care/Health Maintenance: -she is not on ACEI/ARB or Statin medications and is encouraged to initiate and continue to follow up with Ophthalmology, Dentist, Podiatrist at least yearly or according to recommendations, and advised to QUIT SMOKING. I have recommended yearly flu vaccine and pneumonia vaccine at least every 5 years; moderate intensity exercise for up to 150 minutes weekly; and sleep for at least 7 hours a day.  6) Vitamin D deficiency: Her recent vitamin D level was significantly low at 6 on 07/15/22.  Her PCP already started her on Ergocalciferol 50000 units weekly, I extended the treatment as she will most likely need it for 6 months or so to boost her levels to normal.    - she is advised to maintain close follow up with Durwin Nora, Lucina Mellow, MD for primary care needs, as well as her other providers for optimal and coordinated care.  I adjusted her Gabapentin to 600 mg po twice daily and sent in script for Gabapentin 300 mcg to take as needed for  breakthrough pain at night.     I spent  34  minutes in the care of the patient today including review of labs from CMP, Lipids, Thyroid Function, Hematology (current and previous including abstractions from other facilities); face-to-face time discussing  her blood glucose readings/logs, discussing hypoglycemia and hyperglycemia episodes and symptoms, medications doses, her options of short and long term treatment based on the latest standards of care / guidelines;  discussion about incorporating lifestyle medicine;  and documenting the encounter. Risk reduction counseling performed per USPSTF guidelines to reduce obesity and cardiovascular risk factors.     Please refer to Patient Instructions for Blood Glucose Monitoring and Insulin/Medications Dosing Guide"  in media tab for additional information. Please  also refer to " Patient Self Inventory" in the Media  tab for reviewed elements of pertinent patient history.  Michelle Michael participated in the discussions, expressed understanding, and voiced agreement with the above plans.  All questions were answered to her satisfaction. she is encouraged to contact clinic should she have any questions or concerns prior to her return visit.     Follow up plan: - Return in about 4 months (around 01/29/2024) for Diabetes F/U with A1c in office, No previsit labs, Bring meter and logs.   Ronny Bacon, [facility] [address] [place], [place] [address] Phone: [phone] Fax: [phone]  09/28/2023, 11:03 AM

## 2023-09-29 ENCOUNTER — Ambulatory Visit: Payer: Medicare HMO | Admitting: Internal Medicine

## 2023-09-29 ENCOUNTER — Encounter: Payer: Self-pay | Admitting: Internal Medicine

## 2023-09-29 VITALS — BP 110/62 | HR 105 | Resp 16 | Ht 65.0 in | Wt 156.2 lb

## 2023-09-29 DIAGNOSIS — Z2821 Immunization not carried out because of patient refusal: Secondary | ICD-10-CM | POA: Diagnosis not present

## 2023-09-29 DIAGNOSIS — K7030 Alcoholic cirrhosis of liver without ascites: Secondary | ICD-10-CM | POA: Diagnosis not present

## 2023-09-29 DIAGNOSIS — Z1329 Encounter for screening for other suspected endocrine disorder: Secondary | ICD-10-CM

## 2023-09-29 DIAGNOSIS — E785 Hyperlipidemia, unspecified: Secondary | ICD-10-CM

## 2023-09-29 DIAGNOSIS — Z794 Long term (current) use of insulin: Secondary | ICD-10-CM

## 2023-09-29 DIAGNOSIS — E1159 Type 2 diabetes mellitus with other circulatory complications: Secondary | ICD-10-CM | POA: Diagnosis not present

## 2023-09-29 DIAGNOSIS — I10 Essential (primary) hypertension: Secondary | ICD-10-CM

## 2023-09-29 DIAGNOSIS — Z1321 Encounter for screening for nutritional disorder: Secondary | ICD-10-CM | POA: Diagnosis not present

## 2023-09-29 DIAGNOSIS — G629 Polyneuropathy, unspecified: Secondary | ICD-10-CM

## 2023-09-29 NOTE — Progress Notes (Signed)
Established Patient Office Visit  Subjective   Patient ID: Michelle Michael, female    DOB: 07-Sep-1961  Age: 62 y.o. MRN: [id number]  Chief Complaint  Patient presents with   Follow-up    States her ankles and feet are still swelling, right more so than left    Michelle Michael returns to care today for routine follow-up.  She was last evaluated by me on 5/7 for an acute visit in the setting of bilateral lower extremity edema.  Repeat TTE was ordered and MELD labs were repeated.  Ultimately, no medication changes were made.  In the interim, she has been evaluated by gastroenterology and endocrinology for follow-up.  There have otherwise been no acute interval events.  Michelle Michael reports feeling well today.  She endorses persistent lower extremity edema that is unchanged in severity but is otherwise asymptomatic and has no acute concerns to discuss.  Past Medical History:  Diagnosis Date   Anemia    Anxiety    Centrilobular emphysema (HCC) 03/22/2023   Cirrhosis (HCC)    CKD (chronic kidney disease)    Depression    Diabetes mellitus, type II (HCC)    GERD (gastroesophageal reflux disease)    Hyperlipidemia    Hypertension    Hypokalemia    Hyponatremia    Neuromuscular disorder (HCC) 2021   Substance abuse (HCC)    Years ago   Ulcer 1980s   Past Surgical History:  Procedure Laterality Date   BIOPSY  02/10/2023   Procedure: BIOPSY;  Surgeon: Lanelle Bal, DO;  Location: [facility];  Service: Endoscopy;;   CESAREAN SECTION  1984   CHOLECYSTECTOMY     ESOPHAGEAL BRUSHING  02/10/2023   Procedure: ESOPHAGEAL BRUSHING;  Surgeon: Lanelle Bal, DO;  Location: [facility];  Service: Endoscopy;;   ESOPHAGOGASTRODUODENOSCOPY (EGD) WITH PROPOFOL N/A 02/10/2023   Procedure: ESOPHAGOGASTRODUODENOSCOPY (EGD) WITH PROPOFOL;  Surgeon: Lanelle Bal, DO;  Location: [facility];  Service: Endoscopy;  Laterality: N/A;  8:45 am   FRACTURE SURGERY  2005   ORTHOPEDIC SURGERY     SPINE  SURGERY  2003   TUBAL LIGATION  1993   Social History   Tobacco Use   Smoking status: Every Day    Current packs/day: 1.00    Average packs/day: 1 pack/day for 40.0 years (40.0 ttl pk-yrs)    Types: Cigarettes    Passive exposure: Current   Smokeless tobacco: Never   Tobacco comments:    Smoking Cessation Classes Offered.  Vaping Use   Vaping status: Former  Substance Use Topics   Alcohol use: Yes    Alcohol/week: 10.0 standard drinks of alcohol    Types: 10 Glasses of wine per week    Comment: occassional wine- 2-3 times weekly   Drug use: Never   Family History  Problem Relation Age of Onset   Diabetes Mother    Hypertension Mother    Alcohol abuse Mother    Cancer Mother    Hypertension Father    Arthritis Father    Heart disease Father    Cancer Daughter    Obesity Daughter    Drug abuse Son    Breast cancer Neg Hx    No Known Allergies  Review of Systems  Constitutional:  Negative for chills and fever.  HENT:  Negative for sore throat.   Respiratory:  Negative for cough and shortness of breath.   Cardiovascular:  Positive for leg swelling. Negative for chest pain and palpitations.  Gastrointestinal:  Negative for abdominal pain, blood in stool, constipation, diarrhea, nausea and vomiting.  Genitourinary:  Negative for dysuria and hematuria.  Musculoskeletal:  Negative for myalgias.  Skin:  Negative for itching and rash.  Neurological:  Negative for dizziness and headaches.  Psychiatric/Behavioral:  Negative for depression and suicidal ideas.      Objective:     BP 110/62   Pulse (!) 105   Resp 16   Ht 5\' 5"  (1.651 m)   Wt 156 lb 3.2 oz (70.9 kg)   SpO2 96%   BMI 25.99 kg/m  BP Readings from Last 3 Encounters:  09/29/23 110/62  09/28/23 94/60  05/29/23 103/68   Physical Exam Constitutional:      General: She is not in acute distress.    Appearance: Normal appearance. She is obese. She is not toxic-appearing.  HENT:     Head: Normocephalic  and atraumatic.     Right Ear: External ear normal.     Left Ear: External ear normal.     Nose: Nose normal. No congestion or rhinorrhea.     Mouth/Throat:     Mouth: Mucous membranes are moist.     Pharynx: Oropharynx is clear. No oropharyngeal exudate or posterior oropharyngeal erythema.  Eyes:     General: No scleral icterus.    Extraocular Movements: Extraocular movements intact.     Conjunctiva/sclera: Conjunctivae normal.     Pupils: Pupils are equal, round, and reactive to light.  Cardiovascular:     Rate and Rhythm: Normal rate and regular rhythm.     Pulses: Normal pulses.     Heart sounds: Normal heart sounds. No murmur heard.    No friction rub. No gallop.  Pulmonary:     Effort: Pulmonary effort is normal.     Breath sounds: Normal breath sounds. No wheezing, rhonchi or rales.  Abdominal:     General: Abdomen is flat. Bowel sounds are normal. There is no distension.     Palpations: Abdomen is soft.     Tenderness: There is no abdominal tenderness.  Musculoskeletal:        General: Swelling (Trace bilateral lower extremity edema) present. Normal range of motion.     Cervical back: Normal range of motion.     Right lower leg: Edema present.     Left lower leg: Edema present.  Lymphadenopathy:     Cervical: No cervical adenopathy.  Skin:    General: Skin is warm and dry.     Capillary Refill: Capillary refill takes less than 2 seconds.     Coloration: Skin is not jaundiced.  Neurological:     General: No focal deficit present.     Mental Status: She is alert and oriented to person, place, and time.  Psychiatric:        Mood and Affect: Mood normal.        Behavior: Behavior normal.   Last CBC Lab Results  Component Value Date   WBC 7.3 09/29/2023   HGB 11.6 09/29/2023   HCT 33.7 (L) 09/29/2023   MCV 103 (H) 09/29/2023   MCH 35.5 (H) 09/29/2023   RDW 12.3 09/29/2023   PLT 171 09/29/2023   Last metabolic panel Lab Results  Component Value Date   GLUCOSE  131 (H) 09/29/2023   NA 136 09/29/2023   K 3.6 09/29/2023   CL 97 09/29/2023   CO2 26 09/29/2023   BUN 8 09/29/2023   CREATININE 0.63 09/29/2023   EGFR 100 09/29/2023  CALCIUM 8.4 (L) 09/29/2023   PROT 5.9 (L) 09/29/2023   ALBUMIN 3.2 (L) 09/29/2023   LABGLOB 2.7 09/29/2023   AGRATIO 1.1 (L) 05/02/2023   BILITOT 1.2 09/29/2023   ALKPHOS 261 (H) 09/29/2023   AST 64 (H) 09/29/2023   ALT 18 09/29/2023   ANIONGAP 15 02/28/2023   Last lipids Lab Results  Component Value Date   CHOL 211 (H) 09/29/2023   HDL 80 09/29/2023   LDLCALC 109 (H) 09/29/2023   TRIG 125 09/29/2023   CHOLHDL 2.6 09/29/2023   Last hemoglobin A1c Lab Results  Component Value Date   HGBA1C 501.0 (A) 09/28/2023   Last thyroid functions Lab Results  Component Value Date   TSH 4.590 (H) 09/29/2023   Last vitamin D Lab Results  Component Value Date   VD25OH 28.3 (L) 09/29/2023   Last vitamin B12 and Folate Lab Results  Component Value Date   VITAMINB12 1,138 09/29/2023   FOLATE 3.3 09/29/2023   The 10-year ASCVD risk score (Arnett DK, et al., 2019) is: 12.6%    Assessment & Plan:   Problem List Items Addressed This Visit       Cirrhosis (HCC) - Primary    Secondary to alcoholic cirrhosis.  Followed by gastroenterology but is due for follow-up.  She continues to drink alcohol.  Appears well compensated.   -Repeat labs ordered today.  She was again counseled on cessation from alcohol use.  Continue Lasix/Aldactone.      Diabetes mellitus, type II (HCC)    Followed by endocrinology.  A1c remains well within goal.  Reports today that she has restarted Novolin 70/30.      Neuropathy    Symptoms are adequately controlled with gabapentin 600 mg 3 times daily.  No additional changes are indicated today.      Dyslipidemia, goal LDL below 70    Lipid panel last updated in December 2023.  Total cholesterol 204 and LDL 128.  She is currently prescribed Repatha.  Repeat lipid panel ordered today.       Return in about 6 months (around 03/29/2024).   Billie Lade, MD

## 2023-09-29 NOTE — Patient Instructions (Signed)
It was a pleasure to see you today.  Thank you for giving [place] the opportunity to be involved in your care.  Below is a brief recap of your visit and next steps.  We will plan to see you again in 6 months.  Summary No medication changes today Repeat labs ordered Follow up in 6 months

## 2023-09-30 LAB — CBC WITH DIFFERENTIAL/PLATELET
Basophils Absolute: 0.1 10*3/uL (ref 0.0–0.2)
Basos: 1 %
EOS (ABSOLUTE): 0.1 10*3/uL (ref 0.0–0.4)
Eos: 1 %
Hematocrit: 33.7 % — ABNORMAL LOW (ref 34.0–46.6)
Hemoglobin: 11.6 g/dL (ref 11.1–15.9)
Immature Grans (Abs): 0 10*3/uL (ref 0.0–0.1)
Immature Granulocytes: 0 %
Lymphocytes Absolute: 2.8 10*3/uL (ref 0.7–3.1)
Lymphs: 38 %
MCH: 35.5 pg — ABNORMAL HIGH (ref 26.6–33.0)
MCHC: 34.4 g/dL (ref 31.5–35.7)
MCV: 103 fL — ABNORMAL HIGH (ref 79–97)
Monocytes Absolute: 0.7 10*3/uL (ref 0.1–0.9)
Monocytes: 9 %
Neutrophils Absolute: 3.7 10*3/uL (ref 1.4–7.0)
Neutrophils: 51 %
Platelets: 171 10*3/uL (ref 150–450)
RBC: 3.27 x10E6/uL — ABNORMAL LOW (ref 3.77–5.28)
RDW: 12.3 % (ref 11.7–15.4)
WBC: 7.3 10*3/uL (ref 3.4–10.8)

## 2023-09-30 LAB — CMP14+EGFR
ALT: 18 [IU]/L (ref 0–32)
AST: 64 [IU]/L — ABNORMAL HIGH (ref 0–40)
Albumin: 3.2 g/dL — ABNORMAL LOW (ref 3.9–4.9)
Alkaline Phosphatase: 261 [IU]/L — ABNORMAL HIGH (ref 44–121)
BUN/Creatinine Ratio: 13 (ref 12–28)
BUN: 8 mg/dL (ref 8–27)
Bilirubin Total: 1.2 mg/dL (ref 0.0–1.2)
CO2: 26 mmol/L (ref 20–29)
Calcium: 8.4 mg/dL — ABNORMAL LOW (ref 8.7–10.3)
Chloride: 97 mmol/L (ref 96–106)
Creatinine, Ser: 0.63 mg/dL (ref 0.57–1.00)
Globulin, Total: 2.7 g/dL (ref 1.5–4.5)
Glucose: 131 mg/dL — ABNORMAL HIGH (ref 70–99)
Potassium: 3.6 mmol/L (ref 3.5–5.2)
Sodium: 136 mmol/L (ref 134–144)
Total Protein: 5.9 g/dL — ABNORMAL LOW (ref 6.0–8.5)
eGFR: 100 mL/min/{1.73_m2} (ref >59)

## 2023-09-30 LAB — PROTIME-INR
INR: 1.2 (ref 0.9–1.2)
Prothrombin Time: 13 s — ABNORMAL HIGH (ref 9.1–12.0)

## 2023-09-30 LAB — VITAMIN D 25 HYDROXY (VIT D DEFICIENCY, FRACTURES): Vit D, 25-Hydroxy: 28.3 ng/mL — ABNORMAL LOW (ref 30.0–100.0)

## 2023-09-30 LAB — LIPID PANEL
Chol/HDL Ratio: 2.6 {ratio} (ref 0.0–4.4)
Cholesterol, Total: 211 mg/dL — ABNORMAL HIGH (ref 100–199)
HDL: 80 mg/dL (ref >39)
LDL Chol Calc (NIH): 109 mg/dL — ABNORMAL HIGH (ref 0–99)
Triglycerides: 125 mg/dL (ref 0–149)
VLDL Cholesterol Cal: 22 mg/dL (ref 5–40)

## 2023-09-30 LAB — MAGNESIUM: Magnesium: 1.4 mg/dL — ABNORMAL LOW (ref 1.6–2.3)

## 2023-09-30 LAB — AFP TUMOR MARKER: AFP, Serum, Tumor Marker: 7.1 ng/mL (ref 0.0–9.2)

## 2023-09-30 LAB — B12 AND FOLATE PANEL
Folate: 3.3 ng/mL (ref >3.0)
Vitamin B-12: 1138 pg/mL (ref 232–1245)

## 2023-09-30 LAB — TSH+FREE T4
Free T4: 1.34 ng/dL (ref 0.82–1.77)
TSH: 4.59 u[IU]/mL — ABNORMAL HIGH (ref 0.450–4.500)

## 2023-10-06 ENCOUNTER — Telehealth: Payer: Self-pay | Admitting: *Deleted

## 2023-10-06 ENCOUNTER — Encounter: Payer: Self-pay | Admitting: Internal Medicine

## 2023-10-06 IMAGING — US US ABDOMEN LIMITED
1 series · 14 of 25 positions shown · non-contrast
Comparison: 06/17/2015

CLINICAL DATA: Cirrhosis

EXAM:
ULTRASOUND ABDOMEN LIMITED RIGHT UPPER QUADRANT

[Series 1: us abdomen limited ruq (liver/gb) · 14 of 31 slices shown]
[im 1/31]
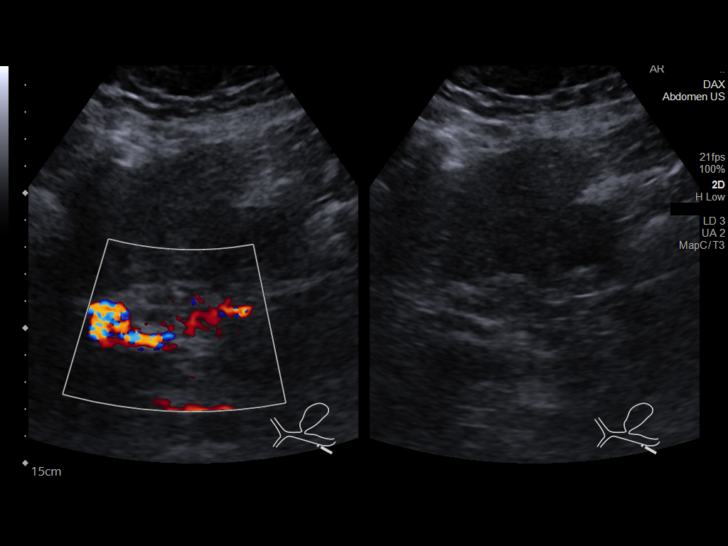
[im 3/31]
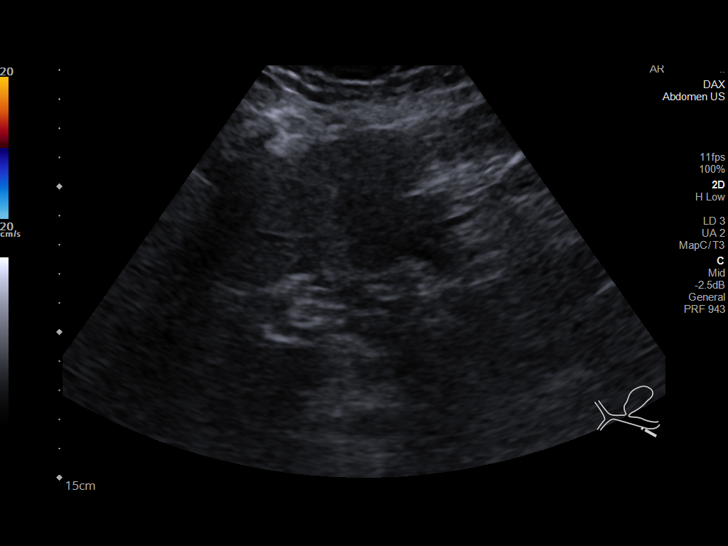
[im 6/31]
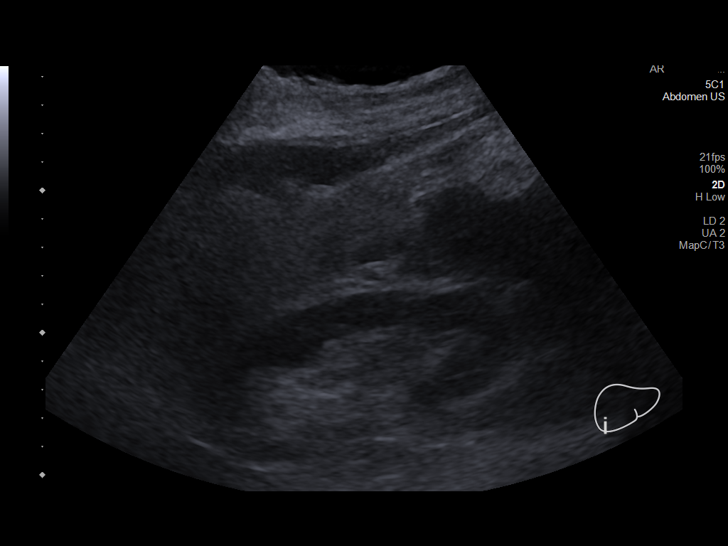
[im 8/31]
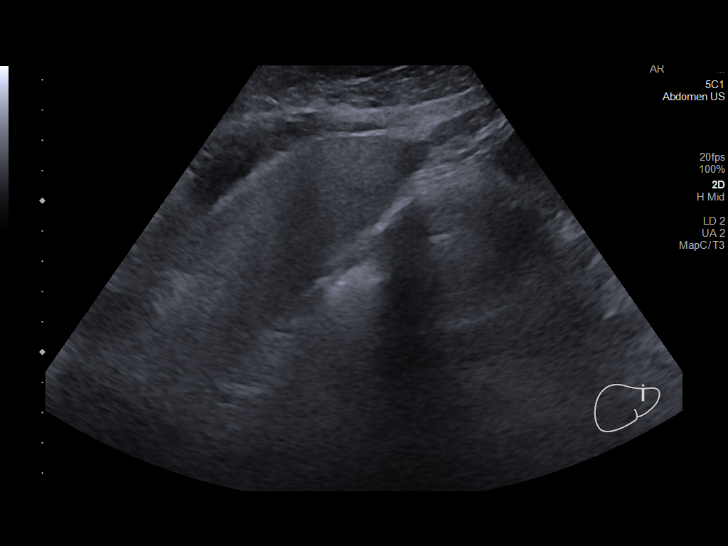
[im 11/31]
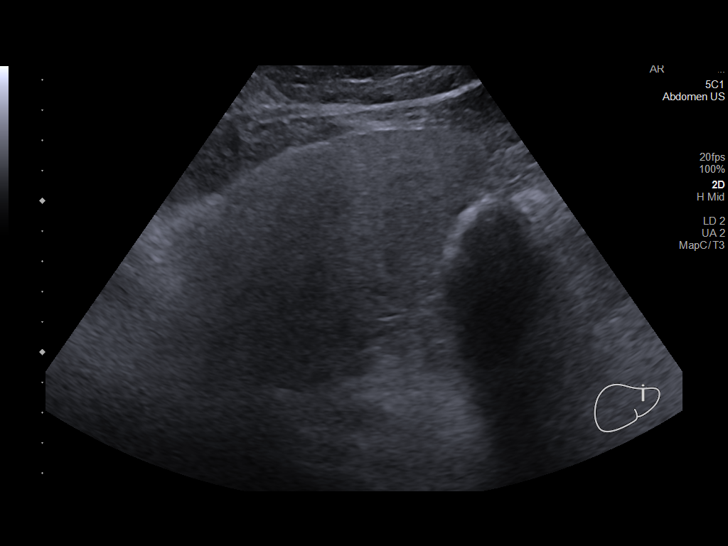
[im 12/31]
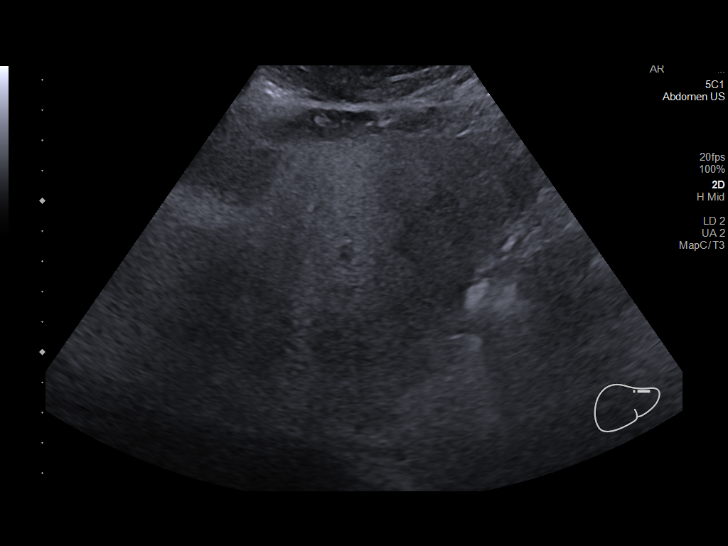
[im 14/31]
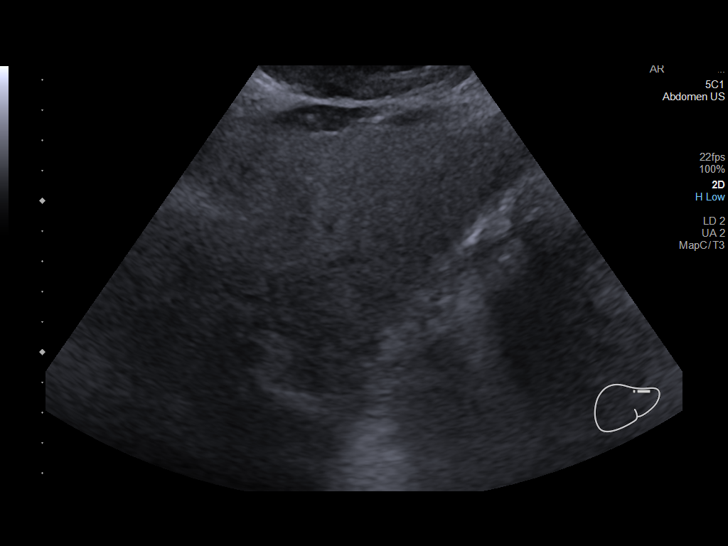
[im 17/31]
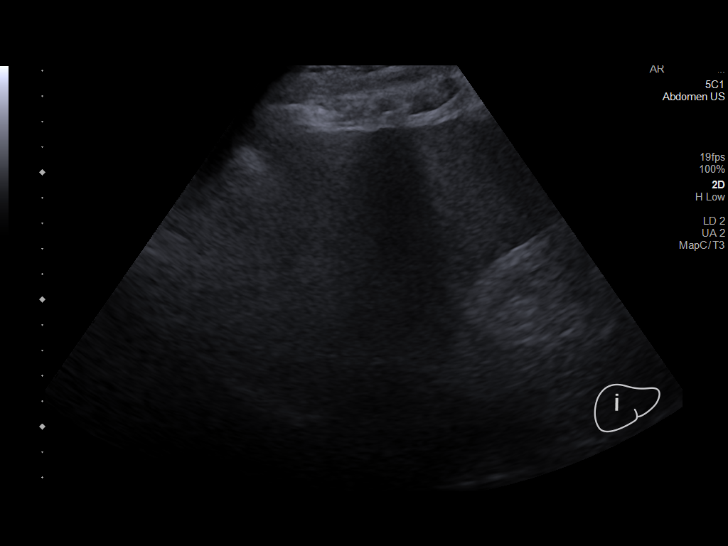
[im 19/31]
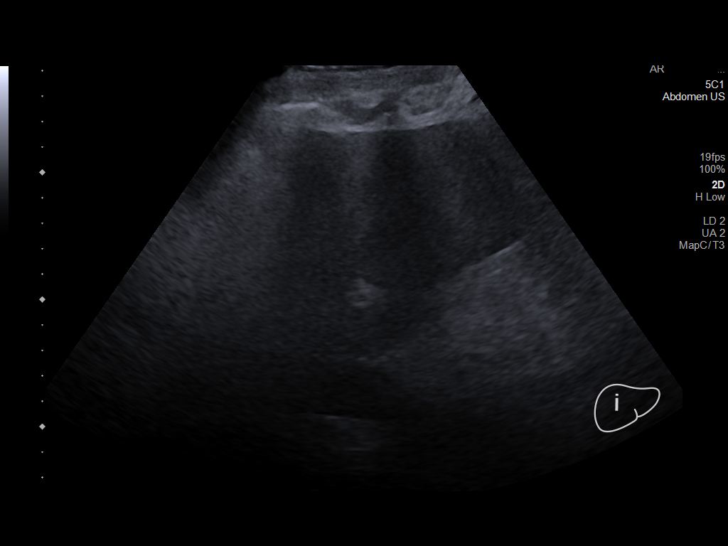
[im 21/31]
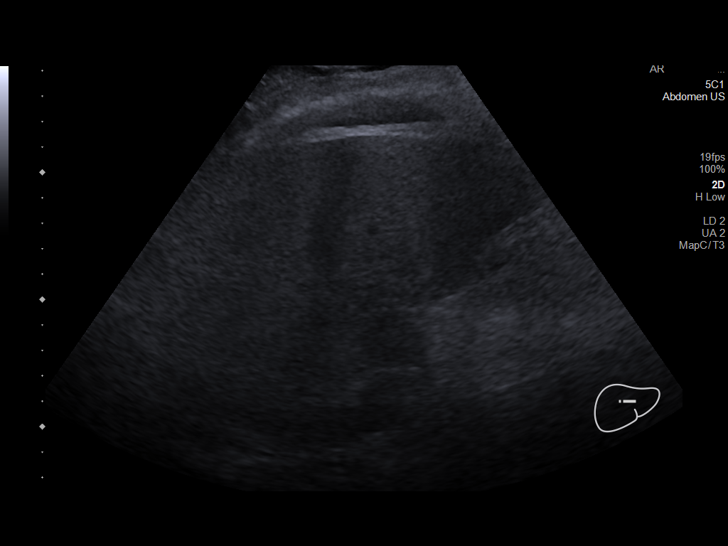
[im 23/31]
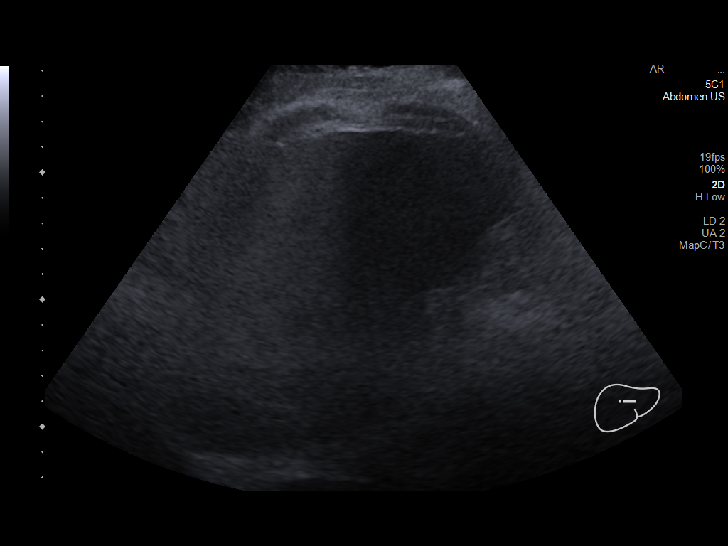
[im 26/31]
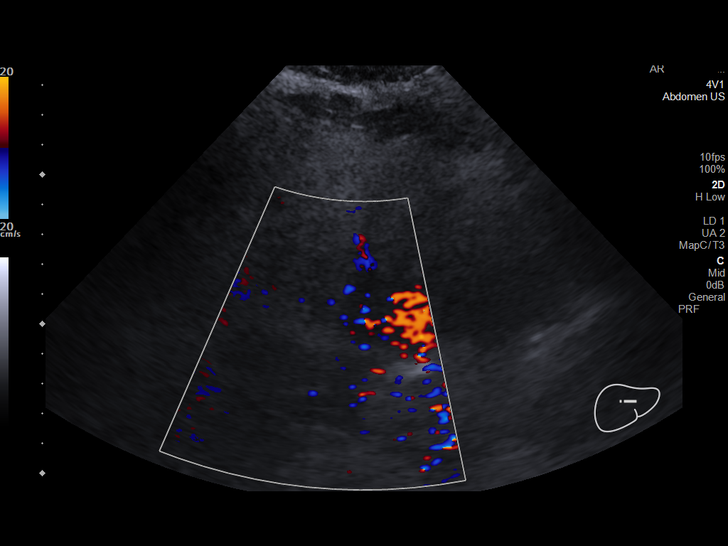
[im 28/31]
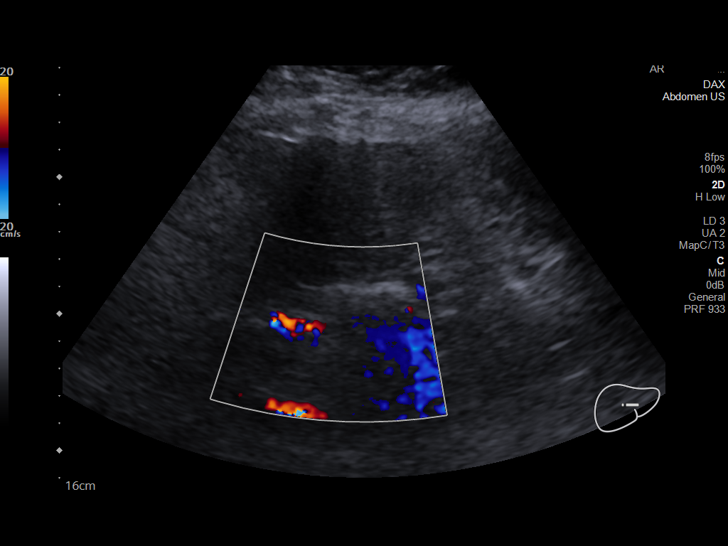
[im 31/31]
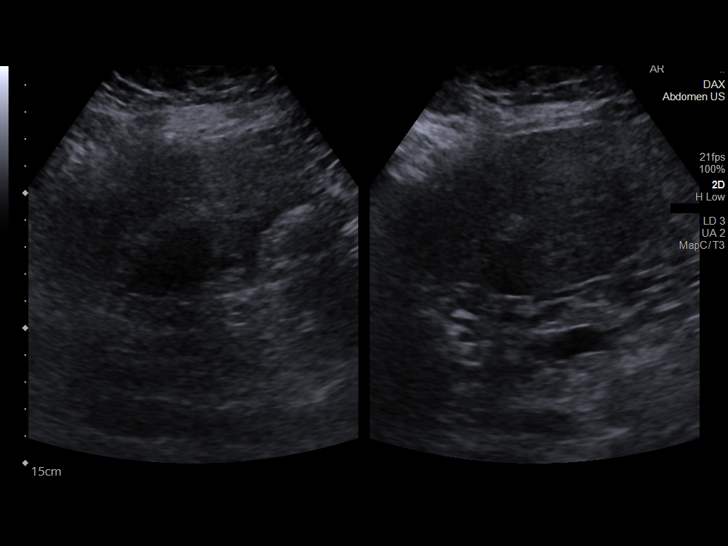

[14 of 25 positions shown; findings below may reference images not displayed]

FINDINGS: Gallbladder:

Surgically removed

Common bile duct:

Diameter: 4 mm

Liver:

Diffusely increased in echogenicity. Some lobular changes are noted
consistent with underlying cirrhosis. Portal vein is patent on color
Doppler imaging with normal direction of blood flow towards the
liver.

Other: None.
IMPRESSION: Cirrhotic changes of the liver.  No acute abnormality noted.

Status post cholecystectomy.

## 2023-10-06 IMAGING — US US HEPATIC LIVER DOPPLER
1 series · 14 of 25 positions shown · non-contrast
Comparison: 05/31/2022

CLINICAL DATA: Cirrhosis

EXAM:
DUPLEX ULTRASOUND OF LIVER
TECHNIQUE: Color and duplex Doppler ultrasound was performed to evaluate the
hepatic in-flow and out-flow vessels.

[Series 1: us liver doppler limited (pv or single art/vein) · portal-venous · 14 of 27 slices shown]
[im 1/27]
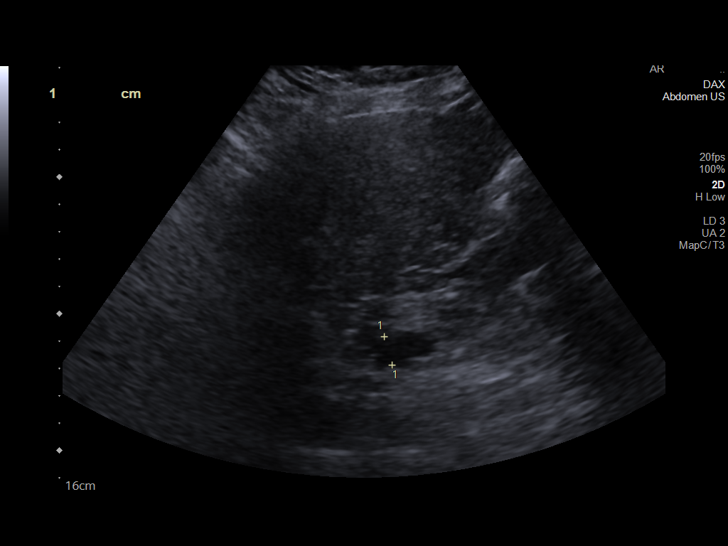
[im 3/27]
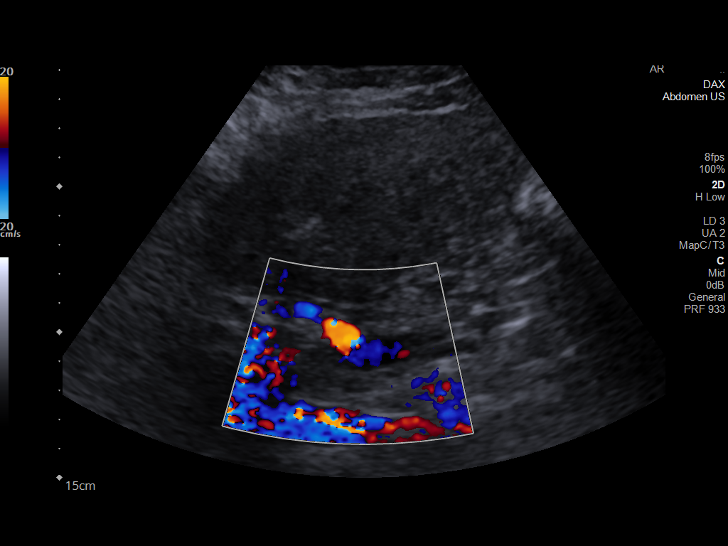
[im 5/27]
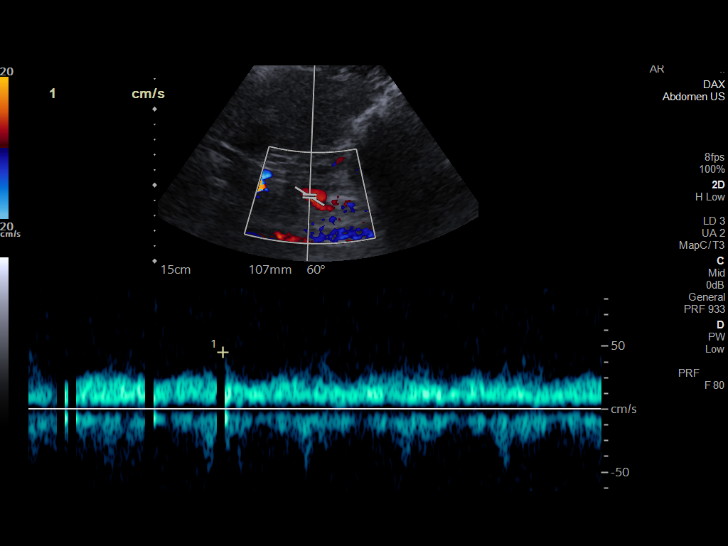
[im 7/27]
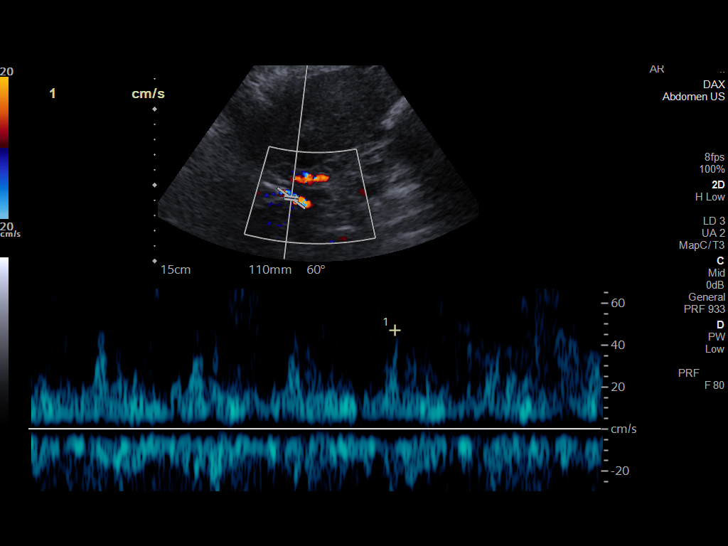
[im 9/27]
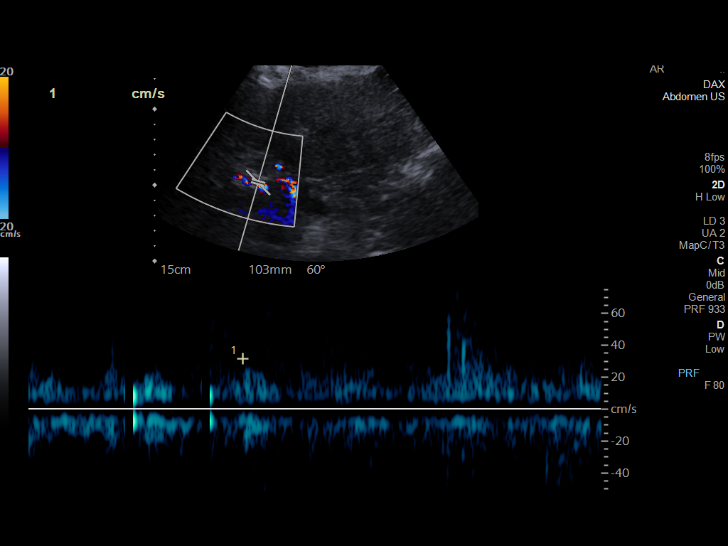
[im 10/27]
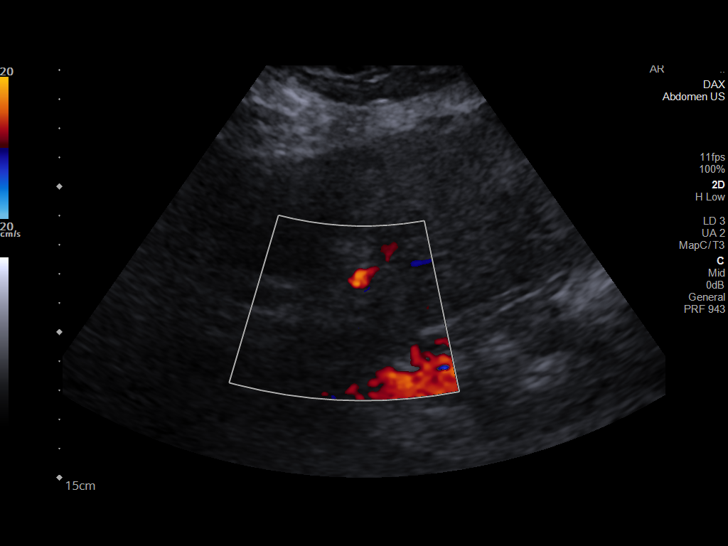
[im 12/27]
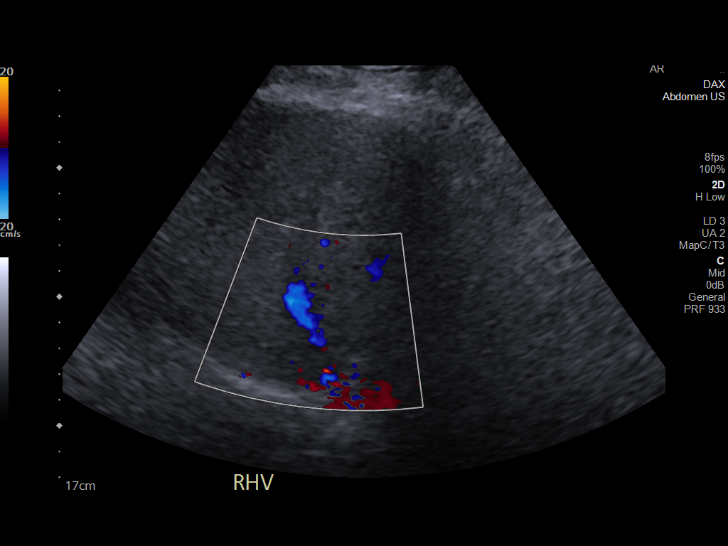
[im 15/27]
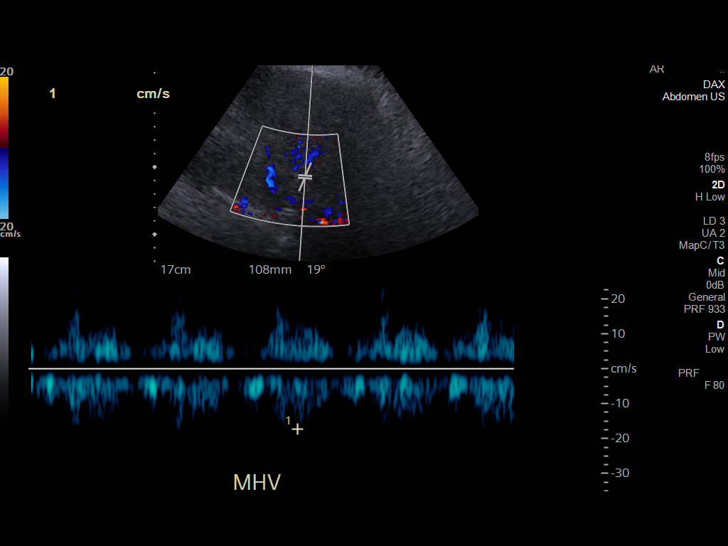
[im 17/27]
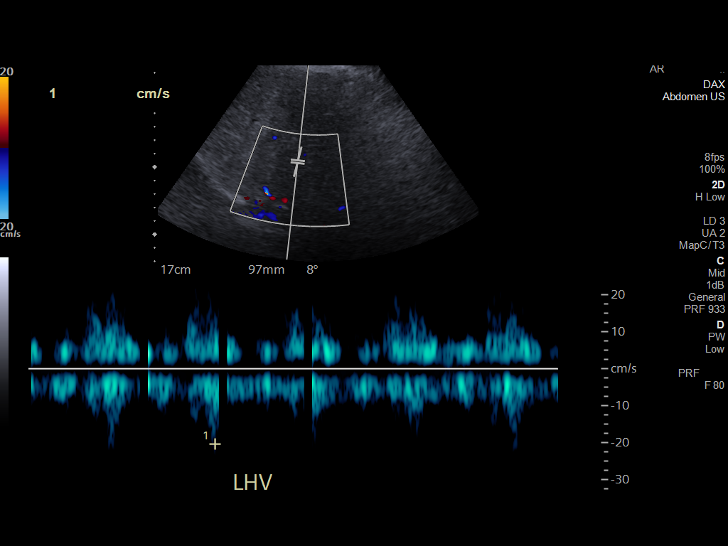
[im 18/27]
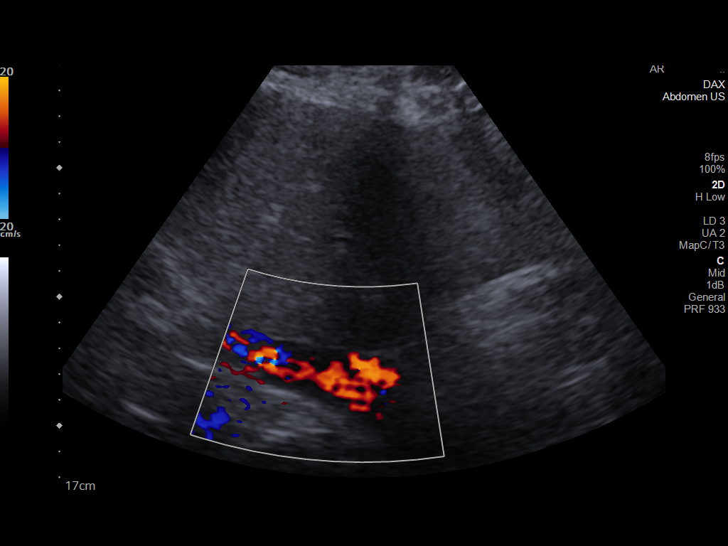
[im 20/27]
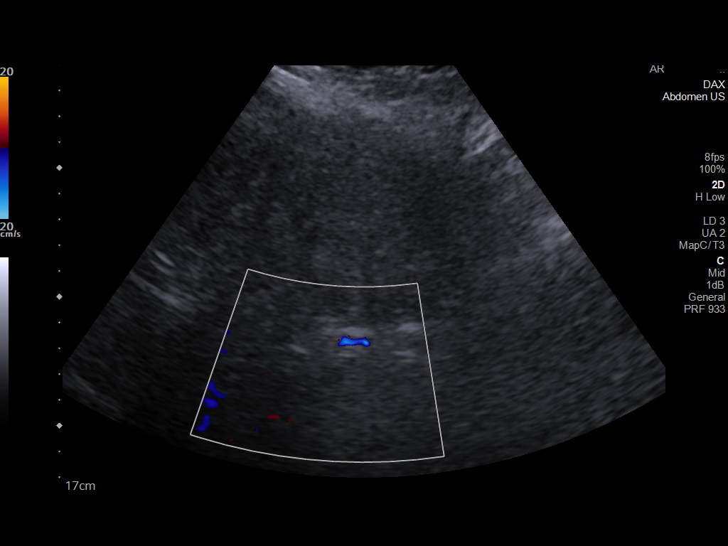
[im 22/27]
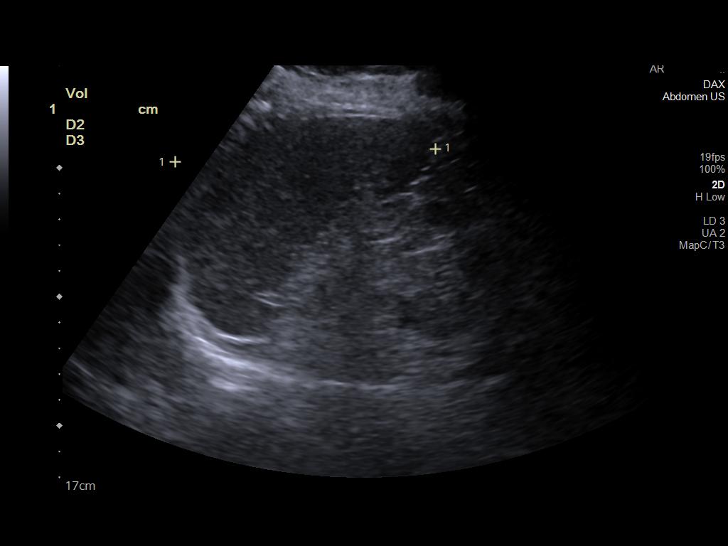
[im 24/27]
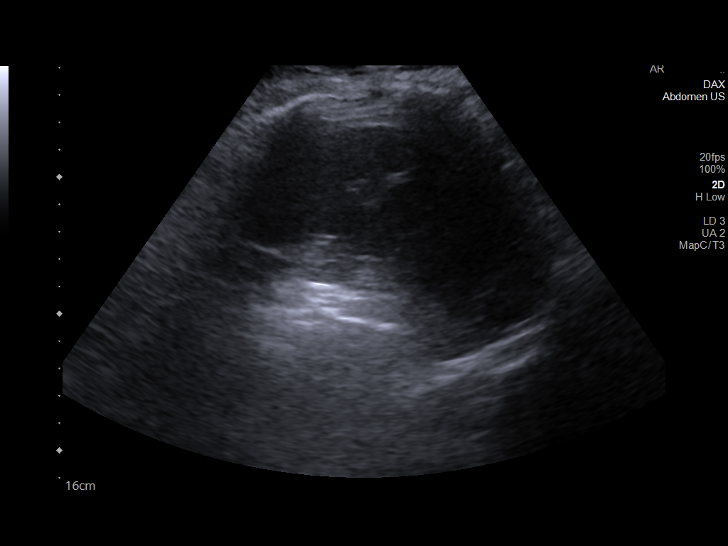
[im 27/27]
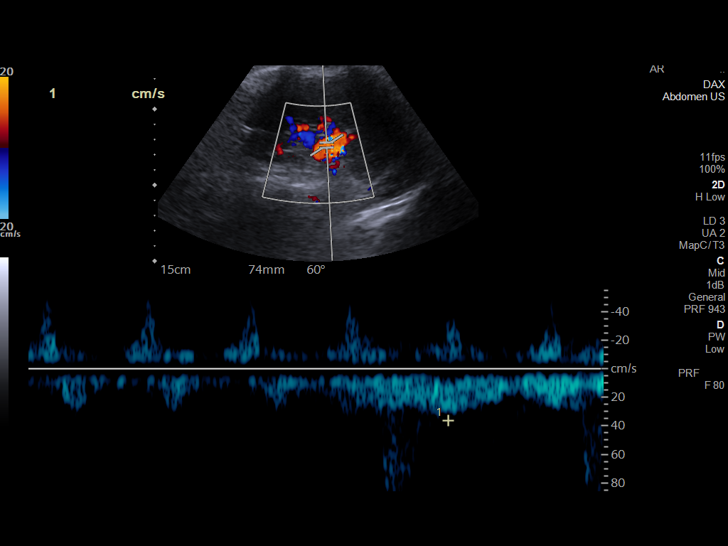

[14 of 25 positions shown; findings below may reference images not displayed]

FINDINGS: Liver: Heterogeneous increased echogenicity surface nodularity

No focal lesion, mass or intrahepatic biliary ductal dilatation.

Main Portal Vein size: 1.1 cm

Portal Vein Velocities

Main Prox:  45 cm/sec

Main Mid: 46 cm/sec

Main Dist:  47 cm/sec
Right: 32 cm/sec
Left: 61 cm/sec

Hepatic Vein Velocities

Right:  22 cm/sec

Middle:  17 cm/sec

Left:  20 cm/sec

IVC: Present and patent with normal respiratory phasicity.

Hepatic Artery Velocity:  81 cm/sec

Splenic Vein Velocity:  36 cm/sec

Spleen: 10.1 cm x 5.4 cm x 11.8 cm with a total volume of 335 cm^3
(411 cm^3 is upper limit normal)

Portal Vein Occlusion/Thrombus: No

Splenic Vein Occlusion/Thrombus: No

Ascites: None

Varices: None

Patent portal, hepatic and splenic veins with normal directional
flow. Negative for portal vein occlusion or thrombus. No ascites.
Hepatic cirrhosis evident.
IMPRESSION: Ultrasound evidence of hepatic cirrhosis.

Normal hepatic venous Doppler.

## 2023-10-06 NOTE — Telephone Encounter (Signed)
Patient left a voicemail. She states that she does not understand why we sent if the [facility] that she has to stick her finger with, as she could get a meter from pharmacy cheaper. She has had to pay $100. The patient at the time of her office visit ask that we sent in for the [employer] as she and [facility] had a disagreement on the Dexcom.  Patient was called, and a message was left asking her to call back so that we may understand what is going on.

## 2023-10-06 NOTE — Telephone Encounter (Signed)
Patient left a another message she states that she is not satisfied with the care received here and she is going to find another physician. Alphonzo Lemmings was made aware and heard the voicemail message.

## 2023-10-14 ENCOUNTER — Other Ambulatory Visit: Payer: Self-pay | Admitting: Internal Medicine

## 2023-10-14 DIAGNOSIS — K7030 Alcoholic cirrhosis of liver without ascites: Secondary | ICD-10-CM

## 2023-10-17 ENCOUNTER — Encounter: Payer: Self-pay | Admitting: Gastroenterology

## 2023-10-17 ENCOUNTER — Encounter: Payer: Self-pay | Admitting: Internal Medicine

## 2023-10-17 ENCOUNTER — Ambulatory Visit (INDEPENDENT_AMBULATORY_CARE_PROVIDER_SITE_OTHER): Payer: Medicare HMO | Admitting: Gastroenterology

## 2023-10-17 VITALS — BP 104/64 | HR 103 | Temp 98.5°F | Ht 65.0 in | Wt 154.8 lb

## 2023-10-17 DIAGNOSIS — F1090 Alcohol use, unspecified, uncomplicated: Secondary | ICD-10-CM

## 2023-10-17 DIAGNOSIS — K7030 Alcoholic cirrhosis of liver without ascites: Secondary | ICD-10-CM

## 2023-10-17 DIAGNOSIS — K2100 Gastro-esophageal reflux disease with esophagitis, without bleeding: Secondary | ICD-10-CM

## 2023-10-17 DIAGNOSIS — E876 Hypokalemia: Secondary | ICD-10-CM

## 2023-10-17 DIAGNOSIS — K3189 Other diseases of stomach and duodenum: Secondary | ICD-10-CM

## 2023-10-17 NOTE — Progress Notes (Signed)
GI Office Note    Referring Provider: Billie Lade, MD Primary Care Physician:  Billie Lade, MD Primary Gastroenterologist: Hennie Duos. Marletta Lor, DO  Date:  10/17/2023  ID:  Michelle Michael, DOB 1961/02/10, MRN [id number]   Chief Complaint   Chief Complaint  Patient presents with   Follow-up    Follow up on [place]. Pt states she feels better   History of Present Illness  Michelle Michael is a 62 y.o. female with a history of CAD, anxiety/depression, type 2 diabetes on insulin, GERD, HTN, anemia, alcoholic cirrhosis presenting today for follow-up.  Colonoscopy February 2021: 2 polyps in the sigmoid colon, tubular adenomas.    EGD in February 2021: Nonbleeding cratered duodenal ulcer without stigmata of bleeding and second portion of duodenum, gastritis with moderate chronic inactive gastritis and intestinal metaplasia, negative H. 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   Prior cirrhosis workup revealed negative AMA, ANA, viral hepatitis.  ASMA slightly elevated 22, likely secondary to alcohol use.  aFP in November 2021: 4.6   Right upper quadrant ultrasound 12/16/2022: -Reversal of blood flow in the portal vein consistent with portal venous hypertension -Cirrhotic morphology of liver -No liver lesion   EGD 02/10/23: -No evidence of varices -grade B reflux esophagitis -non severe candidiasis esophagitis, KOH prep negative. -gastritis s/p biopsy (reactive gastropathy and findings consistent with portal hypertensive gastropathy), negative H. pylori. -normal duodenum -use PPI BID -repeat EGD in 2 years  Last office visit 05/11/2023.  Not adhering to 2 g sodium diet but was working on eating more green foods.  Admitted to ongoing alcohol use with a 6 ounce glass of wine 2-3 times per week especially with stressful events.  Was on Repatha and having increased blood sugars, due to follow-up with endocrinology.  Recently had her Lasix  increased to 40 mg twice daily as well as increased her potassium supplementation given hypokalemia.  Having to use a cane given her swelling.  No longer having shortness of breath.  Ordered right upper quadrant ultrasound for next month and advise labs in 6 months with AFP.  Advised to continue Lasix 40 twice daily and Aldactone 100 mg daily.  Continue pantoprazole 40 mg twice daily.  Today:  Cirrhosis history Hematemesis/coffee ground emesis: none History of variceal bleeding: none Abdominal pain: nothing overt, sometimes sharp pain to RUQ lasting 1 minute, happen couple times per week.  Abdominal distention/worsening ascites: none Fever/chills: None Episodes of confusion/disorientation: None.  Number of daily bowel movements: 1-2 BM daily.  Taking diuretics?:  Was having peripheral edema, Dixon increased her lasix to 40 mg BID. Now taking more potassium daily. Not needing her cane anymore. Intermittent swelling that improves with elevation.  Date of last EGD: February 2024 - no varices. Due for surveillance  Prior history of banding?: no Prior episodes of SBP: no Last time liver imaging was performed: 06/15/23 -portal hypertension, no liver lesion identified  No pruritis or jaundice. Reflux has been good. Not much a much nausea as she used to. Keeps Zofran close by.   Has been having tremors about a month ago - they are off and on. She states it tends to happen when she gets anxious.   Tobacco use: less than 1 ppd.   MELD 3.0: 11 at 09/29/2023  3:52 PM MELD-Na: 9 at 09/29/2023  3:52 PM Calculated from: Serum Creatinine: 0.63 mg/dL (Using min of 1 mg/dL) at 30/07/6577  4:69 PM Serum Sodium: 136 mmol/L at 09/29/2023  3:52 PM Total Bilirubin: 1.2 mg/dL at 84/12/6604  3:01 PM Serum Albumin: 3.2 g/dL at 60/12/930  3:55 PM INR(ratio): 1.2 at 09/29/2023  3:52 PM Age at listing (hypothetical): 62 years Sex: Female at 09/29/2023  3:52 PM  Has lots of cramping in her muscles, forearm, leg, etc.  Uses otc muscle cramping. Started on magnesium supplements.   Did drink some alcohol about 2 weeks ago (2-3 mixed drinks). Same when she went on vacation in September.   Current Outpatient Medications  Medication Sig Dispense Refill   busPIRone (BUSPAR) 10 MG tablet Take 10 mg by mouth 3 (three) times daily.     Continuous Glucose Sensor (FREESTYLE LIBRE 3 PLUS SENSOR) MISC Change sensor every 15 days. 6 each 3   DULoxetine (CYMBALTA) 30 MG capsule Take 30 mg by mouth every morning.     fluticasone (FLONASE) 50 MCG/ACT nasal spray Use 2 spray(s) in each nostril once daily 16 g 0   folic acid (FOLVITE) 400 MCG tablet Take 400 mcg by mouth daily.     furosemide (LASIX) 40 MG tablet Take 1 tablet by mouth twice daily 60 tablet 0   gabapentin (NEURONTIN) 300 MG capsule Take 1 capsule (300 mg total) by mouth daily as needed (nerve pain). 90 capsule 1   gabapentin (NEURONTIN) 600 MG tablet TAKE 1 TABLET BY MOUTH THREE TIMES DAILY 180 tablet 0   Glucagon, rDNA, (GLUCAGON EMERGENCY) 1 MG KIT Inject 1 mg into the muscle as needed (low blood sugar).     insulin isophane & regular human KwikPen (NOVOLIN 70/30 KWIKPEN) (70-30) 100 UNIT/ML KwikPen Inject 15 Units into the skin in the morning and at bedtime. 25 mL 3   Insulin Pen Needle (BD PEN NEEDLE NANO 2ND GEN) 32G X 4 MM MISC Use to inject insulin and Victoza for 3 injections per day 1000 each 6   INSULIN SYRINGE .5CC/29G 29G X 1/2" 0.5 ML MISC Use to inject insulin twice daily. 100 each 3   liraglutide (VICTOZA) 18 MG/3ML SOPN Inject 1.8 mg into the skin daily. 27 mL 3   metFORMIN (GLUCOPHAGE-XR) 500 MG 24 hr tablet Take 1 tablet (500 mg total) by mouth daily with breakfast. 90 tablet 3   ondansetron (ZOFRAN) 4 MG tablet TAKE 1 TABLET BY MOUTH EVERY 8 HOURS AS NEEDED 20 tablet 0   pantoprazole (PROTONIX) 40 MG tablet Take 1 tablet (40 mg total) by mouth 2 (two) times daily. 60 tablet 11   Potassium Chloride 40 MEQ/15ML (20%) SOLN TAKE  7.5 ML BY MOUTH  DAILY 473 mL 0   REPATHA 140 MG/ML SOSY INJECT 140 MG INTO THE SKIN EVERY 14 DAYS 2 mL 0   spironolactone (ALDACTONE) 100 MG tablet Take 100 mg by mouth daily.     STIOLTO RESPIMAT 2.5-2.5 MCG/ACT AERS INHALE 2 PUFFS BY MOUTH ONCE DAILY 4 g 0   traZODone (DESYREL) 50 MG tablet Take 1 tablet (50 mg total) by mouth at bedtime as needed for sleep. 30 tablet 0   VENTOLIN HFA 108 (90 Base) MCG/ACT inhaler INHALE 2 PUFFS BY MOUTH EVERY 6 HOURS AS NEEDED FOR WHEEZING FOR SHORTNESS OF BREATH 18 g 0   No current facility-administered medications for this visit.    Past Medical History:  Diagnosis Date   Anemia    Anxiety    Centrilobular emphysema (HCC) 03/22/2023   Cirrhosis (HCC)    CKD (chronic kidney disease)    Depression    Diabetes mellitus, type II (HCC)  GERD (gastroesophageal reflux disease)    Hyperlipidemia    Hypertension    Hypokalemia    Hyponatremia    Neuromuscular disorder (HCC) 2021   Substance abuse (HCC)    Years ago   Ulcer 1980s    Past Surgical History:  Procedure Laterality Date   BIOPSY  02/10/2023   Procedure: BIOPSY;  Surgeon: Lanelle Bal, DO;  Location: [facility];  Service: Endoscopy;;   CESAREAN SECTION  1984   CHOLECYSTECTOMY     ESOPHAGEAL BRUSHING  02/10/2023   Procedure: ESOPHAGEAL BRUSHING;  Surgeon: Lanelle Bal, DO;  Location: [facility];  Service: Endoscopy;;   ESOPHAGOGASTRODUODENOSCOPY (EGD) WITH PROPOFOL N/A 02/10/2023   Procedure: ESOPHAGOGASTRODUODENOSCOPY (EGD) WITH PROPOFOL;  Surgeon: Lanelle Bal, DO;  Location: [facility];  Service: Endoscopy;  Laterality: N/A;  8:45 am   FRACTURE SURGERY  2005   ORTHOPEDIC SURGERY     SPINE SURGERY  2003   TUBAL LIGATION  1993    Family History  Problem Relation Age of Onset   Diabetes Mother    Hypertension Mother    Alcohol abuse Mother    Cancer Mother    Hypertension Father    Arthritis Father    Heart disease Father    Cancer Daughter    Obesity Daughter     Drug abuse Son    Breast cancer Neg Hx     Allergies as of 10/17/2023   (No Known Allergies)    Social History   Socioeconomic History   Marital status: Divorced    Spouse name: Not on file   Number of children: 2   Years of education: 12   Highest education level: Associate degree: occupational, Scientist, product/process development, or vocational program  Occupational History   Not on file  Tobacco Use   Smoking status: Every Day    Current packs/day: 1.00    Average packs/day: 1 pack/day for 40.0 years (40.0 ttl pk-yrs)    Types: Cigarettes    Passive exposure: Current   Smokeless tobacco: Never   Tobacco comments:    Smoking Cessation Classes Offered.  Vaping Use   Vaping status: Former  Substance and Sexual Activity   Alcohol use: Yes    Alcohol/week: 10.0 standard drinks of alcohol    Types: 10 Glasses of wine per week    Comment: occassional wine- 2-3 times weekly   Drug use: Never   Sexual activity: Not Currently    Partners: Male    Birth control/protection: Abstinence, Post-menopausal, None  Other Topics Concern   Not on file  Social History Narrative   Lives with her room mate    Social Determinants of Health   Financial Resource Strain: Low Risk  (03/29/2023)   Overall Financial Resource Strain (CARDIA)    Difficulty of Paying Living Expenses: Not hard at all  Food Insecurity: No Food Insecurity (03/29/2023)   Hunger Vital Sign    Worried About Running Out of Food in the Last Year: Never true    Ran Out of Food in the Last Year: Never true  Transportation Needs: Unmet Transportation Needs (03/29/2023)   PRAPARE - Transportation    Lack of Transportation (Medical): Yes    Lack of Transportation (Non-Medical): Yes  Physical Activity: Inactive (01/16/2023)   Exercise Vital Sign    Days of Exercise per Week: 0 days    Minutes of Exercise per Session: 0 min  Stress: Stress Concern Present (01/16/2023)   [employer] of Occupational Health - Occupational  Stress Questionnaire     Feeling of Stress : Rather much  Social Connections: Socially Isolated (03/29/2023)   Social Connection and Isolation Panel [NHANES]    Frequency of Communication with Friends and Family: Once a week    Frequency of Social Gatherings with Friends and Family: Once a week    Attends Religious Services: Never    Database administrator or Organizations: No    Attends Engineer, structural: Never    Marital Status: Divorced   Review of Systems   Gen: Denies fever, chills, anorexia. Denies fatigue, weakness, weight loss.  CV: Denies chest pain, palpitations, syncope, peripheral edema, and claudication. Resp: Denies dyspnea at rest, cough, wheezing, coughing up blood, and pleurisy. GI: See HPI Derm: Denies rash, itching, dry skin Psych: Denies depression, anxiety, memory loss, confusion. No homicidal or suicidal ideation.  Heme: Denies bruising, bleeding, and enlarged lymph nodes.  Physical Exam   BP 104/64   Pulse (!) 103   Temp 98.5 F (36.9 C)   Ht 5\' 5"  (1.651 m)   Wt 154 lb 12.8 oz (70.2 kg)   BMI 25.76 kg/m   General:   Alert and oriented. No distress noted. Pleasant and cooperative. Intermittent facial tremor. Head:  Normocephalic and atraumatic. Eyes:  Conjuctiva clear without scleral icterus. Mouth:  Oral mucosa pink and moist. Good dentition. No lesions. Lungs:  Clear to auscultation bilaterally. No wheezes, rales, or rhonchi. No distress.  Heart:  S1, S2 present without murmurs appreciated.  Abdomen:  +BS, soft, non-tender and non-distended. Mild hepatomegaly.  No rebound or guarding. No HSM or masses noted.  Scattered bruising likely from insulin injections Rectal: deferred  Msk:  Symmetrical without gross deformities. Normal posture. Extremities:  Without edema. Neurologic:  Alert and oriented x4.  Tremors to face and bilateral hands at rest.  No asterixis.  Psych:  Alert and cooperative. Normal mood and affect.  Assessment  Michelle Michael is a 62 y.o. female  with a history of CAD, anxiety/depression, type 2 diabetes on insulin, GERD, HTN, anemia, alcoholic cirrhosis presenting today for follow-up.  Alcoholic cirrhosis: Continues to have some alcohol intake.  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  Swelling controlled with Lasix 40 twice daily and Aldactone 100 mg daily.  Recent EGD without varices but portal hypertensive gastropathy.  No evidence of thrombocytopenia or splenomegaly on imaging.  AST remains elevated while other LFTs are normal.  Due for hepatoma screening in December.  Recent AFP within normal limits but 2 point increase from prior.  Continue to recommend alcohol cessation and adhering to 2 g sodium diet.  No asterixis today.  No evidence of hepatic encephalopathy however does have some tremors.  Query whether or not this is related to combination antianxiety/antidepressant.  Will notify Dr. Durwin Nora.  GERD, reflux esophagitis: Prior EGD with reflux esophagitis and concern for Candida esophagitis with negative KOH prep.  Reflux fairly well-controlled with pantoprazole 40 mg twice daily.  Does have some occasional nausea but recently no vomiting.  No dysphagia.  Hypokalemia and hypomagnesemia: Continue replacements as directed by Dr. Durwin Nora.  Likely secondary to dietary intake and effective diuretics.  PLAN   RUQ [place] late December 2024 MELD labs in 6 months Continue pantoprazole 40 mg twice daily Continue spironolactone 100 mg daily and Lasix 40 mg twice daily Alcohol cessation 2 g sodium diet, high-protein diet Continue potassium and magnesium supplementation. If ongoing tremors over the neck several days, advised to  reach back out to Dr. Durwin Nora.  Will make him aware of my findings today. Follow-up 6 months   Brooke Bonito, MSN, FNP-BC, AGACNP-BC [facility]

## 2023-10-17 NOTE — Assessment & Plan Note (Signed)
Symptoms are adequately controlled with gabapentin 600 mg 3 times daily.  No additional changes are indicated today.

## 2023-10-17 NOTE — Patient Instructions (Addendum)
Cirrhosis Lifestyle Recommendations:  High-protein diet from a primarily plant-based diet. Avoid red meat.  No raw or undercooked meat, seafood, or shellfish. Low-fat/cholesterol/carbohydrate diet. Limit sodium to no more than 2000 mg/day including everything that you eat and drink. Recommend at least 30 minutes of aerobic and resistance exercise 3 days/week. Limit Tylenol to 2000 mg daily.  Continue to recommend complete alcohol cessation to prevent further progression of cirrhosis  You are due for your ultrasound of your liver in December of this year.  We will recheck your liver labs in 6 months.  Continue Lasix 40 mg twice daily and Aldactone 100 mg daily.  Continue pantoprazole 40 mg twice daily.  Follow-up in 6 months, sooner if needed.  It was a pleasure to see you today. I want to create trusting relationships with patients. If you receive a survey regarding your visit,  I greatly appreciate you taking time to fill this out on paper or through your MyChart. I value your feedback.  Brooke Bonito, MSN, FNP-BC, AGACNP-BC [facility]

## 2023-10-17 NOTE — Assessment & Plan Note (Signed)
Secondary to alcoholic cirrhosis.  Followed by gastroenterology but is due for follow-up.  She continues to drink alcohol.  Appears well compensated.   -Repeat labs ordered today.  She was again counseled on cessation from alcohol use.  Continue Lasix/Aldactone.

## 2023-10-17 NOTE — Assessment & Plan Note (Signed)
Followed by endocrinology.  A1c remains well within goal.  Reports today that she has restarted Novolin 70/30.

## 2023-10-17 NOTE — Assessment & Plan Note (Signed)
Lipid panel last updated in December 2023.  Total cholesterol 204 and LDL 128.  She is currently prescribed Repatha.  Repeat lipid panel ordered today.

## 2023-10-22 ENCOUNTER — Other Ambulatory Visit: Payer: Self-pay | Admitting: Internal Medicine

## 2023-10-22 DIAGNOSIS — J432 Centrilobular emphysema: Secondary | ICD-10-CM

## 2023-10-23 ENCOUNTER — Telehealth: Payer: Self-pay | Admitting: Internal Medicine

## 2023-10-23 NOTE — Telephone Encounter (Signed)
Patient called in regard to AWV on 01/17/23 with Barbaraann Faster  Has received a bill in regard to visit insurance did not cover stating patient needed referral to provider for visit.  Provider was a pcp in patient primary care office  Patient spoke with [facility] billing and was advised to reach out to office.  Patient wants a call back.

## 2023-10-24 ENCOUNTER — Other Ambulatory Visit: Payer: Self-pay | Admitting: Internal Medicine

## 2023-10-24 DIAGNOSIS — Z5309 Procedure and treatment not carried out because of other contraindication: Secondary | ICD-10-CM

## 2023-10-24 DIAGNOSIS — Z9189 Other specified personal risk factors, not elsewhere classified: Secondary | ICD-10-CM

## 2023-10-24 DIAGNOSIS — E785 Hyperlipidemia, unspecified: Secondary | ICD-10-CM

## 2023-10-24 DIAGNOSIS — E1140 Type 2 diabetes mellitus with diabetic neuropathy, unspecified: Secondary | ICD-10-CM

## 2023-10-27 DIAGNOSIS — F1099 Alcohol use, unspecified with unspecified alcohol-induced disorder: Secondary | ICD-10-CM | POA: Diagnosis not present

## 2023-10-27 DIAGNOSIS — F329 Major depressive disorder, single episode, unspecified: Secondary | ICD-10-CM | POA: Diagnosis not present

## 2023-10-27 LAB — HEMOGLOBIN A1C: Hemoglobin A1C: 4.9

## 2023-11-06 NOTE — Telephone Encounter (Signed)
Reviewed claim with Billing. Patient will need to contact [employer] and dispute denial. [employer] did not cover visit due to [facility] not being listed as PCP. Patient will need to communicate with insurance and add practice as her provider.

## 2023-11-09 ENCOUNTER — Encounter: Payer: Self-pay | Admitting: Internal Medicine

## 2023-11-15 ENCOUNTER — Telehealth: Payer: Self-pay | Admitting: Internal Medicine

## 2023-11-15 NOTE — Telephone Encounter (Signed)
Copied from CRM [id number]. Topic: General - Billing Inquiry >> Nov 15, 2023 12:15 PM Herbert Seta B wrote: Reason for CRM: Patient calling for 2nd time about previous visit that was not coded correctly when billed to insurance

## 2023-11-16 NOTE — Telephone Encounter (Signed)
Reviewed claim with Billing. Patient will need to contact [employer] and dispute denial. [employer] did not cover visit due to [facility] not being listed as PCP. Patient will need to communicate with insurance and add practice as her provider.    Claim is coded correctly for AWV.

## 2023-11-17 ENCOUNTER — Telehealth: Payer: Self-pay | Admitting: Internal Medicine

## 2023-11-17 ENCOUNTER — Other Ambulatory Visit: Payer: Self-pay | Admitting: *Deleted

## 2023-11-17 ENCOUNTER — Encounter: Payer: Self-pay | Admitting: *Deleted

## 2023-11-17 ENCOUNTER — Other Ambulatory Visit: Payer: Self-pay | Admitting: Internal Medicine

## 2023-11-17 DIAGNOSIS — E785 Hyperlipidemia, unspecified: Secondary | ICD-10-CM

## 2023-11-17 DIAGNOSIS — Z5309 Procedure and treatment not carried out because of other contraindication: Secondary | ICD-10-CM

## 2023-11-17 DIAGNOSIS — K7030 Alcoholic cirrhosis of liver without ascites: Secondary | ICD-10-CM

## 2023-11-17 DIAGNOSIS — Z9189 Other specified personal risk factors, not elsewhere classified: Secondary | ICD-10-CM

## 2023-11-17 NOTE — Telephone Encounter (Signed)
Pt informed that [place] scheduled for 12/15/23, arrive at 8:15 am to check in, NPO after midnight. Verbalized understanding.

## 2023-11-17 NOTE — Telephone Encounter (Signed)
Patient called and left a message that she needs to schedule an ultrasound.  She said that she received a letter in the mail.

## 2023-11-20 ENCOUNTER — Telehealth: Payer: Self-pay

## 2023-11-20 NOTE — Telephone Encounter (Signed)
Pt says she spoke with insurance and they say she is needing to resolve this with [place]. Wishing to speak with someone regarding this matter. Please advise Thanks

## 2023-11-20 NOTE — Telephone Encounter (Signed)
Copied from CRM [id number]. Topic: General - Billing Inquiry >> Nov 20, 2023  3:53 PM Deaijah H wrote: Reason for CRM: Patient called in due to receiving a bill stating it was coded incorrectly and that she spoke with her insurance but they're sending her back to her primary. Advised patient of note from 11/21.

## 2023-11-21 NOTE — Telephone Encounter (Signed)
See previous tele encounters. Visit coded correctly. Patient needs to call insurance.

## 2023-11-22 ENCOUNTER — Telehealth: Payer: Self-pay

## 2023-11-22 ENCOUNTER — Other Ambulatory Visit: Payer: Self-pay | Admitting: Internal Medicine

## 2023-11-22 DIAGNOSIS — J432 Centrilobular emphysema: Secondary | ICD-10-CM

## 2023-11-22 NOTE — Telephone Encounter (Signed)
Copied from CRM [id number]. Topic: General - Billing Inquiry >> Nov 22, 2023  9:53 AM Fuller Mandril wrote: Reason for CRM: Pt called in regarding bill received that she states is about to be sent to collections. She states she has spoken with both the insurance ([employer]) and [employer]. [facility] billing told her to reach out to provider office for further assistance. Annette Stable is for appt 01/17/2023 with Dr. Barbaraann Faster. Pt was told that she did not need a referral, however, she received a bill and stated the insurance says its due to not having a referral. Pt was told she would receive a call back regarding this but has not yet spoken with anyone from the office. She wants to know if a referral can still be providing or if someone can reach out to insurance to see how to resolve this since it is not her error.

## 2023-11-22 NOTE — Telephone Encounter (Signed)
Copied from CRM [id number]. Topic: General - Billing Inquiry >> Nov 22, 2023  9:53 AM Fuller Mandril wrote: Reason for CRM: Pt called in regarding bill received that she states is about to be sent to collections. She states she has spoken with both the insurance ([employer]) and [employer]. [facility] billing told her to reach out to provider office for further assistance. Annette Stable is for appt 01/17/2023 with Dr. Barbaraann Faster. Pt was told that she did not need a referral, however, she received a bill and stated the insurance says its due to not having a referral. Pt was told she would receive a call back regarding this but has not yet spoken with anyone from the office. She wants to know if a referral can still be providing or if someone can reach out to insurance to see how to resolve this since it is not her error.

## 2023-11-22 NOTE — Telephone Encounter (Signed)
Ms. Michelle Michael claim was denied incorrectly as [facility] is her PCP and we are in network with her plan. They are sending this back for review/reprocessing. Ms. Michelle Michael is aware.

## 2023-11-22 NOTE — Telephone Encounter (Signed)
Duplicate message. 

## 2023-12-05 ENCOUNTER — Other Ambulatory Visit: Payer: Self-pay | Admitting: Internal Medicine

## 2023-12-05 DIAGNOSIS — F17200 Nicotine dependence, unspecified, uncomplicated: Secondary | ICD-10-CM

## 2023-12-07 DIAGNOSIS — F1099 Alcohol use, unspecified with unspecified alcohol-induced disorder: Secondary | ICD-10-CM | POA: Diagnosis not present

## 2023-12-07 DIAGNOSIS — F329 Major depressive disorder, single episode, unspecified: Secondary | ICD-10-CM | POA: Diagnosis not present

## 2023-12-13 ENCOUNTER — Telehealth: Payer: Self-pay

## 2023-12-13 ENCOUNTER — Other Ambulatory Visit: Payer: Self-pay | Admitting: Gastroenterology

## 2023-12-13 DIAGNOSIS — K2100 Gastro-esophageal reflux disease with esophagitis, without bleeding: Secondary | ICD-10-CM

## 2023-12-13 DIAGNOSIS — K7030 Alcoholic cirrhosis of liver without ascites: Secondary | ICD-10-CM

## 2023-12-13 MED ORDER — PANTOPRAZOLE SODIUM 40 MG PO TBEC: 40.0000 mg | DELAYED_RELEASE_TABLET | Freq: Two times a day (BID) | ORAL | 3 refills | Status: DC

## 2023-12-13 MED ORDER — FUROSEMIDE 40 MG PO TABS: 40.0000 mg | ORAL_TABLET | Freq: Two times a day (BID) | ORAL | 1 refills | Status: DC

## 2023-12-13 MED ORDER — SPIRONOLACTONE 100 MG PO TABS: 100.0000 mg | ORAL_TABLET | Freq: Every day | ORAL | 3 refills | Status: DC

## 2023-12-13 NOTE — Telephone Encounter (Signed)
Pt is requesting 90 day supplies of the following medication to be sent to [facility]: Lasix, pantoprazole, and spironolactone. Pt was last seen on 10/17/2023.

## 2023-12-13 NOTE — Telephone Encounter (Signed)
Done

## 2023-12-15 ENCOUNTER — Encounter: Payer: Self-pay | Admitting: Internal Medicine

## 2023-12-15 ENCOUNTER — Ambulatory Visit (HOSPITAL_COMMUNITY)
Admission: RE | Admit: 2023-12-15 | Discharge: 2023-12-15 | Disposition: A | Discharge status: Home / Self Care | Payer: Medicare HMO | Source: Ambulatory Visit | Attending: Gastroenterology | Admitting: Gastroenterology

## 2023-12-15 DIAGNOSIS — Z9049 Acquired absence of other specified parts of digestive tract: Secondary | ICD-10-CM | POA: Diagnosis not present

## 2023-12-15 DIAGNOSIS — K7460 Unspecified cirrhosis of liver: Secondary | ICD-10-CM | POA: Diagnosis not present

## 2023-12-15 DIAGNOSIS — K7689 Other specified diseases of liver: Secondary | ICD-10-CM | POA: Diagnosis not present

## 2023-12-15 DIAGNOSIS — K7030 Alcoholic cirrhosis of liver without ascites: Secondary | ICD-10-CM | POA: Diagnosis not present

## 2023-12-15 NOTE — Telephone Encounter (Signed)
 Care team updated and letter sent for eye exam notes.

## 2023-12-18 ENCOUNTER — Other Ambulatory Visit: Payer: Self-pay | Admitting: Internal Medicine

## 2023-12-18 DIAGNOSIS — Z5309 Procedure and treatment not carried out because of other contraindication: Secondary | ICD-10-CM

## 2023-12-18 DIAGNOSIS — E785 Hyperlipidemia, unspecified: Secondary | ICD-10-CM

## 2023-12-18 DIAGNOSIS — Z9189 Other specified personal risk factors, not elsewhere classified: Secondary | ICD-10-CM

## 2023-12-18 DIAGNOSIS — F17200 Nicotine dependence, unspecified, uncomplicated: Secondary | ICD-10-CM

## 2023-12-18 DIAGNOSIS — E876 Hypokalemia: Secondary | ICD-10-CM

## 2023-12-18 DIAGNOSIS — J432 Centrilobular emphysema: Secondary | ICD-10-CM

## 2023-12-18 NOTE — Telephone Encounter (Signed)
Copied from CRM [id number]. Topic: Clinical - Medication Refill >> Dec 18, 2023  4:54 PM Elle L wrote: Most Recent Primary Care Visit:  Provider: Christel Mormon E  Department: [facility]  Visit Type: OFFICE VISIT  Date: 09/29/2023  Medication:  VENTOLIN HFA 108 (90 Base) MCG/ACT inhaler STIOLTO RESPIMAT 2.5-2.5 MCG/ACT AERS REPATHA 140 MG/ML SOSY Potassium Chloride 40 MEQ/15ML (20%) SOLN  ondansetron (ZOFRAN) 4 MG tablet gabapentin (NEURONTIN) 600 MG tablet gabapentin (NEURONTIN) 300 MG capsule fluticasone (FLONASE) 50 MCG/ACT nasal spray   Has the patient contacted their pharmacy? Yes. The pharmacy advised the patient to reach out to her provider.  Is this the correct pharmacy for this prescription? Yes  [phone] [facility]  Has the prescription been filled recently? Yes  Is the patient out of the medication? Yes  Has the patient been seen for an appointment in the last year OR does the patient have an upcoming appointment? Yes  Can we respond through MyChart? Yes  Agent: Please be advised that Rx refills may take up to 3 business days. We ask that you follow-up with your pharmacy.

## 2023-12-19 ENCOUNTER — Other Ambulatory Visit: Payer: Self-pay

## 2023-12-19 ENCOUNTER — Telehealth: Payer: Self-pay

## 2023-12-19 ENCOUNTER — Other Ambulatory Visit: Payer: Self-pay | Admitting: Nurse Practitioner

## 2023-12-19 DIAGNOSIS — E119 Type 2 diabetes mellitus without complications: Secondary | ICD-10-CM

## 2023-12-19 DIAGNOSIS — E1140 Type 2 diabetes mellitus with diabetic neuropathy, unspecified: Secondary | ICD-10-CM

## 2023-12-19 DIAGNOSIS — K7030 Alcoholic cirrhosis of liver without ascites: Secondary | ICD-10-CM

## 2023-12-19 MED ORDER — ONDANSETRON HCL 4 MG PO TABS: 4.0000 mg | ORAL_TABLET | Freq: Three times a day (TID) | ORAL | 0 refills | Status: DC | PRN

## 2023-12-19 MED ORDER — GABAPENTIN 600 MG PO TABS: 600.0000 mg | ORAL_TABLET | Freq: Three times a day (TID) | ORAL | 0 refills | Status: DC

## 2023-12-19 MED ORDER — VENTOLIN HFA 108 (90 BASE) MCG/ACT IN AERS: INHALATION_SPRAY | RESPIRATORY_TRACT | 0 refills | Status: DC

## 2023-12-19 MED ORDER — GABAPENTIN 300 MG PO CAPS: 300.0000 mg | ORAL_CAPSULE | Freq: Once | ORAL | 0 refills | Status: DC

## 2023-12-19 MED ORDER — FLUTICASONE PROPIONATE 50 MCG/ACT NA SUSP: 2.0000 | Freq: Every day | NASAL | 0 refills | Status: DC

## 2023-12-19 MED ORDER — STIOLTO RESPIMAT 2.5-2.5 MCG/ACT IN AERS: 2.0000 | INHALATION_SPRAY | Freq: Once | RESPIRATORY_TRACT | 0 refills | Status: AC

## 2023-12-19 MED ORDER — REPATHA 140 MG/ML ~~LOC~~ SOSY: 140.0000 mg | PREFILLED_SYRINGE | SUBCUTANEOUS | 0 refills | Status: DC

## 2023-12-19 MED ORDER — POTASSIUM CHLORIDE 40 MEQ/15ML (20%) PO SOLN: 7.5000 mL | Freq: Every day | ORAL | 0 refills | Status: DC

## 2023-12-19 NOTE — Telephone Encounter (Signed)
Refills sent to pharmacy. 

## 2023-12-19 NOTE — Telephone Encounter (Signed)
Copied from CRM [id number]. Topic: Clinical - Medication Question >> Dec 18, 2023  5:03 PM Elle L wrote: Reason for CRM: The patient can no longer get the medication liraglutide (VICTOZA) 18 MG/3ML SOPN and is requesting a call back at [phone] to go over other options.

## 2023-12-19 NOTE — Telephone Encounter (Signed)
Copied from CRM [id number]. Topic: Clinical - Medication Refill >> Dec 18, 2023  4:54 PM Elle L wrote: Most Recent Primary Care Visit:  Provider: Christel Mormon E  Department: [facility]  Visit Type: OFFICE VISIT  Date: 09/29/2023  Medication:  VENTOLIN HFA 108 (90 Base) MCG/ACT inhaler STIOLTO RESPIMAT 2.5-2.5 MCG/ACT AERS REPATHA 140 MG/ML SOSY Potassium Chloride 40 MEQ/15ML (20%) SOLN  ondansetron (ZOFRAN) 4 MG tablet gabapentin (NEURONTIN) 600 MG tablet gabapentin (NEURONTIN) 300 MG capsule fluticasone (FLONASE) 50 MCG/ACT nasal spray   Has the patient contacted their pharmacy? Yes. The pharmacy advised the patient to reach out to her provider.  Is this the correct pharmacy for this prescription? Yes  [phone] [facility]  Has the prescription been filled recently? Yes  Is the patient out of the medication? Yes  Has the patient been seen for an appointment in the last year OR does the patient have an upcoming appointment? Yes  Can we respond through MyChart? Yes  Agent: Please be advised that Rx refills may take up to 3 business days. We ask that you follow-up with your pharmacy.

## 2023-12-21 ENCOUNTER — Other Ambulatory Visit: Payer: Self-pay

## 2023-12-21 DIAGNOSIS — K7030 Alcoholic cirrhosis of liver without ascites: Secondary | ICD-10-CM

## 2023-12-21 DIAGNOSIS — F17200 Nicotine dependence, unspecified, uncomplicated: Secondary | ICD-10-CM

## 2023-12-21 DIAGNOSIS — Z5309 Procedure and treatment not carried out because of other contraindication: Secondary | ICD-10-CM

## 2023-12-21 DIAGNOSIS — E876 Hypokalemia: Secondary | ICD-10-CM

## 2023-12-21 DIAGNOSIS — E1140 Type 2 diabetes mellitus with diabetic neuropathy, unspecified: Secondary | ICD-10-CM

## 2023-12-21 DIAGNOSIS — Z9189 Other specified personal risk factors, not elsewhere classified: Secondary | ICD-10-CM

## 2023-12-21 DIAGNOSIS — E785 Hyperlipidemia, unspecified: Secondary | ICD-10-CM

## 2023-12-21 MED ORDER — REPATHA 140 MG/ML ~~LOC~~ SOSY: 140.0000 mg | PREFILLED_SYRINGE | SUBCUTANEOUS | 0 refills | Status: DC

## 2023-12-21 MED ORDER — FLUTICASONE PROPIONATE 50 MCG/ACT NA SUSP: 2.0000 | Freq: Every day | NASAL | 0 refills | Status: DC

## 2023-12-21 MED ORDER — ONDANSETRON HCL 4 MG PO TABS: 4.0000 mg | ORAL_TABLET | Freq: Three times a day (TID) | ORAL | 0 refills | Status: DC | PRN

## 2023-12-21 MED ORDER — VENTOLIN HFA 108 (90 BASE) MCG/ACT IN AERS: INHALATION_SPRAY | RESPIRATORY_TRACT | 0 refills | Status: DC

## 2023-12-21 MED ORDER — POTASSIUM CHLORIDE 40 MEQ/15ML (20%) PO SOLN: 7.5000 mL | Freq: Every day | ORAL | 0 refills | Status: DC

## 2023-12-21 MED ORDER — GABAPENTIN 600 MG PO TABS: 600.0000 mg | ORAL_TABLET | Freq: Three times a day (TID) | ORAL | 0 refills | Status: DC

## 2023-12-21 MED ORDER — GABAPENTIN 300 MG PO CAPS: 300.0000 mg | ORAL_CAPSULE | Freq: Once | ORAL | 0 refills | Status: AC

## 2023-12-21 NOTE — Telephone Encounter (Signed)
Appt scheduled pt aware  

## 2023-12-22 ENCOUNTER — Telehealth (INDEPENDENT_AMBULATORY_CARE_PROVIDER_SITE_OTHER): Payer: Medicare HMO | Admitting: Internal Medicine

## 2023-12-22 ENCOUNTER — Encounter: Payer: Self-pay | Admitting: Internal Medicine

## 2023-12-22 DIAGNOSIS — E119 Type 2 diabetes mellitus without complications: Secondary | ICD-10-CM | POA: Diagnosis not present

## 2023-12-22 DIAGNOSIS — Z794 Long term (current) use of insulin: Secondary | ICD-10-CM | POA: Diagnosis not present

## 2023-12-22 MED ORDER — TIRZEPATIDE 2.5 MG/0.5ML ~~LOC~~ SOAJ: 2.5000 mg | SUBCUTANEOUS | 0 refills | Status: DC

## 2023-12-22 NOTE — Assessment & Plan Note (Signed)
Evaluated today for acute visit through video encounter to discuss diabetes management.  She is currently prescribed Victoza 1.8 mg daily, metformin XR 500 mg daily, and Novolin 70/30 15 units daily.  She has one remaining vial of Victoza and reports that none of her preferred pharmacies have Victoza in stock. -Through shared decision making, Victoza has been discontinued in favor of Mounjaro 2.5 mg weekly.  We will plan to increase Mounjaro to 5 mg weekly after she completes 4 injections of 2.5 mg.  She will continue Novolin 70/30 and metformin XR for now.  If blood sugar readings are low, she was instructed to discontinue Novolin. -She will return to care for previously scheduled follow-up in early April 2025.

## 2023-12-22 NOTE — Progress Notes (Signed)
Virtual Visit via Video Note  I connected with Michelle Michael on 12/22/23 at 11:40 AM EST by a video enabled telemedicine application and verified that I am speaking with the correct person using two identifiers.  Patient Location: Home Provider Location: Office/Clinic  I discussed the limitations, risks, security, and privacy concerns of performing an evaluation and management service by video and the availability of in person appointments. I also discussed with the patient that there may be a patient responsible charge related to this service. The patient expressed understanding and agreed to proceed.  Subjective: PCP: Billie Lade, MD  Chief Complaint  Patient presents with   Diabetes    Follow up, patient is not able to get victoza    Michelle Michael has been evaluated today for an acute visit through video encounter to discuss diabetes mellitus.  She recently contacted our office to report that she was unable to refill Victoza.  She is currently prescribed Victoza 1.8 mg daily but her local pharmacy as well as mail order pharmacy does not have Victoza in stock.  She has also contacted other local pharmacies and they do not have Victoza either.  A1c 4.9 in early November.  She was instructed by endocrinology to stop Novolin 70/30 at that time.  She states that her blood sugar significantly increased and she had multiple readings > 300.  She was instructed to resume Novolin 70/30 at 15 units twice daily, which she states significantly lowered her blood sugar.  She had multiple hypoglycemic readings and reduce the frequency of Novolin 70/30 to 15 units once daily.  Her blood sugar this morning was 120.  She uses a freestyle libre and reports readings that are largely within goal.  She is additionally prescribed metformin XR 500 mg daily.  ROS: Per HPI  Current Outpatient Medications:    tirzepatide (MOUNJARO) 2.5 MG/0.5ML Pen, Inject 2.5 mg into the skin once a week., Disp: 2 mL, Rfl: 0    busPIRone (BUSPAR) 10 MG tablet, Take 10 mg by mouth 3 (three) times daily., Disp: , Rfl:    Continuous Glucose Receiver (FREESTYLE LIBRE 3 READER) DEVI, 1 each by Other route See admin instructions., Disp: 1 each, Rfl: 0   Continuous Glucose Sensor (FREESTYLE LIBRE 3 PLUS SENSOR) MISC, Change sensor every 15 days., Disp: 6 each, Rfl: 3   DULoxetine (CYMBALTA) 30 MG capsule, Take 30 mg by mouth every morning., Disp: , Rfl:    Evolocumab (REPATHA) 140 MG/ML SOSY, Inject 140 mg into the skin every 14 (fourteen) days., Disp: 2 mL, Rfl: 0   fluticasone (FLONASE) 50 MCG/ACT nasal spray, Place 2 sprays into both nostrils daily., Disp: 16 g, Rfl: 0   folic acid (FOLVITE) 400 MCG tablet, Take 400 mcg by mouth daily., Disp: , Rfl:    furosemide (LASIX) 40 MG tablet, Take 1 tablet (40 mg total) by mouth 2 (two) times daily., Disp: 180 tablet, Rfl: 1   gabapentin (NEURONTIN) 300 MG capsule, Take 1 capsule (300 mg total) by mouth once for 1 dose., Disp: 90 capsule, Rfl: 0   gabapentin (NEURONTIN) 600 MG tablet, Take 1 tablet (600 mg total) by mouth 3 (three) times daily., Disp: 180 tablet, Rfl: 0   Glucagon, rDNA, (GLUCAGON EMERGENCY) 1 MG KIT, Inject 1 mg into the muscle as needed (low blood sugar)., Disp: , Rfl:    insulin isophane & regular human KwikPen (NOVOLIN 70/30 KWIKPEN) (70-30) 100 UNIT/ML KwikPen, Inject 15 Units into the skin in the morning and  at bedtime., Disp: 25 mL, Rfl: 3   Insulin Pen Needle (BD PEN NEEDLE NANO 2ND GEN) 32G X 4 MM MISC, Use to inject insulin and Victoza for 3 injections per day, Disp: 1000 each, Rfl: 6   INSULIN SYRINGE .5CC/29G 29G X 1/2" 0.5 ML MISC, Use to inject insulin twice daily., Disp: 100 each, Rfl: 3   metFORMIN (GLUCOPHAGE-XR) 500 MG 24 hr tablet, Take 1 tablet (500 mg total) by mouth daily with breakfast., Disp: 90 tablet, Rfl: 3   ondansetron (ZOFRAN) 4 MG tablet, Take 1 tablet (4 mg total) by mouth every 8 (eight) hours as needed., Disp: 20 tablet, Rfl: 0    pantoprazole (PROTONIX) 40 MG tablet, Take 1 tablet (40 mg total) by mouth 2 (two) times daily., Disp: 180 tablet, Rfl: 3   Potassium Chloride 40 MEQ/15ML (20%) SOLN, Take 7.5 mLs by mouth daily., Disp: 473 mL, Rfl: 0   spironolactone (ALDACTONE) 100 MG tablet, Take 1 tablet (100 mg total) by mouth daily., Disp: 90 tablet, Rfl: 3   traZODone (DESYREL) 50 MG tablet, Take 1 tablet (50 mg total) by mouth at bedtime as needed for sleep., Disp: 30 tablet, Rfl: 0   VENTOLIN HFA 108 (90 Base) MCG/ACT inhaler, INHALE 2 PUFFS BY MOUTH EVERY 6 HOURS AS NEEDED FOR WHEEZING OR SHORTNESS OF BREATH, Disp: 18 g, Rfl: 0  Assessment and Plan:  Type 2 diabetes mellitus without complication, with long-term current use of insulin (HCC) Assessment & Plan: Evaluated today for acute visit through video encounter to discuss diabetes management.  She is currently prescribed Victoza 1.8 mg daily, metformin XR 500 mg daily, and Novolin 70/30 15 units daily.  She has one remaining vial of Victoza and reports that none of her preferred pharmacies have Victoza in stock. -Through shared decision making, Victoza has been discontinued in favor of Mounjaro 2.5 mg weekly.  We will plan to increase Mounjaro to 5 mg weekly after she completes 4 injections of 2.5 mg.  She will continue Novolin 70/30 and metformin XR for now.  If blood sugar readings are low, she was instructed to discontinue Novolin. -She will return to care for previously scheduled follow-up in early April 2025.  Follow Up Instructions: Return in about 14 weeks (around 03/29/2024).   I discussed the assessment and treatment plan with the patient. The patient was provided an opportunity to ask questions, and all were answered. The patient agreed with the plan and demonstrated an understanding of the instructions.   The patient was advised to call back or seek an in-person evaluation if the symptoms worsen or if the condition fails to improve as anticipated.  The above  assessment and management plan was discussed with the patient. The patient verbalized understanding of and has agreed to the management plan.   Billie Lade, MD

## 2024-01-01 ENCOUNTER — Telehealth: Payer: Self-pay

## 2024-01-01 ENCOUNTER — Ambulatory Visit: Payer: Self-pay

## 2024-01-01 NOTE — Telephone Encounter (Signed)
 Copied from CRM [id number]. Topic: Clinical - Prescription Issue >> Dec 29, 2023  3:05 PM Carlatta H wrote: Reason for CRM: Cindy @ [facility] [phone] reference # [id number]//Please call back regarding prescription

## 2024-01-01 NOTE — Telephone Encounter (Signed)
 Spoke to pharmacist

## 2024-01-10 ENCOUNTER — Other Ambulatory Visit: Payer: Self-pay | Admitting: Internal Medicine

## 2024-01-10 DIAGNOSIS — E119 Type 2 diabetes mellitus without complications: Secondary | ICD-10-CM

## 2024-01-16 ENCOUNTER — Encounter: Payer: Self-pay | Admitting: Internal Medicine

## 2024-01-16 ENCOUNTER — Ambulatory Visit (INDEPENDENT_AMBULATORY_CARE_PROVIDER_SITE_OTHER): Payer: Medicare HMO

## 2024-01-16 VITALS — BP 138/71 | HR 101 | Temp 98.2°F | Resp 22 | Ht 65.0 in | Wt 163.0 lb

## 2024-01-16 DIAGNOSIS — Z1231 Encounter for screening mammogram for malignant neoplasm of breast: Secondary | ICD-10-CM

## 2024-01-16 DIAGNOSIS — Z87891 Personal history of nicotine dependence: Secondary | ICD-10-CM | POA: Diagnosis not present

## 2024-01-16 DIAGNOSIS — Z0000 Encounter for general adult medical examination without abnormal findings: Secondary | ICD-10-CM

## 2024-01-16 DIAGNOSIS — H9193 Unspecified hearing loss, bilateral: Secondary | ICD-10-CM | POA: Diagnosis not present

## 2024-01-16 NOTE — Progress Notes (Signed)
Because this visit was a virtual/telehealth visit,  certain criteria was not obtained, such a blood pressure, CBG if applicable, and timed get up and go. Any medications not marked as "taking" were not mentioned during the medication reconciliation part of the visit. Any vitals not documented were not able to be obtained due to this being a telehealth visit or patient was unable to self-report a recent blood pressure reading due to a lack of equipment at home via telehealth. Vitals that have been documented are verbally provided by the patient.  Interactive audio and video telecommunications were attempted between this provider and patient, however failed, due to patient having technical difficulties OR patient did not have access to video capability.  We continued and completed visit with audio only.  Subjective:   Michelle Michael is a 63 y.o. female who presents for Medicare Annual (Subsequent) preventive examination.  Visit Complete: Virtual I connected with  Michelle Michael on 01/16/24 by a video and audio enabled telemedicine application and verified that I am speaking with the correct person using two identifiers.  Patient Location: Home  Provider Location: Home Office  I discussed the limitations of evaluation and management by telemedicine. The patient expressed understanding and agreed to proceed.  Vital Signs: Because this visit was a virtual/telehealth visit, some criteria may be missing or patient reported. Any vitals not documented were not able to be obtained and vitals that have been documented are patient reported.  Patient Medicare AWV questionnaire was completed by the patient on 01/15/2024; I have confirmed that all information answered by patient is correct and no changes since this date.  Cardiac Risk Factors include: diabetes mellitus;dyslipidemia;hypertension;sedentary lifestyle;smoking/ tobacco exposure     Objective:    Today's Vitals   01/16/24 0934  BP:  138/71  Pulse: (!) 101  Resp: (!) 22  Temp: 98.2 F (36.8 C)  Weight: 163 lb (73.9 kg)  Height: 5\' 5"  (1.651 m)   Body mass index is 27.12 kg/m.     01/16/2024    9:25 AM 02/08/2023    9:28 AM 10/07/2022    1:06 PM 06/21/2015   12:00 PM 06/17/2015    2:28 AM  Advanced Directives  Does Patient Have a Medical Advance Directive? No No No  No  Would patient like information on creating a medical advance directive? No - Patient declined No - Patient declined No - Patient declined  No - patient declined information     Information is confidential and restricted. Go to Review Flowsheets to unlock data.    Current Medications (verified) Outpatient Encounter Medications as of 01/16/2024  Medication Sig   busPIRone (BUSPAR) 10 MG tablet Take 10 mg by mouth 3 (three) times daily.   Continuous Glucose Receiver (FREESTYLE LIBRE 3 READER) DEVI 1 each by Other route See admin instructions.   Continuous Glucose Sensor (FREESTYLE LIBRE 3 PLUS SENSOR) MISC Change sensor every 15 days.   DULoxetine (CYMBALTA) 60 MG capsule Take 60 mg by mouth daily.   Evolocumab (REPATHA) 140 MG/ML SOSY Inject 140 mg into the skin every 14 (fourteen) days.   fluticasone (FLONASE) 50 MCG/ACT nasal spray Place 2 sprays into both nostrils daily.   folic acid (FOLVITE) 400 MCG tablet Take 400 mcg by mouth daily.   furosemide (LASIX) 40 MG tablet Take 1 tablet (40 mg total) by mouth 2 (two) times daily.   gabapentin (NEURONTIN) 300 MG capsule Take 1 capsule (300 mg total) by mouth once for 1 dose.   gabapentin (  NEURONTIN) 600 MG tablet Take 1 tablet (600 mg total) by mouth 3 (three) times daily.   Glucagon, rDNA, (GLUCAGON EMERGENCY) 1 MG KIT Inject 1 mg into the muscle as needed (low blood sugar).   insulin isophane & regular human KwikPen (NOVOLIN 70/30 KWIKPEN) (70-30) 100 UNIT/ML KwikPen Inject 15 Units into the skin in the morning and at bedtime.   Insulin Pen Needle (BD PEN NEEDLE NANO 2ND GEN) 32G X 4 MM MISC Use  to inject insulin and Victoza for 3 injections per day   INSULIN SYRINGE .5CC/29G 29G X 1/2" 0.5 ML MISC Use to inject insulin twice daily.   metFORMIN (GLUCOPHAGE-XR) 500 MG 24 hr tablet Take 1 tablet (500 mg total) by mouth daily with breakfast.   ondansetron (ZOFRAN) 4 MG tablet Take 1 tablet (4 mg total) by mouth every 8 (eight) hours as needed.   pantoprazole (PROTONIX) 40 MG tablet Take 1 tablet (40 mg total) by mouth 2 (two) times daily.   Potassium Chloride 40 MEQ/15ML (20%) SOLN Take 7.5 mLs by mouth daily.   spironolactone (ALDACTONE) 100 MG tablet Take 1 tablet (100 mg total) by mouth daily.   STIOLTO RESPIMAT 2.5-2.5 MCG/ACT AERS Inhale 1 puff into the lungs in the morning and at bedtime.   tirzepatide (MOUNJARO) 2.5 MG/0.5ML Pen INJECT 2.5MG  (1 PEN) UNDER THE SKIN EVERY WEEK   traZODone (DESYREL) 100 MG tablet Take 100 mg by mouth at bedtime.   VENTOLIN HFA 108 (90 Base) MCG/ACT inhaler INHALE 2 PUFFS BY MOUTH EVERY 6 HOURS AS NEEDED FOR WHEEZING OR SHORTNESS OF BREATH   [DISCONTINUED] DULoxetine (CYMBALTA) 30 MG capsule Take 30 mg by mouth every morning.   [DISCONTINUED] traZODone (DESYREL) 50 MG tablet Take 1 tablet (50 mg total) by mouth at bedtime as needed for sleep.   No facility-administered encounter medications on file as of 01/16/2024.    Allergies (verified) Patient has no known allergies.   History: Past Medical History:  Diagnosis Date   Anemia    Anxiety    Centrilobular emphysema (HCC) 03/22/2023   Cirrhosis (HCC)    CKD (chronic kidney disease)    COPD (chronic obstructive pulmonary disease) (HCC)    Depression    Diabetes mellitus, type II (HCC)    GERD (gastroesophageal reflux disease)    Hyperlipidemia    Hypertension    Hypokalemia    Hyponatremia    Neuromuscular disorder (HCC) 2021   Substance abuse (HCC)    Years ago   Ulcer 1980s   Past Surgical History:  Procedure Laterality Date   BIOPSY  02/10/2023   Procedure: BIOPSY;  Surgeon:  Lanelle Bal, DO;  Location: [facility];  Service: Endoscopy;;   CESAREAN SECTION  1984   CHOLECYSTECTOMY     ESOPHAGEAL BRUSHING  02/10/2023   Procedure: ESOPHAGEAL BRUSHING;  Surgeon: Lanelle Bal, DO;  Location: [facility];  Service: Endoscopy;;   ESOPHAGOGASTRODUODENOSCOPY (EGD) WITH PROPOFOL N/A 02/10/2023   Procedure: ESOPHAGOGASTRODUODENOSCOPY (EGD) WITH PROPOFOL;  Surgeon: Lanelle Bal, DO;  Location: [facility];  Service: Endoscopy;  Laterality: N/A;  8:45 am   FRACTURE SURGERY  2005   ORTHOPEDIC SURGERY     SPINE SURGERY  2003   TUBAL LIGATION  1993   Family History  Problem Relation Age of Onset   Diabetes Mother    Hypertension Mother    Alcohol abuse Mother    Cancer Mother    Hypertension Father    Arthritis Father  Heart disease Father    Cancer Daughter    Obesity Daughter    Drug abuse Son    Breast cancer Neg Hx    Social History   Socioeconomic History   Marital status: Divorced    Spouse name: Not on file   Number of children: 2   Years of education: 12   Highest education level: Associate degree: occupational, Scientist, product/process development, or vocational program  Occupational History   Not on file  Tobacco Use   Smoking status: Every Day    Current packs/day: 1.00    Average packs/day: 1 pack/day for 40.0 years (40.0 ttl pk-yrs)    Types: Cigarettes    Passive exposure: Current   Smokeless tobacco: Never   Tobacco comments:    Smoking Cessation Classes Offered.  Vaping Use   Vaping status: Former  Substance and Sexual Activity   Alcohol use: Yes    Alcohol/week: 10.0 standard drinks of alcohol    Types: 10 Glasses of wine per week    Comment: occassional wine- 2-3 times weekly   Drug use: Never   Sexual activity: Not Currently    Partners: Male    Birth control/protection: Abstinence, Post-menopausal, None  Other Topics Concern   Not on file  Social History Narrative   Lives with her room mate    Social Drivers of Health    Financial Resource Strain: Low Risk  (01/12/2024)   Overall Financial Resource Strain (CARDIA)    Difficulty of Paying Living Expenses: Not very hard  Food Insecurity: No Food Insecurity (01/12/2024)   Hunger Vital Sign    Worried About Running Out of Food in the Last Year: Never true    Ran Out of Food in the Last Year: Never true  Transportation Needs: Unmet Transportation Needs (01/12/2024)   PRAPARE - Transportation    Lack of Transportation (Medical): Yes    Lack of Transportation (Non-Medical): Yes  Physical Activity: Insufficiently Active (01/12/2024)   Exercise Vital Sign    Days of Exercise per Week: 1 day    Minutes of Exercise per Session: 10 min  Stress: Stress Concern Present (01/12/2024)   [employer] of Occupational Health - Occupational Stress Questionnaire    Feeling of Stress : Rather much  Social Connections: Socially Isolated (01/12/2024)   Social Connection and Isolation Panel [NHANES]    Frequency of Communication with Friends and Family: Twice a week    Frequency of Social Gatherings with Friends and Family: Never    Attends Religious Services: Never    Database administrator or Organizations: No    Attends Engineer, structural: 1 to 4 times per year    Marital Status: Divorced    Tobacco Counseling Ready to quit: Yes Counseling given: Yes Tobacco comments: Smoking Cessation Classes Offered.   Clinical Intake:  Pre-visit preparation completed: Yes  Pain : No/denies pain     BMI - recorded: 27.12 Nutritional Status: BMI 25 -29 Overweight Nutritional Risks: None Diabetes: No  How often do you need to have someone help you when you read instructions, pamphlets, or other written materials from your doctor or pharmacy?: 1 - Never  Interpreter Needed?: No  Information entered by :: Maryjean Ka CMA   Activities of Daily Living    01/12/2024    5:15 PM 02/08/2023    9:31 AM  In your present state of health, do you have any difficulty  performing the following activities:  Hearing? 1   Vision? 1   Difficulty  concentrating or making decisions? 0   Walking or climbing stairs? 1   Dressing or bathing? 0   Doing errands, shopping? 1 0  Preparing Food and eating ? N   Using the Toilet? N   In the past six months, have you accidently leaked urine? Y   Managing your Medications? N   Managing your Finances? N   Housekeeping or managing your Housekeeping? Y     Patient Care Team: Billie Lade, MD as PCP - General (Internal Medicine) Dani Gobble, NP as Referring Physician (Nurse Practitioner) [facility], [facility]  Indicate any recent Medical Services you may have received from other than [facility] providers in the past year (date may be approximate).     Assessment:   This is a routine wellness examination for [place].  Hearing/Vision screen Hearing Screening - Comments:: Patient has severe difficulties hearing. Has had a hearing test around 6 mths ago. Unhappy with provider. Referral placed today to establish with a new provider. Patient in agreement with treatment plan.   Vision Screening - Comments:: Wears rx glasses - up to date with routine eye exams  Patient sees Dr. Daisy Lazar w/ [facility] [place] office.     Goals Addressed             This Visit's Progress    Patient Stated       To be able to exercise more because my legs are getting weak. PT referral placed for strengthing and conditioning        Depression Screen    01/16/2024    9:54 AM 12/22/2023   11:13 AM 09/29/2023    3:27 PM 05/02/2023    9:08 AM 03/30/2023   11:12 AM 03/16/2023   10:09 AM 01/17/2023   10:24 AM  PHQ 2/9 Scores  PHQ - 2 Score 0 0 0 0 2 3 2   PHQ- 9 Score 0   0 11 11     Fall Risk    01/12/2024    5:15 PM 12/22/2023   11:13 AM 09/29/2023    3:27 PM 05/02/2023    9:08 AM 03/30/2023   11:12 AM  Fall Risk   Falls in the past year? 1 0 1 1 1   Number falls in past yr: 1 0 0 1 1  Injury with  Fall? 1 0 0 0 0  Risk for fall due to : History of fall(s);Impaired mobility No Fall Risks  Impaired balance/gait;Impaired mobility Impaired balance/gait;Impaired mobility  Follow up  Falls evaluation completed  Falls evaluation completed Falls evaluation completed    MEDICARE RISK AT HOME: Medicare Risk at Home Any stairs in or around the home?: (Patient-Rptd) No If so, are there any without handrails?: (Patient-Rptd) No Home free of loose throw rugs in walkways, pet beds, electrical cords, etc?: (Patient-Rptd) No Adequate lighting in your home to reduce risk of falls?: (Patient-Rptd) Yes Life alert?: (Patient-Rptd) No Use of a cane, walker or w/c?: (Patient-Rptd) Yes Grab bars in the bathroom?: (Patient-Rptd) No Shower chair or bench in shower?: (Patient-Rptd) Yes Elevated toilet seat or a handicapped toilet?: (Patient-Rptd) No  TIMED UP AND GO:  Was the test performed?  No    Cognitive Function:        01/16/2024    9:50 AM 01/17/2023   10:28 AM  6CIT Screen  What Year? 0 points   What month? 0 points 0 points  What time? 0 points 0 points  Count back from 20  0 points 0 points  Months in reverse 0 points 0 points  Repeat phrase 0 points 0 points  Total Score 0 points     Immunizations Immunization History  Administered Date(s) Administered   Hepatitis A, Adult 11/13/2020   Tdap 11/13/2020    TDAP status: Up to date  Flu Vaccine status: Up to date  Pneumococcal vaccine status: Due, Education has been provided regarding the importance of this vaccine. Advised may receive this vaccine at local pharmacy or Health Dept. Aware to provide a copy of the vaccination record if obtained from local pharmacy or Health Dept. Verbalized acceptance and understanding.  Covid-19 vaccine status: Information provided on how to obtain vaccines.   Qualifies for Shingles Vaccine? Yes   Zostavax completed No   Shingrix Completed?: No.    Education has been provided regarding the  importance of this vaccine. Patient has been advised to call insurance company to determine out of pocket expense if they have not yet received this vaccine. Advised may also receive vaccine at local pharmacy or Health Dept. Verbalized acceptance and understanding.  Screening Tests Health Maintenance  Topic Date Due   Pneumococcal Vaccine 36-32 Years old (1 of 2 - PCV) Never done   OPHTHALMOLOGY EXAM  Never done   Cervical Cancer Screening (HPV/Pap Cotest)  Never done   Zoster Vaccines- Shingrix (1 of 2) Never done   MAMMOGRAM  06/25/2023   FOOT EXAM  07/16/2023   Diabetic kidney evaluation - Urine ACR  12/22/2023   Medicare Annual Wellness (AWV)  01/18/2024   INFLUENZA VACCINE  03/25/2024 (Originally 07/27/2023)   Lung Cancer Screening  03/12/2024   HEMOGLOBIN A1C  04/25/2024   Diabetic kidney evaluation - eGFR measurement  09/28/2024   Colonoscopy  04/06/2030   DTaP/Tdap/Td (2 - Td or Tdap) 11/13/2030   Hepatitis C Screening  Completed   HPV VACCINES  Aged Out   COVID-19 Vaccine  Discontinued   HIV Screening  Discontinued    Health Maintenance  Health Maintenance Due  Topic Date Due   Pneumococcal Vaccine 65-51 Years old (1 of 2 - PCV) Never done   OPHTHALMOLOGY EXAM  Never done   Cervical Cancer Screening (HPV/Pap Cotest)  Never done   Zoster Vaccines- Shingrix (1 of 2) Never done   MAMMOGRAM  06/25/2023   FOOT EXAM  07/16/2023   Diabetic kidney evaluation - Urine ACR  12/22/2023   Medicare Annual Wellness (AWV)  01/18/2024    Colorectal cancer screening: Type of screening: Colonoscopy. Completed 04/06/2020. Repeat every 10 years  Mammogram status: Ordered 01/16/2024. Pt provided with contact info and advised to call to schedule appt.   Bone Density Screening: Not age appropriate for this patient.   Lung Cancer Screening: (Low Dose CT Chest recommended if Age 57-80 years, 20 pack-year currently smoking OR have quit w/in 15years.) does qualify.   Lung Cancer Screening  Referral: 01/16/2024  Additional Screening:  Hepatitis C Screening: does not qualify; Completed   Vision Screening: Recommended annual ophthalmology exams for early detection of glaucoma and other disorders of the eye. Is the patient up to date with their annual eye exam?  Yes  Who is the provider or what is the name of the office in which the patient attends annual eye exams? [facility]. Patient sees Dr. Daisy Lazar w/ [facility] [place] office.  If pt is not established with a provider, would they like to be referred to a provider to establish care? No .   Dental  Screening: Recommended annual dental exams for proper oral hygiene  Diabetic Foot Exam: Diabetic Foot Exam: Overdue, Pt has been advised about the importance in completing this exam. Pt is scheduled for diabetic foot exam on provider notified of need for diabetic foot exam to close HM gap.  Community Resource Referral / Chronic Care Management: CRR required this visit?  No   CCM required this visit?  No     Plan:     I have personally reviewed and noted the following in the patient's chart:   Medical and social history Use of alcohol, tobacco or illicit drugs  Current medications and supplements including opioid prescriptions. Patient is not currently taking opioid prescriptions. Functional ability and status Nutritional status Physical activity Advanced directives List of other physicians Hospitalizations, surgeries, and ER visits in previous 12 months Vitals Screenings to include cognitive, depression, and falls Referrals and appointments  In addition, I have reviewed and discussed with patient certain preventive protocols, quality metrics, and best practice recommendations. A written personalized care plan for preventive services as well as general preventive health recommendations were provided to patient.     Jordan Hawks Cloud Graham, CMA   01/16/2024   After Visit Summary: (MyChart) Due to this being a  telephonic visit, the after visit summary with patients personalized plan was offered to patient via MyChart   Nurse Notes: see routing comment

## 2024-01-16 NOTE — Patient Instructions (Signed)
Michelle Michael , Thank you for taking time to come for your Medicare Wellness Visit. I appreciate your ongoing commitment to your health goals. Please review the following plan we discussed and let me know if I can assist you in the future.   Referrals/Orders/Follow-Ups/Clinician Recommendations:  Next Medicare Annual Wellness Visit:  January 21, 2025 at 8:00 am video visit  You have been referred to an audiologist to have a hearing test.If you haven't heard from them in the next week, please call their office to schedule your appointment.   [facility] Address: [address], [place], [place] [address] Phone: [phone]  You have an order for:  []   2D Mammogram  [x]   3D Mammogram  []   Bone Density   [x]   Lung Cancer Screening  Please call for appointment:   [facility] [address] [place], [place] [address] [phone]  Make sure to wear two-piece clothing.  No lotions powders or deodorants the day of the appointment Make sure to bring picture ID and insurance card.  Bring list of medications you are currently taking including any supplements.   Schedule your [place] screening mammogram through MyChart!   Log into your MyChart account.  Go to 'Visit' (or 'Appointments' if on mobile App) --> Schedule an Appointment  Under 'Select a Reason for Visit' choose the Mammogram Screening option.  Complete the pre-visit questions and select the time and place that best fits your schedule.    This is a list of the screening recommended for you and due dates:  Health Maintenance  Topic Date Due   Pneumococcal Vaccination (1 of 2 - PCV) Never done   Eye exam for diabetics  Never done   Pap with HPV screening  Never done   Zoster (Shingles) Vaccine (1 of 2) Never done   Mammogram  06/25/2023   Complete foot exam   07/16/2023   Yearly kidney health urinalysis for diabetes  12/22/2023   Flu Shot  03/25/2024*   Screening for Lung Cancer   03/12/2024   Hemoglobin A1C  04/25/2024   Yearly kidney function blood test for diabetes  09/28/2024   Medicare Annual Wellness Visit  01/15/2025   Colon Cancer Screening  04/06/2030   DTaP/Tdap/Td vaccine (2 - Td or Tdap) 11/13/2030   Hepatitis C Screening  Completed   HPV Vaccine  Aged Out   COVID-19 Vaccine  Discontinued   HIV Screening  Discontinued  *Topic was postponed. The date shown is not the original due date.    Advanced directives: (Declined) Advance directive discussed with you today. Even though you declined this today, please call our office should you change your mind, and we can give you the proper paperwork for you to fill out.  Next Medicare Annual Wellness Visit scheduled for next year: yes  Preventive Care 65-67 Years Old, Female Preventive care refers to lifestyle choices and visits with your health care provider that can promote health and wellness. Preventive care visits are also called wellness exams. What can I expect for my preventive care visit? Counseling Your health care provider may ask you questions about your: Medical history, including: Past medical problems. Family medical history. Pregnancy history. Current health, including: Menstrual cycle. Method of birth control. Emotional well-being. Home life and relationship well-being. Sexual activity and sexual health. Lifestyle, including: Alcohol, nicotine or tobacco, and drug use. Access to firearms. Diet, exercise, and sleep habits. Work and work Astronomer. Sunscreen use. Safety issues  such as seatbelt and bike helmet use. Physical exam Your health care provider will check your: Height and weight. These may be used to calculate your BMI (body mass index). BMI is a measurement that tells if you are at a healthy weight. Waist circumference. This measures the distance around your waistline. This measurement also tells if you are at a healthy weight and may help predict your risk of certain  diseases, such as type 2 diabetes and high blood pressure. Heart rate and blood pressure. Body temperature. Skin for abnormal spots. What immunizations do I need?  Vaccines are usually given at various ages, according to a schedule. Your health care provider will recommend vaccines for you based on your age, medical history, and lifestyle or other factors, such as travel or where you work. What tests do I need? Screening Your health care provider may recommend screening tests for certain conditions. This may include: Lipid and cholesterol levels. Diabetes screening. This is done by checking your blood sugar (glucose) after you have not eaten for a while (fasting). Pelvic exam and Pap test. Hepatitis B test. Hepatitis C test. HIV (human immunodeficiency virus) test. STI (sexually transmitted infection) testing, if you are at risk. Lung cancer screening. Colorectal cancer screening. Mammogram. Talk with your health care provider about when you should start having regular mammograms. This may depend on whether you have a family history of breast cancer. BRCA-related cancer screening. This may be done if you have a family history of breast, ovarian, tubal, or peritoneal cancers. Bone density scan. This is done to screen for osteoporosis. Talk with your health care provider about your test results, treatment options, and if necessary, the need for more tests. Follow these instructions at home: Eating and drinking  Eat a diet that includes fresh fruits and vegetables, whole grains, lean protein, and low-fat dairy products. Take vitamin and mineral supplements as recommended by your health care provider. Do not drink alcohol if: Your health care provider tells you not to drink. You are pregnant, may be pregnant, or are planning to become pregnant. If you drink alcohol: Limit how much you have to 0-1 drink a day. Know how much alcohol is in your drink. In the [place] one drink equals one 12 oz  bottle of beer (355 mL), one 5 oz glass of wine (148 mL), or one 1 oz glass of hard liquor (44 mL). Lifestyle Brush your teeth every morning and night with fluoride toothpaste. Floss one time each day. Exercise for at least 30 minutes 5 or more days each week. Do not use any products that contain nicotine or tobacco. These products include cigarettes, chewing tobacco, and vaping devices, such as e-cigarettes. If you need help quitting, ask your health care provider. Do not use drugs. If you are sexually active, practice safe sex. Use a condom or other form of protection to prevent STIs. If you do not wish to become pregnant, use a form of birth control. If you plan to become pregnant, see your health care provider for a prepregnancy visit. Take aspirin only as told by your health care provider. Make sure that you understand how much to take and what form to take. Work with your health care provider to find out whether it is safe and beneficial for you to take aspirin daily. Find healthy ways to manage stress, such as: Meditation, yoga, or listening to music. Journaling. Talking to a trusted person. Spending time with friends and family. Minimize exposure to UV radiation to reduce  your risk of skin cancer. Safety Always wear your seat belt while driving or riding in a vehicle. Do not drive: If you have been drinking alcohol. Do not ride with someone who has been drinking. When you are tired or distracted. While texting. If you have been using any mind-altering substances or drugs. Wear a helmet and other protective equipment during sports activities. If you have firearms in your house, make sure you follow all gun safety procedures. Seek help if you have been physically or sexually abused. What's next? Visit your health care provider once a year for an annual wellness visit. Ask your health care provider how often you should have your eyes and teeth checked. Stay up to date on all  vaccines. This information is not intended to replace advice given to you by your health care provider. Make sure you discuss any questions you have with your health care provider. Document Revised: 06/09/2021 Document Reviewed: 06/09/2021 Elsevier Patient Education  2024 [employer].  Understanding Your Risk for Falls Millions of people have serious injuries from falls each year. It is important to understand your risk of falling. Talk with your health care provider about your risk and what you can do to lower it. If you do have a serious fall, make sure to tell your provider. Falling once raises your risk of falling again. How can falls affect me? Serious injuries from falls are common. These include: Broken bones, such as hip fractures. Head injuries, such as traumatic brain injuries (TBI) or concussions. A fear of falling can cause you to avoid activities and stay at home. This can make your muscles weaker and raise your risk for a fall. What can increase my risk? There are a number of risk factors that increase your risk for falling. The more risk factors you have, the higher your risk of falling. Serious injuries from a fall happen most often to people who are older than 63 years old. Teenagers and young adults ages 42-29 are also at higher risk. Common risk factors include: Weakness in the lower body. Being generally weak or confused due to long-term (chronic) illness. Dizziness or balance problems. Poor vision. Medicines that cause dizziness or drowsiness. These may include: Medicines for your blood pressure, heart, anxiety, insomnia, or swelling (edema). Pain medicines. Muscle relaxants. Other risk factors include: Drinking alcohol. Having had a fall in the past. Having foot pain or wearing improper footwear. Working at a dangerous job. Having any of the following in your home: Tripping hazards, such as floor clutter or loose rugs. Poor lighting. Pets. Having dementia or  memory loss. What actions can I take to lower my risk of falling?     Physical activity Stay physically fit. Do strength and balance exercises. Consider taking a regular class to build strength and balance. Yoga and tai chi are good options. Vision Have your eyes checked every year and your prescription for glasses or contacts updated as needed. Shoes and walking aids Wear non-skid shoes. Wear shoes that have rubber soles and low heels. Do not wear high heels. Do not walk around the house in socks or slippers. Use a cane or walker as told by your provider. Home safety Attach secure railings on both sides of your stairs. Install grab bars for your bathtub, shower, and toilet. Use a non-skid mat in your bathtub or shower. Attach bath mats securely with double-sided, non-slip rug tape. Use good lighting in all rooms. Keep a flashlight near your bed. Make sure there is  a clear path from your bed to the bathroom. Use night-lights. Do not use throw rugs. Make sure all carpeting is taped or tacked down securely. Remove all clutter from walkways and stairways, including extension cords. Repair uneven or broken steps and floors. Avoid walking on icy or slippery surfaces. Walk on the grass instead of on icy or slick sidewalks. Use ice melter to get rid of ice on walkways in the winter. Use a cordless phone. Questions to ask your health care provider Can you help me check my risk for a fall? Do any of my medicines make me more likely to fall? Should I take a vitamin D supplement? What exercises can I do to improve my strength and balance? Should I make an appointment to have my vision checked? Do I need a bone density test to check for weak bones (osteoporosis)? Would it help to use a cane or a walker? Where to find more information Centers for Disease Control and Prevention, [employer]: TonerPromos.no Community-Based Fall Prevention Programs: TonerPromos.no [employer] on Aging: BaseRingTones.pl Contact a  health care provider if: You fall at home. You are afraid of falling at home. You feel weak, drowsy, or dizzy. This information is not intended to replace advice given to you by your health care provider. Make sure you discuss any questions you have with your health care provider. Document Revised: 08/15/2022 Document Reviewed: 08/15/2022 Elsevier Patient Education  2024 [employer].

## 2024-01-17 ENCOUNTER — Other Ambulatory Visit: Payer: Self-pay | Admitting: Internal Medicine

## 2024-01-17 DIAGNOSIS — Z9189 Other specified personal risk factors, not elsewhere classified: Secondary | ICD-10-CM

## 2024-01-17 DIAGNOSIS — E785 Hyperlipidemia, unspecified: Secondary | ICD-10-CM

## 2024-01-17 DIAGNOSIS — Z5309 Procedure and treatment not carried out because of other contraindication: Secondary | ICD-10-CM

## 2024-01-18 ENCOUNTER — Other Ambulatory Visit: Payer: Self-pay | Admitting: Internal Medicine

## 2024-01-19 DIAGNOSIS — F1099 Alcohol use, unspecified with unspecified alcohol-induced disorder: Secondary | ICD-10-CM | POA: Diagnosis not present

## 2024-01-19 DIAGNOSIS — F329 Major depressive disorder, single episode, unspecified: Secondary | ICD-10-CM | POA: Diagnosis not present

## 2024-01-20 ENCOUNTER — Other Ambulatory Visit: Payer: Self-pay | Admitting: Internal Medicine

## 2024-01-24 ENCOUNTER — Other Ambulatory Visit: Payer: Self-pay | Admitting: Internal Medicine

## 2024-01-24 DIAGNOSIS — Z5309 Procedure and treatment not carried out because of other contraindication: Secondary | ICD-10-CM

## 2024-01-24 DIAGNOSIS — Z9189 Other specified personal risk factors, not elsewhere classified: Secondary | ICD-10-CM

## 2024-01-24 DIAGNOSIS — E785 Hyperlipidemia, unspecified: Secondary | ICD-10-CM

## 2024-01-25 ENCOUNTER — Encounter: Payer: Self-pay | Admitting: Internal Medicine

## 2024-01-25 ENCOUNTER — Inpatient Hospital Stay (HOSPITAL_COMMUNITY): Payer: Medicare HMO

## 2024-01-25 ENCOUNTER — Emergency Department (HOSPITAL_COMMUNITY): Payer: Medicare HMO

## 2024-01-25 ENCOUNTER — Encounter (HOSPITAL_COMMUNITY): Payer: Self-pay

## 2024-01-25 ENCOUNTER — Other Ambulatory Visit: Payer: Self-pay

## 2024-01-25 ENCOUNTER — Inpatient Hospital Stay (HOSPITAL_COMMUNITY)
Admission: EM | Admit: 2024-01-25 | Discharge: 2024-02-24 | DRG: 432 | Disposition: E | Payer: Medicare HMO | Attending: Internal Medicine | Admitting: Internal Medicine

## 2024-01-25 ENCOUNTER — Ambulatory Visit (INDEPENDENT_AMBULATORY_CARE_PROVIDER_SITE_OTHER): Payer: Medicare HMO | Admitting: Internal Medicine

## 2024-01-25 VITALS — BP 98/65 | HR 126 | Ht 65.0 in | Wt 165.6 lb

## 2024-01-25 DIAGNOSIS — I10 Essential (primary) hypertension: Secondary | ICD-10-CM | POA: Diagnosis present

## 2024-01-25 DIAGNOSIS — K7031 Alcoholic cirrhosis of liver with ascites: Principal | ICD-10-CM | POA: Diagnosis present

## 2024-01-25 DIAGNOSIS — R531 Weakness: Secondary | ICD-10-CM | POA: Diagnosis present

## 2024-01-25 DIAGNOSIS — Z66 Do not resuscitate: Secondary | ICD-10-CM | POA: Diagnosis not present

## 2024-01-25 DIAGNOSIS — E119 Type 2 diabetes mellitus without complications: Secondary | ICD-10-CM | POA: Diagnosis not present

## 2024-01-25 DIAGNOSIS — Z7141 Alcohol abuse counseling and surveillance of alcoholic: Secondary | ICD-10-CM

## 2024-01-25 DIAGNOSIS — J9601 Acute respiratory failure with hypoxia: Secondary | ICD-10-CM | POA: Diagnosis not present

## 2024-01-25 DIAGNOSIS — K222 Esophageal obstruction: Secondary | ICD-10-CM | POA: Diagnosis not present

## 2024-01-25 DIAGNOSIS — R278 Other lack of coordination: Secondary | ICD-10-CM | POA: Diagnosis present

## 2024-01-25 DIAGNOSIS — D62 Acute posthemorrhagic anemia: Secondary | ICD-10-CM | POA: Diagnosis present

## 2024-01-25 DIAGNOSIS — R578 Other shock: Secondary | ICD-10-CM | POA: Diagnosis not present

## 2024-01-25 DIAGNOSIS — Z7985 Long-term (current) use of injectable non-insulin antidiabetic drugs: Secondary | ICD-10-CM

## 2024-01-25 DIAGNOSIS — J9602 Acute respiratory failure with hypercapnia: Secondary | ICD-10-CM | POA: Diagnosis not present

## 2024-01-25 DIAGNOSIS — I8510 Secondary esophageal varices without bleeding: Secondary | ICD-10-CM | POA: Diagnosis present

## 2024-01-25 DIAGNOSIS — T39395A Adverse effect of other nonsteroidal anti-inflammatory drugs [NSAID], initial encounter: Secondary | ICD-10-CM | POA: Diagnosis present

## 2024-01-25 DIAGNOSIS — E876 Hypokalemia: Secondary | ICD-10-CM | POA: Diagnosis not present

## 2024-01-25 DIAGNOSIS — K3189 Other diseases of stomach and duodenum: Secondary | ICD-10-CM | POA: Diagnosis present

## 2024-01-25 DIAGNOSIS — K219 Gastro-esophageal reflux disease without esophagitis: Secondary | ICD-10-CM | POA: Insufficient documentation

## 2024-01-25 DIAGNOSIS — Z5982 Transportation insecurity: Secondary | ICD-10-CM

## 2024-01-25 DIAGNOSIS — A419 Sepsis, unspecified organism: Secondary | ICD-10-CM | POA: Diagnosis not present

## 2024-01-25 DIAGNOSIS — R935 Abnormal findings on diagnostic imaging of other abdominal regions, including retroperitoneum: Secondary | ICD-10-CM | POA: Diagnosis not present

## 2024-01-25 DIAGNOSIS — F17210 Nicotine dependence, cigarettes, uncomplicated: Secondary | ICD-10-CM | POA: Diagnosis present

## 2024-01-25 DIAGNOSIS — E8809 Other disorders of plasma-protein metabolism, not elsewhere classified: Secondary | ICD-10-CM | POA: Diagnosis present

## 2024-01-25 DIAGNOSIS — D689 Coagulation defect, unspecified: Secondary | ICD-10-CM | POA: Diagnosis present

## 2024-01-25 DIAGNOSIS — F32A Depression, unspecified: Secondary | ICD-10-CM | POA: Diagnosis present

## 2024-01-25 DIAGNOSIS — D539 Nutritional anemia, unspecified: Secondary | ICD-10-CM | POA: Diagnosis not present

## 2024-01-25 DIAGNOSIS — K7011 Alcoholic hepatitis with ascites: Secondary | ICD-10-CM | POA: Diagnosis present

## 2024-01-25 DIAGNOSIS — F419 Anxiety disorder, unspecified: Secondary | ICD-10-CM | POA: Diagnosis present

## 2024-01-25 DIAGNOSIS — J189 Pneumonia, unspecified organism: Secondary | ICD-10-CM | POA: Diagnosis not present

## 2024-01-25 DIAGNOSIS — K766 Portal hypertension: Secondary | ICD-10-CM | POA: Diagnosis not present

## 2024-01-25 DIAGNOSIS — E1142 Type 2 diabetes mellitus with diabetic polyneuropathy: Secondary | ICD-10-CM | POA: Diagnosis not present

## 2024-01-25 DIAGNOSIS — D72829 Elevated white blood cell count, unspecified: Secondary | ICD-10-CM

## 2024-01-25 DIAGNOSIS — J440 Chronic obstructive pulmonary disease with acute lower respiratory infection: Secondary | ICD-10-CM | POA: Diagnosis present

## 2024-01-25 DIAGNOSIS — K7030 Alcoholic cirrhosis of liver without ascites: Principal | ICD-10-CM

## 2024-01-25 DIAGNOSIS — I2510 Atherosclerotic heart disease of native coronary artery without angina pectoris: Secondary | ICD-10-CM | POA: Diagnosis present

## 2024-01-25 DIAGNOSIS — J449 Chronic obstructive pulmonary disease, unspecified: Secondary | ICD-10-CM | POA: Insufficient documentation

## 2024-01-25 DIAGNOSIS — G9340 Encephalopathy, unspecified: Secondary | ICD-10-CM

## 2024-01-25 DIAGNOSIS — G312 Degeneration of nervous system due to alcohol: Secondary | ICD-10-CM | POA: Diagnosis not present

## 2024-01-25 DIAGNOSIS — K259 Gastric ulcer, unspecified as acute or chronic, without hemorrhage or perforation: Secondary | ICD-10-CM | POA: Diagnosis not present

## 2024-01-25 DIAGNOSIS — R918 Other nonspecific abnormal finding of lung field: Secondary | ICD-10-CM | POA: Diagnosis not present

## 2024-01-25 DIAGNOSIS — E785 Hyperlipidemia, unspecified: Secondary | ICD-10-CM | POA: Diagnosis present

## 2024-01-25 DIAGNOSIS — R188 Other ascites: Secondary | ICD-10-CM | POA: Diagnosis not present

## 2024-01-25 DIAGNOSIS — E871 Hypo-osmolality and hyponatremia: Secondary | ICD-10-CM | POA: Diagnosis present

## 2024-01-25 DIAGNOSIS — K7460 Unspecified cirrhosis of liver: Secondary | ICD-10-CM | POA: Diagnosis not present

## 2024-01-25 DIAGNOSIS — F10939 Alcohol use, unspecified with withdrawal, unspecified: Secondary | ICD-10-CM | POA: Diagnosis not present

## 2024-01-25 DIAGNOSIS — R579 Shock, unspecified: Secondary | ICD-10-CM | POA: Diagnosis not present

## 2024-01-25 DIAGNOSIS — Z515 Encounter for palliative care: Secondary | ICD-10-CM

## 2024-01-25 DIAGNOSIS — R0902 Hypoxemia: Secondary | ICD-10-CM | POA: Diagnosis not present

## 2024-01-25 DIAGNOSIS — E663 Overweight: Secondary | ICD-10-CM | POA: Diagnosis present

## 2024-01-25 DIAGNOSIS — F1090 Alcohol use, unspecified, uncomplicated: Secondary | ICD-10-CM | POA: Diagnosis not present

## 2024-01-25 DIAGNOSIS — J441 Chronic obstructive pulmonary disease with (acute) exacerbation: Secondary | ICD-10-CM | POA: Diagnosis present

## 2024-01-25 DIAGNOSIS — K254 Chronic or unspecified gastric ulcer with hemorrhage: Secondary | ICD-10-CM | POA: Diagnosis not present

## 2024-01-25 DIAGNOSIS — K449 Diaphragmatic hernia without obstruction or gangrene: Secondary | ICD-10-CM | POA: Diagnosis present

## 2024-01-25 DIAGNOSIS — Z79899 Other long term (current) drug therapy: Secondary | ICD-10-CM

## 2024-01-25 DIAGNOSIS — N179 Acute kidney failure, unspecified: Secondary | ICD-10-CM | POA: Diagnosis present

## 2024-01-25 DIAGNOSIS — F10139 Alcohol abuse with withdrawal, unspecified: Secondary | ICD-10-CM | POA: Diagnosis present

## 2024-01-25 DIAGNOSIS — Z7189 Other specified counseling: Secondary | ICD-10-CM

## 2024-01-25 DIAGNOSIS — K7682 Hepatic encephalopathy: Secondary | ICD-10-CM | POA: Diagnosis present

## 2024-01-25 DIAGNOSIS — D649 Anemia, unspecified: Secondary | ICD-10-CM | POA: Diagnosis not present

## 2024-01-25 DIAGNOSIS — Z833 Family history of diabetes mellitus: Secondary | ICD-10-CM

## 2024-01-25 DIAGNOSIS — N281 Cyst of kidney, acquired: Secondary | ICD-10-CM | POA: Diagnosis not present

## 2024-01-25 DIAGNOSIS — Z7984 Long term (current) use of oral hypoglycemic drugs: Secondary | ICD-10-CM

## 2024-01-25 DIAGNOSIS — K713 Toxic liver disease with chronic persistent hepatitis: Secondary | ICD-10-CM | POA: Diagnosis not present

## 2024-01-25 DIAGNOSIS — D500 Iron deficiency anemia secondary to blood loss (chronic): Secondary | ICD-10-CM | POA: Diagnosis not present

## 2024-01-25 DIAGNOSIS — Z604 Social exclusion and rejection: Secondary | ICD-10-CM | POA: Diagnosis present

## 2024-01-25 DIAGNOSIS — R933 Abnormal findings on diagnostic imaging of other parts of digestive tract: Secondary | ICD-10-CM | POA: Diagnosis not present

## 2024-01-25 DIAGNOSIS — Z6827 Body mass index (BMI) 27.0-27.9, adult: Secondary | ICD-10-CM

## 2024-01-25 DIAGNOSIS — Z8249 Family history of ischemic heart disease and other diseases of the circulatory system: Secondary | ICD-10-CM

## 2024-01-25 DIAGNOSIS — E538 Deficiency of other specified B group vitamins: Secondary | ICD-10-CM | POA: Diagnosis present

## 2024-01-25 DIAGNOSIS — R109 Unspecified abdominal pain: Secondary | ICD-10-CM | POA: Diagnosis not present

## 2024-01-25 DIAGNOSIS — Z811 Family history of alcohol abuse and dependence: Secondary | ICD-10-CM

## 2024-01-25 DIAGNOSIS — R6521 Severe sepsis with septic shock: Secondary | ICD-10-CM | POA: Diagnosis not present

## 2024-01-25 DIAGNOSIS — Z794 Long term (current) use of insulin: Secondary | ICD-10-CM

## 2024-01-25 DIAGNOSIS — Z860100 Personal history of colon polyps, unspecified: Secondary | ICD-10-CM

## 2024-01-25 DIAGNOSIS — K7290 Hepatic failure, unspecified without coma: Secondary | ICD-10-CM | POA: Diagnosis not present

## 2024-01-25 DIAGNOSIS — R0600 Dyspnea, unspecified: Secondary | ICD-10-CM | POA: Diagnosis not present

## 2024-01-25 DIAGNOSIS — R17 Unspecified jaundice: Secondary | ICD-10-CM

## 2024-01-25 DIAGNOSIS — D509 Iron deficiency anemia, unspecified: Secondary | ICD-10-CM | POA: Diagnosis not present

## 2024-01-25 DIAGNOSIS — R000 Tachycardia, unspecified: Secondary | ICD-10-CM | POA: Diagnosis not present

## 2024-01-25 LAB — HEPATIC FUNCTION PANEL
ALT: 20 U/L (ref 0–44)
AST: 63 U/L — ABNORMAL HIGH (ref 15–41)
Albumin: 2.3 g/dL — ABNORMAL LOW (ref 3.5–5.0)
Alkaline Phosphatase: 157 U/L — ABNORMAL HIGH (ref 38–126)
Bilirubin, Direct: 2.9 mg/dL — ABNORMAL HIGH (ref 0.0–0.2)
Indirect Bilirubin: 4.9 mg/dL — ABNORMAL HIGH (ref 0.3–0.9)
Total Bilirubin: 7.8 mg/dL — ABNORMAL HIGH (ref 0.0–1.2)
Total Protein: 6.6 g/dL (ref 6.5–8.1)

## 2024-01-25 LAB — BASIC METABOLIC PANEL
Anion gap: 16 — ABNORMAL HIGH (ref 5–15)
BUN: 8 mg/dL (ref 8–23)
CO2: 24 mmol/L (ref 22–32)
Calcium: 8.1 mg/dL — ABNORMAL LOW (ref 8.9–10.3)
Chloride: 90 mmol/L — ABNORMAL LOW (ref 98–111)
Creatinine, Ser: 1.03 mg/dL — ABNORMAL HIGH (ref 0.44–1.00)
GFR, Estimated: >60 mL/min (ref >60)
Glucose, Bld: 146 mg/dL — ABNORMAL HIGH (ref 70–99)
Potassium: 3.6 mmol/L (ref 3.5–5.1)
Sodium: 130 mmol/L — ABNORMAL LOW (ref 135–145)

## 2024-01-25 LAB — CBC WITH DIFFERENTIAL/PLATELET
Abs Immature Granulocytes: 0.13 10*3/uL — ABNORMAL HIGH (ref 0.00–0.07)
Basophils Absolute: 0.1 10*3/uL (ref 0.0–0.1)
Basophils Relative: 0 %
Eosinophils Absolute: 0.1 10*3/uL (ref 0.0–0.5)
Eosinophils Relative: 0 %
HCT: 27.7 % — ABNORMAL LOW (ref 36.0–46.0)
Hemoglobin: 9.2 g/dL — ABNORMAL LOW (ref 12.0–15.0)
Immature Granulocytes: 1 %
Lymphocytes Relative: 12 %
Lymphs Abs: 2 10*3/uL (ref 0.7–4.0)
MCH: 36.8 pg — ABNORMAL HIGH (ref 26.0–34.0)
MCHC: 33.2 g/dL (ref 30.0–36.0)
MCV: 110.8 fL — ABNORMAL HIGH (ref 80.0–100.0)
Monocytes Absolute: 1.2 10*3/uL — ABNORMAL HIGH (ref 0.1–1.0)
Monocytes Relative: 7 %
Neutro Abs: 13.3 10*3/uL — ABNORMAL HIGH (ref 1.7–7.7)
Neutrophils Relative %: 80 %
Platelets: 134 10*3/uL — ABNORMAL LOW (ref 150–400)
RBC: 2.5 MIL/uL — ABNORMAL LOW (ref 3.87–5.11)
RDW: 14.5 % (ref 11.5–15.5)
WBC: 16.8 10*3/uL — ABNORMAL HIGH (ref 4.0–10.5)
nRBC: 0 % (ref 0.0–0.2)

## 2024-01-25 LAB — PROTIME-INR
INR: 1.6 — ABNORMAL HIGH (ref 0.8–1.2)
Prothrombin Time: 18.8 s — ABNORMAL HIGH (ref 11.4–15.2)

## 2024-01-25 LAB — AMMONIA: Ammonia: 31 umol/L (ref 9–35)

## 2024-01-25 LAB — LIPASE, BLOOD: Lipase: 34 U/L (ref 11–51)

## 2024-01-25 MED ORDER — SODIUM CHLORIDE 0.9 % IV BOLUS
1000.0000 mL | Freq: Once | INTRAVENOUS | Status: AC
Start: 2024-01-25 — End: 2024-01-25
  Administered 2024-01-25: 1000 mL via INTRAVENOUS

## 2024-01-25 MED ORDER — IOHEXOL 300 MG/ML  SOLN
100.0000 mL | Freq: Once | INTRAMUSCULAR | Status: AC | PRN
  Administered 2024-01-25: 100 mL via INTRAVENOUS

## 2024-01-25 MED ORDER — PANTOPRAZOLE SODIUM 40 MG PO TBEC
40.0000 mg | DELAYED_RELEASE_TABLET | Freq: Two times a day (BID) | ORAL | Status: DC
Start: 2024-01-25 — End: 2024-01-26

## 2024-01-25 MED ORDER — PIPERACILLIN-TAZOBACTAM 3.375 G IVPB
3.3750 g | Freq: Three times a day (TID) | INTRAVENOUS | Status: DC
  Administered 2024-01-26: 3.375 g via INTRAVENOUS
  Filled 2024-01-25: qty 50

## 2024-01-25 MED ORDER — PIPERACILLIN-TAZOBACTAM 3.375 G IVPB 30 MIN
3.3750 g | Freq: Once | INTRAVENOUS | Status: AC
  Administered 2024-01-25: 3.375 g via INTRAVENOUS
  Filled 2024-01-25: qty 50

## 2024-01-25 NOTE — ED Provider Notes (Signed)
EMERGENCY DEPARTMENT AT [facility] Provider Note   CSN: [id number] Arrival date & time: 01/25/24  1445     History  Chief Complaint  Patient presents with   Weakness    Michelle Michael is a 63 y.o. female.  Patient has a history of alcohol abuse.  Patient comes in here jaundiced and weak and having chills.  The history is provided by the patient and medical records. No language interpreter was used.  Weakness Severity:  Moderate Onset quality:  Sudden Progression:  Waxing and waning Chronicity:  Recurrent Relieved by:  Nothing Worsened by:  Nothing Ineffective treatments:  None tried Associated symptoms: no abdominal pain, no chest pain, no cough, no diarrhea, no frequency, no headaches and no seizures        Home Medications Prior to Admission medications   Medication Sig Start Date End Date Taking? Authorizing Provider  busPIRone (BUSPAR) 10 MG tablet Take 10 mg by mouth 3 (three) times daily. 05/31/21   [provider]  Continuous Glucose Receiver (FREESTYLE LIBRE 3 READER) DEVI 1 each by Other route See admin instructions. 12/21/23   Dani Gobble, NP  Continuous Glucose Sensor (FREESTYLE LIBRE 3 PLUS SENSOR) MISC Change sensor every 15 days. 09/28/23   Dani Gobble, NP  DULoxetine (CYMBALTA) 60 MG capsule Take 60 mg by mouth daily. 12/08/23   [provider]  fluticasone Aleda Grana) 50 MCG/ACT nasal spray USE 2 SPRAYS IN EACH NOSTRIL EVERY DAY 01/22/24   Billie Lade, MD  folic acid (FOLVITE) 400 MCG tablet Take 400 mcg by mouth daily.    [provider]  furosemide (LASIX) 40 MG tablet Take 1 tablet (40 mg total) by mouth 2 (two) times daily. 12/13/23 03/12/24  Aida Raider, NP  gabapentin (NEURONTIN) 300 MG capsule Take 1 capsule (300 mg total) by mouth once for 1 dose. 12/21/23 01/16/24  Billie Lade, MD  gabapentin (NEURONTIN) 600 MG tablet Take 1 tablet (600 mg total) by mouth 3 (three) times  daily. 12/21/23   Billie Lade, MD  Glucagon, rDNA, (GLUCAGON EMERGENCY) 1 MG KIT Inject 1 mg into the muscle as needed (low blood sugar). 08/02/21   [provider]  insulin isophane & regular human KwikPen (NOVOLIN 70/30 KWIKPEN) (70-30) 100 UNIT/ML KwikPen Inject 15 Units into the skin in the morning and at bedtime. 09/28/23   Dani Gobble, NP  Insulin Pen Needle (BD PEN NEEDLE NANO 2ND GEN) 32G X 4 MM MISC Use to inject insulin and Victoza for 3 injections per day 09/13/23   Dani Gobble, NP  INSULIN SYRINGE .5CC/29G 29G X 1/2" 0.5 ML MISC Use to inject insulin twice daily. 09/13/23   Dani Gobble, NP  metFORMIN (GLUCOPHAGE-XR) 500 MG 24 hr tablet Take 1 tablet (500 mg total) by mouth daily with breakfast. 09/28/23   Dani Gobble, NP  ondansetron (ZOFRAN) 4 MG tablet Take 1 tablet (4 mg total) by mouth every 8 (eight) hours as needed. 12/21/23   Billie Lade, MD  pantoprazole (PROTONIX) 40 MG tablet Take 1 tablet (40 mg total) by mouth 2 (two) times daily. 12/13/23 12/12/24  Aida Raider, NP  Potassium Chloride 40 MEQ/15ML (20%) SOLN Take 7.5 mLs by mouth daily. 12/21/23   Billie Lade, MD  REPATHA 140 MG/ML SOSY INJECT 140MG  UNDER THE SKIN EVERY 14 DAYS 01/17/24   Billie Lade, MD  spironolactone (ALDACTONE) 100 MG tablet Take 1 tablet (100 mg  total) by mouth daily. 12/13/23   Aida Raider, NP  STIOLTO RESPIMAT 2.5-2.5 MCG/ACT AERS INHALE 2 PUFFS BY MOUTH ONCE DAILY 01/18/24   Billie Lade, MD  tirzepatide [facility]) 2.5 MG/0.5ML Pen INJECT 2.5MG  (1 PEN) UNDER THE SKIN EVERY WEEK 01/10/24   Billie Lade, MD  traZODone (DESYREL) 100 MG tablet Take 100 mg by mouth at bedtime. 12/08/23   [provider]  VENTOLIN HFA 108 (90 Base) MCG/ACT inhaler INHALE 2 PUFFS BY MOUTH EVERY 6 HOURS AS NEEDED FOR WHEEZING OR SHORTNESS OF BREATH 12/21/23   Billie Lade, MD      Allergies    Patient has no known allergies.    Review of  Systems   Review of Systems  Constitutional:  Negative for appetite change and fatigue.  HENT:  Negative for congestion, ear discharge and sinus pressure.   Eyes:  Negative for discharge.  Respiratory:  Negative for cough.   Cardiovascular:  Negative for chest pain.  Gastrointestinal:  Negative for abdominal pain and diarrhea.  Genitourinary:  Negative for frequency and hematuria.  Musculoskeletal:  Negative for back pain.  Skin:  Negative for rash.  Neurological:  Positive for weakness. Negative for seizures and headaches.  Psychiatric/Behavioral:  Negative for hallucinations.     Physical Exam Updated Vital Signs BP (!) 118/53   Pulse (!) 111   Temp 98.7 F (37.1 C) (Oral)   Resp 18   Ht 5\' 5"  (1.651 m)   Wt 75.2 kg   SpO2 96%   BMI 27.58 kg/m  Physical Exam Vitals and nursing note reviewed.  Constitutional:      Appearance: She is well-developed.  HENT:     Head: Normocephalic.     Nose: Nose normal.  Eyes:     General: Scleral icterus present.     Conjunctiva/sclera: Conjunctivae normal.  Neck:     Thyroid: No thyromegaly.  Cardiovascular:     Rate and Rhythm: Normal rate and regular rhythm.     Heart sounds: No murmur heard.    No friction rub. No gallop.  Pulmonary:     Breath sounds: No stridor. No wheezing or rales.  Chest:     Chest wall: No tenderness.  Abdominal:     General: There is no distension.     Tenderness: There is no abdominal tenderness. There is no rebound.  Musculoskeletal:        General: Normal range of motion.     Cervical back: Neck supple.  Lymphadenopathy:     Cervical: No cervical adenopathy.  Skin:    Findings: No erythema or rash.  Neurological:     Mental Status: She is alert and oriented to person, place, and time.     Motor: No abnormal muscle tone.     Coordination: Coordination normal.  Psychiatric:        Behavior: Behavior normal.     ED Results / Procedures / Treatments   Labs (all labs ordered are listed,  but only abnormal results are displayed) Labs Reviewed  CBC WITH DIFFERENTIAL/PLATELET - Abnormal; Notable for the following components:      Result Value   WBC 16.8 (*)    RBC 2.50 (*)    Hemoglobin 9.2 (*)    HCT 27.7 (*)    MCV 110.8 (*)    MCH 36.8 (*)    Platelets 134 (*)    Neutro Abs 13.3 (*)    Monocytes Absolute 1.2 (*)    Abs Immature  Granulocytes 0.13 (*)    All other components within normal limits  HEPATIC FUNCTION PANEL - Abnormal; Notable for the following components:   Albumin 2.3 (*)    AST 63 (*)    Alkaline Phosphatase 157 (*)    Total Bilirubin 7.8 (*)    Bilirubin, Direct 2.9 (*)    Indirect Bilirubin 4.9 (*)    All other components within normal limits  BASIC METABOLIC PANEL - Abnormal; Notable for the following components:   Sodium 130 (*)    Chloride 90 (*)    Glucose, Bld 146 (*)    Creatinine, Ser 1.03 (*)    Calcium 8.1 (*)    Anion gap 16 (*)    All other components within normal limits  PROTIME-INR - Abnormal; Notable for the following components:   Prothrombin Time 18.8 (*)    INR 1.6 (*)    All other components within normal limits  AMMONIA  LIPASE, BLOOD  URINALYSIS, ROUTINE W REFLEX MICROSCOPIC  HEPATITIS PANEL, ACUTE    EKG None  Radiology CT ABDOMEN PELVIS W CONTRAST Result Date: 01/25/2024 CLINICAL DATA:  Abdominal pain, acute, nonlocalized EXAM: CT ABDOMEN AND PELVIS WITH CONTRAST TECHNIQUE: Multidetector CT imaging of the abdomen and pelvis was performed using the standard protocol following bolus administration of intravenous contrast. RADIATION DOSE REDUCTION: This exam was performed according to the departmental dose-optimization program which includes automated exposure control, adjustment of the mA and/or kV according to patient size and/or use of iterative reconstruction technique. CONTRAST:  OMNIPAQUE IOHEXOL 300 MG/ML  SOLN COMPARISON:  Ultrasound abdomen 12/15/2023 FINDINGS: Lower chest: No acute abnormality.  Hepatobiliary: Nodular hepatic contour. No focal liver abnormality. Status post cholecystectomy. No biliary dilatation. Pancreas: No focal lesion. Normal pancreatic contour. No surrounding inflammatory changes. No main pancreatic ductal dilatation. Spleen: Normal in size without focal abnormality. Adrenals/Urinary Tract: No adrenal nodule bilaterally. Bilateral kidneys enhance symmetrically. Fluid dense lesion likely represents a simple renal cyst. Simple renal cysts, in the absence of clinically indicated signs/symptoms, require no independent follow-up. No hydronephrosis. No hydroureter. The urinary bladder is unremarkable. Stomach/Bowel: Question thinning and slight hazy contour of the greater curvature (2:26, 4:22). No evidence of bowel wall thickening or dilatation. Appendix appears normal. Vascular/Lymphatic: The portal, splenic, superior mesenteric veins are patent. No abdominal aorta or iliac aneurysm. Severe atherosclerotic plaque of the aorta and its branches. No abdominal, pelvic, or inguinal lymphadenopathy. Reproductive: Uterus and bilateral adnexa are unremarkable. Other: Moderate volume simple free fluid ascites. No intraperitoneal free gas. No organized fluid collection. Musculoskeletal: Diffuse subsegmental atelectasis. No suspicious lytic or blastic osseous lesions. No acute displaced fracture. IMPRESSION: 1. Question thinning and slight hazy contour of the greater curvature. Underlying ulceration is not excluded. No bowel perforation. Consider correlation with direct visualization. 2. Cirrhosis. No focal liver lesions identified. Please note that liver protocol enhanced MR and CT are the most sensitive tests for the screening detection of hepatocellular carcinoma in the high risk setting of cirrhosis. 3. Moderate volume simple free fluid ascites. 4.  Aortic Atherosclerosis (ICD10-I70.0). Electronically Signed   By: Tish Frederickson M.D.   On: 01/25/2024 22:24    Procedures Procedures     Medications Ordered in ED Medications  piperacillin-tazobactam (ZOSYN) IVPB 3.375 g (has no administration in time range)  pantoprazole (PROTONIX) EC tablet 40 mg (has no administration in time range)  sodium chloride 0.9 % bolus 1,000 mL (1,000 mLs Intravenous New Bag/Given 01/25/24 2106)  piperacillin-tazobactam (ZOSYN) IVPB 3.375 g (0 g Intravenous Stopped 01/25/24  2150)  iohexol (OMNIPAQUE) 300 MG/ML solution 100 mL (100 mLs Intravenous Contrast Given 01/25/24 2126)    ED Course/ Medical Decision Making/ A&P     This patient presents to the ED for concern of jaundice and weakness, this involves an extensive number of treatment options, and is a complaint that carries with it a high risk of complications and morbidity.  The differential diagnosis includes alcohol disease: Jaundice   Co morbidities that complicate the patient evaluation  Alcohol abuse, diabetes   Additional history obtained:  Additional history obtained from patient External records from outside source obtained and reviewed including hospital records   Lab Tests:  I Ordered, and personally interpreted labs.  The pertinent results include: White count 17,000, T. bili 7.8   Imaging Studies ordered:  I ordered imaging studies including CT abdomen I independently visualized and interpreted imaging which showed cirrhosis I agree with the radiologist interpretation   Cardiac Monitoring: / EKG:  The patient was maintained on a cardiac monitor.  I personally viewed and interpreted the cardiac monitored which showed an underlying rhythm of: Normal sinus rhythm   Consultations Obtained:  I requested consultation with the GI and hospitalist,  and discussed lab and imaging findings as well as pertinent plan - they recommend: Admit to hospitalist with GI consult   Problem List / ED Course / Critical interventions / Medication management  Cirrhosis I ordered medication including antibiotics for possible  infection Reevaluation of the patient after these medicines showed that the patient stayed the same I have reviewed the patients home medicines and have made adjustments as needed   Social Determinants of Health:  None   Test / Admission - Considered:  None     I spoke with Dr. Tasia Catchings and he believes the patient is having alcoholic hepatitis.  She will be admitted to medicine with GI following tomorrow                               Medical Decision Making Amount and/or Complexity of Data Reviewed Labs: ordered. Radiology: ordered.  Risk Prescription drug management. Decision regarding hospitalization.   Cirrhosis with jaundice        Final Clinical Impression(s) / ED Diagnoses Final diagnoses:  Alcoholic cirrhosis of liver without ascites (HCC)  Jaundice    Rx / DC Orders ED Discharge Orders     None         Bethann Berkshire, MD 01/28/24 0900

## 2024-01-25 NOTE — ED Provider Triage Note (Signed)
Emergency Medicine Provider Triage Evaluation Note  Michelle Michael , a 63 y.o. female  was evaluated in triage.  Pt complains of jaundiced.  Review of Systems  Positive: Generalized weakness, jaundice Negative: Vomiting, pain  Physical Exam  BP (!) 103/50 (BP Location: Right Arm)   Pulse (!) 119   Temp 99 F (37.2 C) (Oral)   Resp (!) 22   SpO2 97%  Gen:   Awake, no distress  Scleral icterus Jaundiced in coloring Resp:  Normal effort  MSK:   Moves extremities without difficulty  Other:    Medical Decision Making  Medically screening exam initiated at 3:25 PM.  Appropriate orders placed.  [facility] was informed that the remainder of the evaluation will be completed by another provider, this initial triage assessment does not replace that evaluation, and the importance of remaining in the ED until their evaluation is complete.  History of liver disease "years ago".  Continues to drink alcohol.    Elpidio Anis, PA-C 01/25/24 1527

## 2024-01-25 NOTE — ED Triage Notes (Signed)
Pt arrived to [facility] for evaluation of weakness and jaundice. Pt reports she spoke with her PCP today and was advised to seek treatment in the ER. Pt reports weakness began 2 weeks ago.

## 2024-01-25 NOTE — Assessment & Plan Note (Addendum)
Presenting today for diabetes follow-up.  She endorses generalized weakness for the last 2 weeks that has significantly worsened within the last week.  She is jaundiced and ill-appearing on exam.  Known history of alcoholic cirrhosis and continues to drink alcohol.  She endorses increased alcohol consumption recently as she has felt hopeless about her current circumstances.  She currently requires assistance with daily activities such as getting out of a chair and putting on a pair of pants.  Given symptoms endorsed and clinical findings today, I recommended presenting to the emergency department for further evaluation.  She expressed understanding of these recommendations.  Will also notify gastroenterology of pending ED presentation.

## 2024-01-25 NOTE — Consult Note (Signed)
Pharmacy Antibiotic Note  Michelle Michael is a 63 y.o. female admitted on 01/25/2024 with intraabdominal infection.  Pharmacy has been consulted for Zosyn dosing.  Plan: Give Zosyn 3.375 grams IV x 1 (30 min infusion) , then start Zosyn 3.375 grams IV every 8 hours (4 hour infusion)  Height: 5\' 5"  (165.1 cm) Weight: 75.2 kg (165 lb 11.4 oz) IBW/kg (Calculated) : 57  Temp (24hrs), Avg:99 F (37.2 C), Min:99 F (37.2 C), Max:99 F (37.2 C)  Recent Labs  Lab 01/25/24 1635  WBC 16.8*  CREATININE 1.03*    Estimated Creatinine Clearance: 57.5 mL/min (A) (by C-G formula based on SCr of 1.03 mg/dL (H)).    No Known Allergies  Antimicrobials this admission: Zosyn 1/30 >>    Dose adjustments this admission: N/A  Microbiology results: N/A  Thank you for allowing pharmacy to be a part of this patient's care.  Barrie Folk 01/25/2024 8:49 PM

## 2024-01-25 NOTE — Patient Instructions (Signed)
It was a pleasure to see you today.  Thank you for giving [place] the opportunity to be involved in your care.  Below is a brief recap of your visit and next steps.    Summary As we discussed, I recommend presenting to the emergency department for evaluation. I will notify the GI office.  Follow up currently scheduled for April, but will plan for ER / hospital follow up before then.

## 2024-01-25 NOTE — ED Notes (Signed)
Patient oxygen saturation dropped in the 80s while asleep. Patient was in no distress and denied oxygen use at home and history of OSA. Patient placed on 2lpm via [place].

## 2024-01-25 NOTE — Progress Notes (Signed)
Acute Office Visit  Subjective:     Patient ID: Michelle Michael, female    DOB: May 21, 1961, 63 y.o.   MRN: [id number]  Chief Complaint  Patient presents with   Diabetes    Follow up    Michelle Michael returns to care today for diabetes follow up. Last evaluated by me through video encounter on 12/22/23. Mounjaro was added at that time due to difficulties with filling Victoza. There have been no acute interval events.  Today Michelle Michael reports feeling poorly.  She endorses a 2-week history of generalized weakness that has significantly worsened over the last week.  She is tearful during today's encounter, noting that she has never required assistance to get out of a chair or to put on a pair of pants.  She is unable to do both activities independently at the moment.  Denies fever/chills, nausea/vomiting, diarrhea, and pain.  She states that she has been drinking more alcohol recently because she has been sedentary and feels hopeless about her current circumstances.  Denies SI/HI.  Review of Systems  Neurological:  Positive for weakness (x 2 weeks).      Objective:    BP 98/65 (BP Location: Left Arm, Patient Position: Sitting, Cuff Size: Normal)   Pulse (!) 126   Ht 5\' 5"  (1.651 m)   Wt 165 lb 9.6 oz (75.1 kg)   SpO2 93%   BMI 27.56 kg/m   Physical Exam Vitals reviewed.  Constitutional:      Appearance: She is obese. She is ill-appearing.     Comments: Jaundiced  HENT:     Head: Normocephalic and atraumatic.     Right Ear: External ear normal.     Left Ear: External ear normal.     Nose: Nose normal. No congestion or rhinorrhea.     Mouth/Throat:     Mouth: Mucous membranes are moist.     Pharynx: Oropharynx is clear. No oropharyngeal exudate or posterior oropharyngeal erythema.  Eyes:     General: Scleral icterus present.     Extraocular Movements: Extraocular movements intact.     Conjunctiva/sclera: Conjunctivae normal.     Pupils: Pupils are equal, round, and reactive  to light.  Cardiovascular:     Rate and Rhythm: Normal rate and regular rhythm.     Pulses: Normal pulses.     Heart sounds: Normal heart sounds. No murmur heard.    No friction rub. No gallop.  Pulmonary:     Effort: Pulmonary effort is normal.     Breath sounds: Normal breath sounds. No wheezing, rhonchi or rales.  Abdominal:     General: Abdomen is flat. Bowel sounds are normal. There is no distension.     Palpations: Abdomen is soft.     Tenderness: There is no abdominal tenderness.  Musculoskeletal:        General: Swelling (Trace bilateral lower extremity edema) present. Normal range of motion.     Cervical back: Normal range of motion.     Right lower leg: Edema present.     Left lower leg: Edema present.  Lymphadenopathy:     Cervical: No cervical adenopathy.  Skin:    General: Skin is warm and dry.     Capillary Refill: Capillary refill takes less than 2 seconds.     Coloration: Skin is not jaundiced.  Neurological:     General: No focal deficit present.     Mental Status: She is alert and oriented to person, place, and time.  Comments: Diffuse tremor, not c/w asterixis  Psychiatric:        Behavior: Behavior normal.     Comments: Tearful during today's encounter       Assessment & Plan:   Problem List Items Addressed This Visit       Generalized weakness - Primary   Presenting today for diabetes follow-up.  She endorses generalized weakness for the last 2 weeks that has significantly worsened within the last week.  She is jaundiced and ill-appearing on exam.  Known history of alcoholic cirrhosis and continues to drink alcohol.  She endorses increased alcohol consumption recently as she has felt hopeless about her current circumstances.  She currently requires assistance with daily activities such as getting out of a chair and putting on a pair of pants.  Given symptoms endorsed and clinical findings today, I recommended presenting to the emergency department for  further evaluation.  She expressed understanding of these recommendations.  Will also notify gastroenterology of pending ED presentation.      Return if symptoms worsen or fail to improve.  Billie Lade, MD

## 2024-01-25 NOTE — ED Notes (Signed)
Patient transported to CT

## 2024-01-26 ENCOUNTER — Inpatient Hospital Stay (HOSPITAL_COMMUNITY): Payer: Medicare HMO

## 2024-01-26 ENCOUNTER — Encounter (HOSPITAL_COMMUNITY): Payer: Self-pay | Admitting: Family Medicine

## 2024-01-26 DIAGNOSIS — F419 Anxiety disorder, unspecified: Secondary | ICD-10-CM | POA: Insufficient documentation

## 2024-01-26 DIAGNOSIS — R935 Abnormal findings on diagnostic imaging of other abdominal regions, including retroperitoneum: Secondary | ICD-10-CM | POA: Diagnosis not present

## 2024-01-26 DIAGNOSIS — E1142 Type 2 diabetes mellitus with diabetic polyneuropathy: Secondary | ICD-10-CM | POA: Insufficient documentation

## 2024-01-26 DIAGNOSIS — K219 Gastro-esophageal reflux disease without esophagitis: Secondary | ICD-10-CM | POA: Insufficient documentation

## 2024-01-26 DIAGNOSIS — K259 Gastric ulcer, unspecified as acute or chronic, without hemorrhage or perforation: Secondary | ICD-10-CM | POA: Diagnosis not present

## 2024-01-26 DIAGNOSIS — R0902 Hypoxemia: Secondary | ICD-10-CM

## 2024-01-26 DIAGNOSIS — F1090 Alcohol use, unspecified, uncomplicated: Secondary | ICD-10-CM

## 2024-01-26 DIAGNOSIS — D500 Iron deficiency anemia secondary to blood loss (chronic): Secondary | ICD-10-CM

## 2024-01-26 DIAGNOSIS — E876 Hypokalemia: Secondary | ICD-10-CM | POA: Diagnosis not present

## 2024-01-26 DIAGNOSIS — K3189 Other diseases of stomach and duodenum: Secondary | ICD-10-CM | POA: Diagnosis not present

## 2024-01-26 DIAGNOSIS — R188 Other ascites: Secondary | ICD-10-CM | POA: Diagnosis not present

## 2024-01-26 DIAGNOSIS — K222 Esophageal obstruction: Secondary | ICD-10-CM | POA: Diagnosis not present

## 2024-01-26 DIAGNOSIS — K766 Portal hypertension: Secondary | ICD-10-CM | POA: Diagnosis not present

## 2024-01-26 DIAGNOSIS — F32A Depression, unspecified: Secondary | ICD-10-CM | POA: Insufficient documentation

## 2024-01-26 DIAGNOSIS — K7011 Alcoholic hepatitis with ascites: Secondary | ICD-10-CM | POA: Diagnosis not present

## 2024-01-26 DIAGNOSIS — K7031 Alcoholic cirrhosis of liver with ascites: Secondary | ICD-10-CM | POA: Diagnosis not present

## 2024-01-26 DIAGNOSIS — J449 Chronic obstructive pulmonary disease, unspecified: Secondary | ICD-10-CM | POA: Insufficient documentation

## 2024-01-26 DIAGNOSIS — R531 Weakness: Secondary | ICD-10-CM | POA: Diagnosis not present

## 2024-01-26 DIAGNOSIS — D72829 Elevated white blood cell count, unspecified: Secondary | ICD-10-CM

## 2024-01-26 DIAGNOSIS — E785 Hyperlipidemia, unspecified: Secondary | ICD-10-CM | POA: Insufficient documentation

## 2024-01-26 DIAGNOSIS — D649 Anemia, unspecified: Secondary | ICD-10-CM

## 2024-01-26 HISTORY — DX: Hypoxemia: R09.02

## 2024-01-26 LAB — HEPATITIS PANEL, ACUTE
HCV Ab: NONREACTIVE
Hep A IgM: NONREACTIVE
Hep B C IgM: NONREACTIVE
Hepatitis B Surface Ag: NONREACTIVE

## 2024-01-26 LAB — CBC
HCT: 23 % — ABNORMAL LOW (ref 36.0–46.0)
Hemoglobin: 8.1 g/dL — ABNORMAL LOW (ref 12.0–15.0)
MCH: 38.4 pg — ABNORMAL HIGH (ref 26.0–34.0)
MCHC: 35.2 g/dL (ref 30.0–36.0)
MCV: 109 fL — ABNORMAL HIGH (ref 80.0–100.0)
Platelets: 114 10*3/uL — ABNORMAL LOW (ref 150–400)
RBC: 2.11 MIL/uL — ABNORMAL LOW (ref 3.87–5.11)
RDW: 14.6 % (ref 11.5–15.5)
WBC: 11.5 10*3/uL — ABNORMAL HIGH (ref 4.0–10.5)
nRBC: 0 % (ref 0.0–0.2)

## 2024-01-26 LAB — GLUCOSE, CAPILLARY
Glucose-Capillary: 140 mg/dL — ABNORMAL HIGH (ref 70–99)
Glucose-Capillary: 142 mg/dL — ABNORMAL HIGH (ref 70–99)
Glucose-Capillary: 145 mg/dL — ABNORMAL HIGH (ref 70–99)

## 2024-01-26 LAB — BODY FLUID CELL COUNT WITH DIFFERENTIAL
Eos, Fluid: 0 %
Lymphs, Fluid: 6 %
Monocyte-Macrophage-Serous Fluid: 74 % (ref 50–90)
Neutrophil Count, Fluid: 20 % (ref 0–25)
Total Nucleated Cell Count, Fluid: 133 uL (ref 0–1000)

## 2024-01-26 LAB — GRAM STAIN

## 2024-01-26 LAB — VITAMIN B12: Vitamin B-12: 1006 pg/mL — ABNORMAL HIGH (ref 180–914)

## 2024-01-26 LAB — HEPATIC FUNCTION PANEL
ALT: 22 U/L (ref 0–44)
AST: 51 U/L — ABNORMAL HIGH (ref 15–41)
Albumin: 2.1 g/dL — ABNORMAL LOW (ref 3.5–5.0)
Alkaline Phosphatase: 130 U/L — ABNORMAL HIGH (ref 38–126)
Bilirubin, Direct: 3.2 mg/dL — ABNORMAL HIGH (ref 0.0–0.2)
Indirect Bilirubin: 5.4 mg/dL — ABNORMAL HIGH (ref 0.3–0.9)
Total Bilirubin: 8.6 mg/dL — ABNORMAL HIGH (ref 0.0–1.2)
Total Protein: 5.7 g/dL — ABNORMAL LOW (ref 6.5–8.1)

## 2024-01-26 LAB — RETICULOCYTES
Immature Retic Fract: 21.8 % — ABNORMAL HIGH (ref 2.3–15.9)
RBC.: 2.11 MIL/uL — ABNORMAL LOW (ref 3.87–5.11)
Retic Count, Absolute: 86.5 10*3/uL (ref 19.0–186.0)
Retic Ct Pct: 4.1 % — ABNORMAL HIGH (ref 0.4–3.1)

## 2024-01-26 LAB — IRON AND TIBC: Iron: 101 ug/dL (ref 28–170)

## 2024-01-26 LAB — HIV ANTIBODY (ROUTINE TESTING W REFLEX): HIV Screen 4th Generation wRfx: NONREACTIVE

## 2024-01-26 LAB — BASIC METABOLIC PANEL
Anion gap: 9 (ref 5–15)
BUN: 10 mg/dL (ref 8–23)
CO2: 27 mmol/L (ref 22–32)
Calcium: 7.3 mg/dL — ABNORMAL LOW (ref 8.9–10.3)
Chloride: 94 mmol/L — ABNORMAL LOW (ref 98–111)
Creatinine, Ser: 1.08 mg/dL — ABNORMAL HIGH (ref 0.44–1.00)
GFR, Estimated: 58 mL/min — ABNORMAL LOW (ref >60)
Glucose, Bld: 133 mg/dL — ABNORMAL HIGH (ref 70–99)
Potassium: 3.4 mmol/L — ABNORMAL LOW (ref 3.5–5.1)
Sodium: 130 mmol/L — ABNORMAL LOW (ref 135–145)

## 2024-01-26 LAB — LACTATE DEHYDROGENASE: LDH: 231 U/L — ABNORMAL HIGH (ref 98–192)

## 2024-01-26 LAB — FERRITIN: Ferritin: 885 ng/mL — ABNORMAL HIGH (ref 11–307)

## 2024-01-26 LAB — FOLATE: Folate: 3.7 ng/mL — ABNORMAL LOW (ref >5.9)

## 2024-01-26 LAB — MAGNESIUM: Magnesium: 0.9 mg/dL — CL (ref 1.7–2.4)

## 2024-01-26 LAB — D-DIMER, QUANTITATIVE: D-Dimer, Quant: 6.98 ug{FEU}/mL — ABNORMAL HIGH (ref 0.00–0.50)

## 2024-01-26 MED ORDER — FLUTICASONE PROPIONATE 50 MCG/ACT NA SUSP
2.0000 | Freq: Every day | NASAL | Status: DC
  Administered 2024-01-26 – 2024-02-01 (×7): 2 via NASAL
  Filled 2024-01-26: qty 16

## 2024-01-26 MED ORDER — ONDANSETRON HCL 4 MG/2ML IJ SOLN: 4.0000 mg | Freq: Four times a day (QID) | INTRAMUSCULAR | Status: DC | PRN

## 2024-01-26 MED ORDER — PANTOPRAZOLE SODIUM 40 MG IV SOLR
40.0000 mg | Freq: Once | INTRAVENOUS | Status: AC
  Administered 2024-01-26: 40 mg via INTRAVENOUS
  Filled 2024-01-26: qty 10

## 2024-01-26 MED ORDER — LORAZEPAM 1 MG PO TABS: 1.0000 mg | ORAL_TABLET | ORAL | Status: DC | PRN

## 2024-01-26 MED ORDER — GABAPENTIN 300 MG PO CAPS
600.0000 mg | ORAL_CAPSULE | Freq: Three times a day (TID) | ORAL | Status: DC
  Administered 2024-01-26 – 2024-01-30 (×13): 600 mg via ORAL
  Filled 2024-01-26 (×14): qty 2

## 2024-01-26 MED ORDER — SODIUM CHLORIDE 0.9 % IV SOLN: INTRAVENOUS | Status: DC

## 2024-01-26 MED ORDER — GABAPENTIN 300 MG PO CAPS
300.0000 mg | ORAL_CAPSULE | Freq: Once | ORAL | Status: AC
  Administered 2024-01-26: 300 mg via ORAL
  Filled 2024-01-26: qty 1

## 2024-01-26 MED ORDER — POTASSIUM CHLORIDE 20 MEQ PO PACK
40.0000 meq | PACK | Freq: Once | ORAL | Status: AC
  Administered 2024-01-26: 40 meq via ORAL
  Filled 2024-01-26: qty 2

## 2024-01-26 MED ORDER — POTASSIUM CHLORIDE IN NACL 20-0.9 MEQ/L-% IV SOLN
INTRAVENOUS | Status: AC
  Filled 2024-01-26: qty 1000

## 2024-01-26 MED ORDER — INSULIN ASPART PROT & ASPART (70-30 MIX) 100 UNIT/ML ~~LOC~~ SUSP
15.0000 [IU] | Freq: Two times a day (BID) | SUBCUTANEOUS | Status: DC
Start: 2024-01-26 — End: 2024-01-26
  Filled 2024-01-26: qty 10

## 2024-01-26 MED ORDER — ARFORMOTEROL TARTRATE 15 MCG/2ML IN NEBU
15.0000 ug | INHALATION_SOLUTION | Freq: Two times a day (BID) | RESPIRATORY_TRACT | Status: DC
  Administered 2024-01-26 – 2024-01-30 (×9): 15 ug via RESPIRATORY_TRACT
  Filled 2024-01-26 (×12): qty 2

## 2024-01-26 MED ORDER — SODIUM CHLORIDE 0.9 % IV BOLUS
250.0000 mL | Freq: Once | INTRAVENOUS | Status: AC
  Administered 2024-01-26: 250 mL via INTRAVENOUS

## 2024-01-26 MED ORDER — LORAZEPAM 2 MG/ML IJ SOLN: 1.0000 mg | INTRAMUSCULAR | Status: DC | PRN

## 2024-01-26 MED ORDER — THIAMINE MONONITRATE 100 MG PO TABS
100.0000 mg | ORAL_TABLET | Freq: Every day | ORAL | Status: DC
  Administered 2024-01-26 – 2024-01-31 (×6): 100 mg via ORAL
  Filled 2024-01-26 (×6): qty 1

## 2024-01-26 MED ORDER — FOLIC ACID 1 MG PO TABS
1.0000 mg | ORAL_TABLET | Freq: Every day | ORAL | Status: DC
  Administered 2024-01-26 – 2024-01-31 (×6): 1 mg via ORAL
  Filled 2024-01-26 (×7): qty 1

## 2024-01-26 MED ORDER — SODIUM CHLORIDE 0.9 % IV SOLN
1.0000 g | INTRAVENOUS | Status: AC
  Administered 2024-01-26 – 2024-01-30 (×5): 1 g via INTRAVENOUS
  Filled 2024-01-26 (×5): qty 10

## 2024-01-26 MED ORDER — UMECLIDINIUM BROMIDE 62.5 MCG/ACT IN AEPB
1.0000 | INHALATION_SPRAY | Freq: Every day | RESPIRATORY_TRACT | Status: DC
  Administered 2024-01-26 – 2024-01-30 (×5): 1 via RESPIRATORY_TRACT
  Filled 2024-01-26: qty 7

## 2024-01-26 MED ORDER — LIDOCAINE HCL (PF) 2 % IJ SOLN
INTRAMUSCULAR | Status: AC
  Filled 2024-01-26: qty 10

## 2024-01-26 MED ORDER — ACETAMINOPHEN 325 MG PO TABS
650.0000 mg | ORAL_TABLET | Freq: Four times a day (QID) | ORAL | Status: DC | PRN
  Administered 2024-01-28: 650 mg via ORAL
  Filled 2024-01-26: qty 2

## 2024-01-26 MED ORDER — INSULIN ISOPHANE & REGULAR (HUMAN 70-30)100 UNIT/ML KWIKPEN: 15.0000 [IU] | PEN_INJECTOR | Freq: Two times a day (BID) | SUBCUTANEOUS | Status: DC

## 2024-01-26 MED ORDER — INSULIN ASPART 100 UNIT/ML IJ SOLN
0.0000 [IU] | INTRAMUSCULAR | Status: DC
  Administered 2024-01-27 – 2024-01-28 (×2): 1 [IU] via SUBCUTANEOUS
  Administered 2024-01-28: 2 [IU] via SUBCUTANEOUS
  Administered 2024-01-28 – 2024-01-30 (×6): 1 [IU] via SUBCUTANEOUS

## 2024-01-26 MED ORDER — ACETAMINOPHEN 650 MG RE SUPP: 650.0000 mg | Freq: Four times a day (QID) | RECTAL | Status: DC | PRN

## 2024-01-26 MED ORDER — DULOXETINE HCL 60 MG PO CPEP
60.0000 mg | ORAL_CAPSULE | Freq: Every day | ORAL | Status: DC
  Administered 2024-01-26 – 2024-01-31 (×6): 60 mg via ORAL
  Filled 2024-01-26 (×4): qty 1
  Filled 2024-01-26: qty 2
  Filled 2024-01-26 (×2): qty 1

## 2024-01-26 MED ORDER — FUROSEMIDE 40 MG PO TABS
40.0000 mg | ORAL_TABLET | Freq: Two times a day (BID) | ORAL | Status: DC
  Administered 2024-01-26: 40 mg via ORAL
  Filled 2024-01-26: qty 1

## 2024-01-26 MED ORDER — FUROSEMIDE 40 MG PO TABS: 40.0000 mg | ORAL_TABLET | Freq: Two times a day (BID) | ORAL | Status: DC

## 2024-01-26 MED ORDER — ALBUTEROL SULFATE (2.5 MG/3ML) 0.083% IN NEBU: 2.5000 mg | INHALATION_SOLUTION | RESPIRATORY_TRACT | Status: DC | PRN

## 2024-01-26 MED ORDER — MAGNESIUM SULFATE 4 GM/100ML IV SOLN
4.0000 g | Freq: Once | INTRAVENOUS | Status: AC
  Administered 2024-01-26: 4 g via INTRAVENOUS
  Filled 2024-01-26: qty 100

## 2024-01-26 MED ORDER — ALBUTEROL SULFATE HFA 108 (90 BASE) MCG/ACT IN AERS: 2.0000 | INHALATION_SPRAY | RESPIRATORY_TRACT | Status: DC | PRN

## 2024-01-26 MED ORDER — ONDANSETRON HCL 4 MG PO TABS: 4.0000 mg | ORAL_TABLET | Freq: Four times a day (QID) | ORAL | Status: DC | PRN

## 2024-01-26 MED ORDER — TRAZODONE HCL 50 MG PO TABS
100.0000 mg | ORAL_TABLET | Freq: Every day | ORAL | Status: DC
  Administered 2024-01-26 – 2024-01-30 (×4): 100 mg via ORAL
  Filled 2024-01-26 (×5): qty 2

## 2024-01-26 MED ORDER — POTASSIUM CHLORIDE 20 MEQ PO PACK
20.0000 meq | PACK | Freq: Every day | ORAL | Status: DC
  Filled 2024-01-26: qty 1

## 2024-01-26 MED ORDER — SPIRONOLACTONE 100 MG PO TABS
100.0000 mg | ORAL_TABLET | Freq: Every day | ORAL | Status: DC
  Administered 2024-01-26 – 2024-01-31 (×6): 100 mg via ORAL
  Filled 2024-01-26 (×7): qty 1

## 2024-01-26 MED ORDER — PANTOPRAZOLE SODIUM 40 MG IV SOLR
40.0000 mg | Freq: Two times a day (BID) | INTRAVENOUS | Status: DC
  Administered 2024-01-26 – 2024-02-02 (×14): 40 mg via INTRAVENOUS
  Filled 2024-01-26 (×13): qty 10

## 2024-01-26 MED ORDER — TRAZODONE HCL 50 MG PO TABS: 25.0000 mg | ORAL_TABLET | Freq: Every evening | ORAL | Status: DC | PRN

## 2024-01-26 MED ORDER — THIAMINE HCL 100 MG/ML IJ SOLN
100.0000 mg | Freq: Every day | INTRAMUSCULAR | Status: DC
  Administered 2024-02-01: 100 mg via INTRAVENOUS
  Filled 2024-01-26 (×2): qty 2

## 2024-01-26 MED ORDER — PANTOPRAZOLE SODIUM 40 MG IV SOLR: 40.0000 mg | INTRAVENOUS | Status: DC

## 2024-01-26 MED ORDER — MAGNESIUM HYDROXIDE 400 MG/5ML PO SUSP: 30.0000 mL | Freq: Every day | ORAL | Status: DC | PRN

## 2024-01-26 MED ORDER — ADULT MULTIVITAMIN W/MINERALS CH
1.0000 | ORAL_TABLET | Freq: Every day | ORAL | Status: DC
  Administered 2024-01-26 – 2024-01-31 (×6): 1 via ORAL
  Filled 2024-01-26 (×7): qty 1

## 2024-01-26 MED ORDER — INSULIN ASPART PROT & ASPART (70-30 MIX) 100 UNIT/ML ~~LOC~~ SUSP
15.0000 [IU] | Freq: Two times a day (BID) | SUBCUTANEOUS | Status: DC
  Administered 2024-01-27 – 2024-02-01 (×9): 15 [IU] via SUBCUTANEOUS

## 2024-01-26 MED ORDER — BUSPIRONE HCL 5 MG PO TABS
10.0000 mg | ORAL_TABLET | Freq: Three times a day (TID) | ORAL | Status: DC
  Administered 2024-01-26 – 2024-01-30 (×12): 10 mg via ORAL
  Filled 2024-01-26 (×13): qty 2

## 2024-01-26 NOTE — Assessment & Plan Note (Signed)
-   She will be on IV PPI therapy as mentioned above.

## 2024-01-26 NOTE — Evaluation (Signed)
Physical Therapy Evaluation Patient Details Name: Michelle Michael MRN: [id number] DOB: January 05, 1961 Today's Date: 01/26/2024  History of Present Illness  Michelle Michael is a 63 y.o. female with medical history significant for COPD, type 2 diabetes mellitus, GERD, hypertension, dyslipidemia, depression and anxiety, who presented to the emergency room with acute onset of generalized weakness and chills without reported fever as well as jaundice.  She was seen by Dr. Durwin Nora her primary care physician and given her jaundice was referred to the emergency room.  She denied any chest pain however has been having occasional dyspnea and dry cough.  She admitted to nausea and vomiting earlier today.  She admits to heartburn without significant abdominal pain.  No melena or bright red bleeding per rectum.  No bilious vomitus or hematemesis.  No other bleeding diathesis.   Clinical Impression  Patient was agreeable to therapy. Was able to perform bed mobility with min/mod assist. Transfers and ambulation was performed with RW and mod assist. During ambulation patient was unsteady and relied heavily on RW. Ambulation distance was limited due to fatigue. Patient was left in bed at conclusion of session. Patient will benefit from continued skilled physical therapy in hospital and recommended venue below to increase strength, balance, endurance for safe ADLs and gait.        If plan is discharge home, recommend the following: A little help with walking and/or transfers;Help with stairs or ramp for entrance;Assistance with cooking/housework;A little help with bathing/dressing/bathroom   Can travel by private vehicle        Equipment Recommendations None recommended by PT  Recommendations for Other Services       Functional Status Assessment Patient has had a recent decline in their functional status and demonstrates the ability to make significant improvements in function in a reasonable and predictable  amount of time.     Precautions / Restrictions Precautions Precautions: Fall Restrictions Weight Bearing Restrictions Per Provider Order: No      Mobility  Bed Mobility Overal bed mobility: Needs Assistance Bed Mobility: Supine to Sit, Sit to Supine     Supine to sit: Min assist, Mod assist Sit to supine: Min assist, Mod assist     Patient Response: Cooperative  Transfers Overall transfer level: Needs assistance Equipment used: Rolling walker (2 wheels) Transfers: Sit to/from Stand Sit to Stand: Mod assist                Ambulation/Gait Ambulation/Gait assistance: Mod assist Gait Distance (Feet): 25 Feet Assistive device: Rolling walker (2 wheels) Gait Pattern/deviations: Step-to pattern, Decreased step length - right, Decreased step length - left, Decreased stride length Gait velocity: slow     General Gait Details: slow labored movements  Stairs            Wheelchair Mobility     Tilt Bed Tilt Bed Patient Response: Cooperative  Modified Rankin (Stroke Patients Only)       Balance Overall balance assessment: Needs assistance Sitting-balance support: Bilateral upper extremity supported, Feet unsupported Sitting balance-Leahy Scale: Good     Standing balance support: Bilateral upper extremity supported, Reliant on assistive device for balance Standing balance-Leahy Scale: Fair                               Pertinent Vitals/Pain Pain Assessment Pain Assessment: 0-10 Pain Score: 5  Pain Location: low back Pain Descriptors / Indicators: Aching Pain Intervention(s): Limited activity within patient's tolerance, Monitored  during session    Home Living Family/patient expects to be discharged to:: Private residence Living Arrangements: Non-relatives/Friends Available Help at Discharge: Friend(s) Type of Home: House Home Access: Stairs to enter Entrance Stairs-Rails: Can reach both;Right;Left Entrance Stairs-Number of Steps:  3   Home Layout: One level Home Equipment: Agricultural consultant (2 wheels)      Prior Function Prior Level of Function : Independent/Modified Independent                     Extremity/Trunk Assessment   Upper Extremity Assessment Upper Extremity Assessment: Defer to OT evaluation    Lower Extremity Assessment Lower Extremity Assessment: Generalized weakness       Communication   Communication Communication: No apparent difficulties Cueing Techniques: Verbal cues;Tactile cues  Cognition Arousal: Alert Behavior During Therapy: WFL for tasks assessed/performed Overall Cognitive Status: Within Functional Limits for tasks assessed                                          General Comments      Exercises     Assessment/Plan    PT Assessment Patient needs continued PT services  PT Problem List Decreased strength;Decreased range of motion;Decreased activity tolerance;Decreased balance;Decreased mobility       PT Treatment Interventions DME instruction;Gait training;Patient/family education;Stair training;Functional mobility training;Therapeutic activities;Therapeutic exercise;Balance training    PT Goals (Current goals can be found in the Care Plan section)  Acute Rehab PT Goals Patient Stated Goal: to return home PT Goal Formulation: With patient Time For Goal Achievement: 02/09/24 Potential to Achieve Goals: Good    Frequency Min 3X/week     Co-evaluation               AM-PAC PT "6 Clicks" Mobility  Outcome Measure Help needed turning from your back to your side while in a flat bed without using bedrails?: A Little Help needed moving from lying on your back to sitting on the side of a flat bed without using bedrails?: A Little Help needed moving to and from a bed to a chair (including a wheelchair)?: A Lot Help needed standing up from a chair using your arms (e.g., wheelchair or bedside chair)?: A Lot Help needed to walk in hospital  room?: A Little Help needed climbing 3-5 steps with a railing? : Total 6 Click Score: 14    End of Session   Activity Tolerance: Patient tolerated treatment well Patient left: in bed;with call bell/phone within reach Nurse Communication: Mobility status PT Visit Diagnosis: Unsteadiness on feet (R26.81);Other abnormalities of gait and mobility (R26.89);Muscle weakness (generalized) (M62.81)    Time: 4098-1191 PT Time Calculation (min) (ACUTE ONLY): 23 min   Charges:   PT Evaluation $PT Eval Moderate Complexity: 1 Mod PT Treatments $Therapeutic Activity: 23-37 mins PT General Charges $$ ACUTE PT VISIT: 1 Visit         Lennard Capek SPT

## 2024-01-26 NOTE — [place] FL2 (Signed)
[place] MEDICAID FL2 LEVEL OF CARE FORM     IDENTIFICATION  Patient Name: Michelle Michael Birthdate: 1961-02-05 Sex: female Admission Date (Current Location): 01/25/2024  [facility] and [place] Number:  [employer] and Address:  [facility],  [address], [name] [address]      Provider Number: [id number]  Attending Physician Name and Address:  Joseph Art, DO  Relative Name and Phone Number:       Current Level of Care: Hospital Recommended Level of Care: Skilled Nursing Facility Prior Approval Number:    Date Approved/Denied:   PASRR Number: [id number]  Discharge Plan: SNF    Current Diagnoses: Patient Active Problem List   Diagnosis Date Noted   Symptomatic anemia 01/26/2024   Leukocytosis 01/26/2024   Abnormal CT of the abdomen 01/26/2024   Anxiety and depression 01/26/2024   Type 2 diabetes mellitus with peripheral neuropathy (HCC) 01/26/2024   Dyslipidemia 01/26/2024   Chronic obstructive pulmonary disease (COPD) (HCC) 01/26/2024   GERD (gastroesophageal reflux disease) 01/26/2024   Hypoxemia 01/26/2024   Generalized weakness 01/25/2024   Neuropathy 03/30/2023   Centrilobular emphysema (HCC) 03/22/2023   Hearing impaired 12/15/2022   Fatigue 12/15/2022   Annual physical exam 07/15/2022   Locking finger joint 07/15/2022   Leg cramping 07/15/2022   Bilateral lower extremity edema 07/15/2022   Cirrhosis (HCC) 04/14/2022   Diabetes mellitus, type II (HCC) 04/14/2022   Dyslipidemia, goal LDL below 70 04/14/2022   Hypertension 04/14/2022   GAD (generalized anxiety disorder) 04/14/2022   Current every day smoker 04/14/2022   ETOH abuse 06/21/2015   Major depressive disorder, recurrent, severe without psychotic features (HCC)    Alcohol use disorder, severe, dependence (HCC)    Alcoholism (HCC) 06/17/2015   Hypokalemia 06/17/2015   Substance induced mood disorder (HCC) 06/17/2015    Orientation RESPIRATION BLADDER Height  & Weight     Self, Time, Situation, Place  Normal Continent Weight: 165 lb 11.4 oz (75.2 kg) Height:  5\' 5"  (165.1 cm)  BEHAVIORAL SYMPTOMS/MOOD NEUROLOGICAL BOWEL NUTRITION STATUS      Continent Diet (See D/C summary)  AMBULATORY STATUS COMMUNICATION OF NEEDS Skin   Extensive Assist Verbally Normal                       Personal Care Assistance Level of Assistance  Bathing, Feeding, Dressing Bathing Assistance: Limited assistance Feeding assistance: Independent Dressing Assistance: Limited assistance     Functional Limitations Info  Sight, Hearing, Speech Sight Info: Impaired Hearing Info: Adequate Speech Info: Adequate    SPECIAL CARE FACTORS FREQUENCY  PT (By licensed PT), OT (By licensed OT)     PT Frequency: 5 times weekly OT Frequency: 5 times weekly            Contractures Contractures Info: Not present    Additional Factors Info  Code Status, Allergies Code Status Info: FULL Allergies Info: NKA           Current Medications (01/26/2024):  This is the current hospital active medication list Current Facility-Administered Medications  Medication Dose Route Frequency Provider Last Rate Last Admin   0.9 %  sodium chloride infusion   Intravenous Continuous Mansy, Jan A, MD   Stopped at 01/26/24 0729   0.9 % NaCl with KCl 20 mEq/ L  infusion   Intravenous Continuous Mansy, Jan A, MD 100 mL/hr at 01/26/24 0730 New Bag at 01/26/24 0730   acetaminophen (TYLENOL) tablet 650 mg  650 mg  Oral Q6H PRN Mansy, Vernetta Honey, MD       Or   acetaminophen (TYLENOL) suppository 650 mg  650 mg Rectal Q6H PRN Mansy, Jan A, MD       albuterol (PROVENTIL) (2.5 MG/3ML) 0.083% nebulizer solution 2.5 mg  2.5 mg Nebulization Q4H PRN Mansy, Jan A, MD       arformoterol [facility]) nebulizer solution 15 mcg  15 mcg Nebulization BID Mansy, Jan A, MD   15 mcg at 01/26/24 0909   And   umeclidinium bromide (INCRUSE ELLIPTA) 62.5 MCG/ACT 1 puff  1 puff Inhalation Daily Mansy, Jan A, MD   1  puff at 01/26/24 9604   busPIRone (BUSPAR) tablet 10 mg  10 mg Oral TID Mansy, Jan A, MD       cefTRIAXone (ROCEPHIN) 1 g in sodium chloride 0.9 % 100 mL IVPB  1 g Intravenous Q24H Brooke Bonito L, NP       DULoxetine (CYMBALTA) DR capsule 60 mg  60 mg Oral Daily Mansy, Jan A, MD   60 mg at 01/26/24 0909   fluticasone (FLONASE) 50 MCG/ACT nasal spray 2 spray  2 spray Each Nare Daily Mansy, Jan A, MD   2 spray at 01/26/24 0912   furosemide (LASIX) tablet 40 mg  40 mg Oral BID Mansy, Jan A, MD   40 mg at 01/26/24 0730   gabapentin (NEURONTIN) capsule 600 mg  600 mg Oral TID Mansy, Jan A, MD   600 mg at 01/26/24 5409   insulin aspart protamine- aspart (NOVOLOG MIX 70/30) injection 15 Units  15 Units Subcutaneous BID WC Mansy, Jan A, MD       magnesium hydroxide (MILK OF MAGNESIA) suspension 30 mL  30 mL Oral Daily PRN Mansy, Jan A, MD       ondansetron [facility]) tablet 4 mg  4 mg Oral Q6H PRN Mansy, Jan A, MD       Or   ondansetron [facility]) injection 4 mg  4 mg Intravenous Q6H PRN Mansy, Jan A, MD       potassium chloride (KLOR-CON) packet 20 mEq  20 mEq Oral Daily Mansy, Jan A, MD       spironolactone (ALDACTONE) tablet 100 mg  100 mg Oral Daily Mansy, Jan A, MD   100 mg at 01/26/24 8119   traZODone (DESYREL) tablet 100 mg  100 mg Oral QHS Mansy, Jan A, MD       traZODone (DESYREL) tablet 25 mg  25 mg Oral QHS PRN Mansy, Vernetta Honey, MD       Current Outpatient Medications  Medication Sig Dispense Refill   busPIRone (BUSPAR) 10 MG tablet Take 10 mg by mouth 3 (three) times daily.     DULoxetine (CYMBALTA) 60 MG capsule Take 60 mg by mouth daily.     fluticasone (FLONASE) 50 MCG/ACT nasal spray USE 2 SPRAYS IN EACH NOSTRIL EVERY DAY 16 g 11   furosemide (LASIX) 40 MG tablet Take 1 tablet (40 mg total) by mouth 2 (two) times daily. 180 tablet 1   gabapentin (NEURONTIN) 300 MG capsule Take 1 capsule (300 mg total) by mouth once for 1 dose. (Patient taking differently: Take 300 mg by mouth once.) 90  capsule 0   gabapentin (NEURONTIN) 600 MG tablet Take 1 tablet (600 mg total) by mouth 3 (three) times daily. 180 tablet 0   metFORMIN (GLUCOPHAGE-XR) 500 MG 24 hr tablet Take 1 tablet (500 mg total) by mouth daily with breakfast. 90 tablet 3  ondansetron (ZOFRAN) 4 MG tablet Take 1 tablet (4 mg total) by mouth every 8 (eight) hours as needed. 20 tablet 0   pantoprazole (PROTONIX) 40 MG tablet Take 1 tablet (40 mg total) by mouth 2 (two) times daily. 180 tablet 3   Potassium Chloride 40 MEQ/15ML (20%) SOLN Take 7.5 mLs by mouth daily. 473 mL 0   REPATHA 140 MG/ML SOSY INJECT 140MG  UNDER THE SKIN EVERY 14 DAYS 2 mL 11   spironolactone (ALDACTONE) 100 MG tablet Take 1 tablet (100 mg total) by mouth daily. 90 tablet 3   STIOLTO RESPIMAT 2.5-2.5 MCG/ACT AERS INHALE 2 PUFFS BY MOUTH ONCE DAILY 4 g 0   tirzepatide (MOUNJARO) 2.5 MG/0.5ML Pen INJECT 2.5MG  (1 PEN) UNDER THE SKIN EVERY WEEK 0.5 mL 11   traZODone (DESYREL) 100 MG tablet Take 100 mg by mouth at bedtime.     VENTOLIN HFA 108 (90 Base) MCG/ACT inhaler INHALE 2 PUFFS BY MOUTH EVERY 6 HOURS AS NEEDED FOR WHEEZING OR SHORTNESS OF BREATH 18 g 0   Continuous Glucose Receiver (FREESTYLE LIBRE 3 READER) DEVI 1 each by Other route See admin instructions. 1 each 0   Continuous Glucose Sensor (FREESTYLE LIBRE 3 PLUS SENSOR) MISC Change sensor every 15 days. 6 each 3   folic acid (FOLVITE) 400 MCG tablet Take 400 mcg by mouth daily. (Patient not taking: Reported on 01/26/2024)     Glucagon, rDNA, (GLUCAGON EMERGENCY) 1 MG KIT Inject 1 mg into the muscle as needed (low blood sugar).     insulin isophane & regular human KwikPen (NOVOLIN 70/30 KWIKPEN) (70-30) 100 UNIT/ML KwikPen Inject 15 Units into the skin in the morning and at bedtime. 25 mL 3   Insulin Pen Needle (BD PEN NEEDLE NANO 2ND GEN) 32G X 4 MM MISC Use to inject insulin and Victoza for 3 injections per day 1000 each 6   INSULIN SYRINGE .5CC/29G 29G X 1/2" 0.5 ML MISC Use to inject insulin twice  daily. 100 each 3     Discharge Medications: Please see discharge summary for a list of discharge medications.  Relevant Imaging Results:  Relevant Lab Results:   Additional Information SSN: [id number] [place]

## 2024-01-26 NOTE — H&P (Addendum)
[place]   PATIENT NAME: Michelle Michael    MR#:  [id number]  DATE OF BIRTH:  1961/11/02  DATE OF ADMISSION:  01/25/2024  PRIMARY CARE PHYSICIAN: Billie Lade, MD   Patient is coming from: Home  REQUESTING/REFERRING PHYSICIAN: Bethann Berkshire, MD  CHIEF COMPLAINT:   Chief Complaint  Patient presents with   Weakness    HISTORY OF PRESENT ILLNESS:  Michelle Michael is a 63 y.o. female with medical history significant for COPD, type 2 diabetes mellitus, GERD, hypertension, dyslipidemia, depression and anxiety, who presented to the emergency room with acute onset of generalized weakness and chills without reported fever as well as jaundice.  She was seen by her primary care physician and given her jaundice was referred to the emergency room.  She denied any chest pain however has been having occasional dyspnea and dry cough.  She admitted to nausea and vomiting earlier today.  She admits to heartburn without significant abdominal pain.  No melena or bright red bleeding per rectum.  No bilious vomitus or hematemesis.  No other bleeding diathesis.  ED Course: When she came to the ER heart rate was 126 and later 119 with respiratory to 22 and BP 98/65 and later 103/50, temperature 99 and pulse oxymetry 97% on room air.  The patient's pulse oximetry was down to 89% while asleep.  Labs revealed hyponatremia 130 and chloride of 90 with blood glucose of 146, calcium of 8.1 and anion gap of 16 with CO2 of 24.  Alk phos was 157 and albumin 2.3 with total protein and of 6.6.  AST was 63 with ALT of 20 and total bili 7.8 with direct bili of 2.9 and indirect of 4.9.  CBC showed leukocytosis 16.8 with neutrophilia as well as anemia worse than previous levels with hemoglobin 9.2 hematocrit 27.7 with macrocytosis.  INR is 1.6 and PT 18.8. EKG as reviewed by me : .EKG showed sinus tachycardia with a rate of 119 with occasional PVCs and low voltage QRS.   Imaging: Abdominal and pelvic CT scan with  contrast revealed the following: 1. Question thinning and slight hazy contour of the greater curvature. Underlying ulceration is not excluded. No bowel perforation. Consider correlation with direct visualization. 2. Cirrhosis. No focal liver lesions identified. Please note that liver protocol enhanced MR and CT are the most sensitive tests for the screening detection of hepatocellular carcinoma in the high risk setting of cirrhosis. 3. Moderate volume simple free fluid ascites. 4.  Aortic Atherosclerosis  Portable chest x-ray showed no acute cardiopulmonary disease. She was given 1 L bolus of IV normal saline and IV Zosyn.  The patient will be admitted to a medical bed for further evaluation and management. PAST MEDICAL HISTORY:   Past Medical History:  Diagnosis Date   Anemia    Anxiety    Centrilobular emphysema (HCC) 03/22/2023   Cirrhosis (HCC)    CKD (chronic kidney disease)    COPD (chronic obstructive pulmonary disease) (HCC)    Depression    Diabetes mellitus, type II (HCC)    GERD (gastroesophageal reflux disease)    Hyperlipidemia    Hypertension    Hypokalemia    Hyponatremia    Hypoxemia 01/26/2024   Neuromuscular disorder (HCC) 2021   Substance abuse (HCC)    Years ago   Ulcer 1980s    PAST SURGICAL HISTORY:   Past Surgical History:  Procedure Laterality Date   BIOPSY  02/10/2023   Procedure: BIOPSY;  Surgeon:  Lanelle Bal, DO;  [facility];  Service: Endoscopy;;   CESAREAN SECTION  1984   CHOLECYSTECTOMY     ESOPHAGEAL BRUSHING  02/10/2023   Procedure: ESOPHAGEAL BRUSHING;  Surgeon: Lanelle Bal, DO;  [facility];  Service: Endoscopy;;   ESOPHAGOGASTRODUODENOSCOPY (EGD) WITH PROPOFOL N/A 02/10/2023   Procedure: ESOPHAGOGASTRODUODENOSCOPY (EGD) WITH PROPOFOL;  Surgeon: Lanelle Bal, DO;  [facility];  Service: Endoscopy;  Laterality: N/A;  8:45 am   FRACTURE SURGERY  2005   ORTHOPEDIC SURGERY     SPINE  SURGERY  2003   TUBAL LIGATION  1993    SOCIAL HISTORY:   Social History   Tobacco Use   Smoking status: Every Day    Current packs/day: 1.00    Average packs/day: 1 pack/day for 40.0 years (40.0 ttl pk-yrs)    Types: Cigarettes    Passive exposure: Current   Smokeless tobacco: Never   Tobacco comments:    Smoking Cessation Classes Offered.  Substance Use Topics   Alcohol use: Yes    Alcohol/week: 10.0 standard drinks of alcohol    Types: 10 Glasses of wine per week    Comment: occassional wine- 2-3 times weekly    FAMILY HISTORY:   Family History  Problem Relation Age of Onset   Diabetes Mother    Hypertension Mother    Alcohol abuse Mother    Cancer Mother    Hypertension Father    Arthritis Father    Heart disease Father    Cancer Daughter    Obesity Daughter    Drug abuse Son    Breast cancer Neg Hx     DRUG ALLERGIES:  No Known Allergies  REVIEW OF SYSTEMS:   ROS As per history of present illness. All pertinent systems were reviewed above. Constitutional, HEENT, cardiovascular, respiratory, GI, GU, musculoskeletal, neuro, psychiatric, endocrine, integumentary and hematologic systems were reviewed and are otherwise negative/unremarkable except for positive findings mentioned above in the HPI.   MEDICATIONS AT HOME:   Prior to Admission medications   Medication Sig Start Date End Date Taking? Authorizing Provider  busPIRone (BUSPAR) 10 MG tablet Take 10 mg by mouth 3 (three) times daily. 05/31/21   [provider]  Continuous Glucose Receiver (FREESTYLE LIBRE 3 READER) DEVI 1 each by Other route See admin instructions. 12/21/23   Dani Gobble, NP  Continuous Glucose Sensor (FREESTYLE LIBRE 3 PLUS SENSOR) MISC Change sensor every 15 days. 09/28/23   Dani Gobble, NP  DULoxetine (CYMBALTA) 60 MG capsule Take 60 mg by mouth daily. 12/08/23   [provider]  fluticasone Aleda Grana) 50 MCG/ACT nasal spray USE 2 SPRAYS IN EACH NOSTRIL  EVERY DAY 01/22/24   Billie Lade, MD  folic acid (FOLVITE) 400 MCG tablet Take 400 mcg by mouth daily.    [provider]  furosemide (LASIX) 40 MG tablet Take 1 tablet (40 mg total) by mouth 2 (two) times daily. 12/13/23 03/12/24  Aida Raider, NP  gabapentin (NEURONTIN) 300 MG capsule Take 1 capsule (300 mg total) by mouth once for 1 dose. 12/21/23 01/16/24  Billie Lade, MD  gabapentin (NEURONTIN) 600 MG tablet Take 1 tablet (600 mg total) by mouth 3 (three) times daily. 12/21/23   Billie Lade, MD  Glucagon, rDNA, (GLUCAGON EMERGENCY) 1 MG KIT Inject 1 mg into the muscle as needed (low blood sugar). 08/02/21   [provider]  insulin isophane & regular human KwikPen (NOVOLIN 70/30  KWIKPEN) (70-30) 100 UNIT/ML KwikPen Inject 15 Units into the skin in the morning and at bedtime. 09/28/23   Dani Gobble, NP  Insulin Pen Needle (BD PEN NEEDLE NANO 2ND GEN) 32G X 4 MM MISC Use to inject insulin and Victoza for 3 injections per day 09/13/23   Dani Gobble, NP  INSULIN SYRINGE .5CC/29G 29G X 1/2" 0.5 ML MISC Use to inject insulin twice daily. 09/13/23   Dani Gobble, NP  metFORMIN (GLUCOPHAGE-XR) 500 MG 24 hr tablet Take 1 tablet (500 mg total) by mouth daily with breakfast. 09/28/23   Dani Gobble, NP  ondansetron (ZOFRAN) 4 MG tablet Take 1 tablet (4 mg total) by mouth every 8 (eight) hours as needed. 12/21/23   Billie Lade, MD  pantoprazole (PROTONIX) 40 MG tablet Take 1 tablet (40 mg total) by mouth 2 (two) times daily. 12/13/23 12/12/24  Aida Raider, NP  Potassium Chloride 40 MEQ/15ML (20%) SOLN Take 7.5 mLs by mouth daily. 12/21/23   Billie Lade, MD  REPATHA 140 MG/ML SOSY INJECT 140MG  UNDER THE SKIN EVERY 14 DAYS 01/17/24   Billie Lade, MD  spironolactone (ALDACTONE) 100 MG tablet Take 1 tablet (100 mg total) by mouth daily. 12/13/23   Aida Raider, NP  STIOLTO RESPIMAT 2.5-2.5 MCG/ACT AERS INHALE 2 PUFFS BY MOUTH ONCE  DAILY 01/18/24   Billie Lade, MD  tirzepatide [facility]) 2.5 MG/0.5ML Pen INJECT 2.5MG  (1 PEN) UNDER THE SKIN EVERY WEEK 01/10/24   Billie Lade, MD  traZODone (DESYREL) 100 MG tablet Take 100 mg by mouth at bedtime. 12/08/23   [provider]  VENTOLIN HFA 108 (90 Base) MCG/ACT inhaler INHALE 2 PUFFS BY MOUTH EVERY 6 HOURS AS NEEDED FOR WHEEZING OR SHORTNESS OF BREATH 12/21/23   Billie Lade, MD      VITAL SIGNS:  Blood pressure (!) 119/50, pulse (!) 115, temperature 98.7 F (37.1 C), temperature source Oral, resp. rate 16, height 5\' 5"  (1.651 m), weight 75.2 kg, SpO2 98%.  PHYSICAL EXAMINATION:  Physical Exam  GENERAL:  63 y.o.-year-old patient lying in the bed with no acute distress.  EYES: Pupils equal, round, reactive to light and accommodation. No scleral icterus. Extraocular muscles intact.  HEENT: Head atraumatic, normocephalic. Oropharynx and nasopharynx clear.  NECK:  Supple, no jugular venous distention. No thyroid enlargement, no tenderness.  LUNGS: Normal breath sounds bilaterally, no wheezing, rales,rhonchi or crepitation. No use of accessory muscles of respiration.  CARDIOVASCULAR: Regular rate and rhythm, S1, S2 normal. No murmurs, rubs, or gallops.  ABDOMEN: Soft, nondistended, nontender. Bowel sounds present. No organomegaly or mass.  EXTREMITIES: No pedal edema, cyanosis, or clubbing.  NEUROLOGIC: Cranial nerves II through XII are intact. Muscle strength 5/5 in all extremities. Sensation intact. Gait not checked.  PSYCHIATRIC: The patient is alert and oriented x 3.  Normal affect and good eye contact. SKIN: No obvious rash, lesion, or ulcer.   LABORATORY PANEL:   CBC Recent Labs  Lab 01/25/24 1635  WBC 16.8*  HGB 9.2*  HCT 27.7*  PLT 134*   ------------------------------------------------------------------------------------------------------------------  Chemistries  Recent Labs  Lab 01/25/24 1635  NA 130*  K 3.6  CL 90*  CO2 24   GLUCOSE 146*  BUN 8  CREATININE 1.03*  CALCIUM 8.1*  AST 63*  ALT 20  ALKPHOS 157*  BILITOT 7.8*   ------------------------------------------------------------------------------------------------------------------  Cardiac Enzymes No results for input(s): "TROPONINI" in the last 168 hours. ------------------------------------------------------------------------------------------------------------------  RADIOLOGY:  DG Chest [facility]  1 View Result Date: 01/25/2024 CLINICAL DATA:  Hypoxia. EXAM: PORTABLE CHEST 1 VIEW COMPARISON:  06/17/2015, lung bases from abdominal CT earlier today FINDINGS: The cardiomediastinal contours are normal. The lungs are clear. Pulmonary vasculature is normal. No consolidation, pleural effusion, or pneumothorax. No acute osseous abnormalities are seen. IMPRESSION: No acute chest findings. Electronically Signed   By: Narda Rutherford M.D.   On: 01/25/2024 23:34   CT ABDOMEN PELVIS W CONTRAST Result Date: 01/25/2024 CLINICAL DATA:  Abdominal pain, acute, nonlocalized EXAM: CT ABDOMEN AND PELVIS WITH CONTRAST TECHNIQUE: Multidetector CT imaging of the abdomen and pelvis was performed using the standard protocol following bolus administration of intravenous contrast. RADIATION DOSE REDUCTION: This exam was performed according to the departmental dose-optimization program which includes automated exposure control, adjustment of the mA and/or kV according to patient size and/or use of iterative reconstruction technique. CONTRAST:  OMNIPAQUE IOHEXOL 300 MG/ML  SOLN COMPARISON:  Ultrasound abdomen 12/15/2023 FINDINGS: Lower chest: No acute abnormality. Hepatobiliary: Nodular hepatic contour. No focal liver abnormality. Status post cholecystectomy. No biliary dilatation. Pancreas: No focal lesion. Normal pancreatic contour. No surrounding inflammatory changes. No main pancreatic ductal dilatation. Spleen: Normal in size without focal abnormality. Adrenals/Urinary Tract: No  adrenal nodule bilaterally. Bilateral kidneys enhance symmetrically. Fluid dense lesion likely represents a simple renal cyst. Simple renal cysts, in the absence of clinically indicated signs/symptoms, require no independent follow-up. No hydronephrosis. No hydroureter. The urinary bladder is unremarkable. Stomach/Bowel: Question thinning and slight hazy contour of the greater curvature (2:26, 4:22). No evidence of bowel wall thickening or dilatation. Appendix appears normal. Vascular/Lymphatic: The portal, splenic, superior mesenteric veins are patent. No abdominal aorta or iliac aneurysm. Severe atherosclerotic plaque of the aorta and its branches. No abdominal, pelvic, or inguinal lymphadenopathy. Reproductive: Uterus and bilateral adnexa are unremarkable. Other: Moderate volume simple free fluid ascites. No intraperitoneal free gas. No organized fluid collection. Musculoskeletal: Diffuse subsegmental atelectasis. No suspicious lytic or blastic osseous lesions. No acute displaced fracture. IMPRESSION: 1. Question thinning and slight hazy contour of the greater curvature. Underlying ulceration is not excluded. No bowel perforation. Consider correlation with direct visualization. 2. Cirrhosis. No focal liver lesions identified. Please note that liver protocol enhanced MR and CT are the most sensitive tests for the screening detection of hepatocellular carcinoma in the high risk setting of cirrhosis. 3. Moderate volume simple free fluid ascites. 4.  Aortic Atherosclerosis (ICD10-I70.0). Electronically Signed   By: Tish Frederickson M.D.   On: 01/25/2024 22:24      IMPRESSION AND PLAN:  Assessment and Plan: * Generalized weakness - This is multifactorial.  Could be partly related to her anemia, hyponatremia as well as liver cirrhosis.. - Management as below. - PT consult to be obtained.  Symptomatic anemia - Will obtain anemia workup. - She will need a GI consultation especially given suspected greater  curvature gastric ulcer. - We will place her on IV PPI therapy with Protonix.  Abnormal CT of the abdomen - It showed liver cirrhosis as well as suspected gastric greater curvature ulcer. - We will place her on IV PPI therapy as mentioned above. - GI consult will be obtained. - Dr. Tasia Catchings was notified and is aware about the patient. - We will follow LFTs. - The patient may have liver cell failure given her coagulopathy.  Leukocytosis - We will check a urinalysis. - No current clear infectious etiology. - She was given a dose of IV Zosyn. - I will hold off on further antibiotic therapy since her  abdominal pelvic CT scan showed no infectious etiology. - It could be related to stress demargination.  Hypoxemia - We will add a D-dimer and if elevated she may need chest CTA though I really doubt PE given her coagulopathy. - This could be related to obstructive sleep apnea as well.  GERD (gastroesophageal reflux disease) - She will be on IV PPI therapy as mentioned above.  Chronic obstructive pulmonary disease (COPD) (HCC) - We will continue her inhalers.  Dyslipidemia - She is on Repatha.  Type 2 diabetes mellitus with peripheral neuropathy (HCC) - The will be placed on supplement coverage with NovoLog. - We will continue basal coverage. - We will hold off metformin. - We will continue Neurontin.  Anxiety and depression - We will continue BuSpar, trazodone and Cymbalta    DVT prophylaxis: SCDs.  Medical prophylaxis is held off given suspected gastric ulcer. Advanced Care Planning:  Code Status: full code. Family Communication:  The plan of care was discussed in details with the patient (and family). I answered all questions. The patient agreed to proceed with the above mentioned plan. Further management will depend upon hospital course. Disposition Plan: Back to previous home environment Consults called: GI consult All the records are reviewed and case discussed with ED  provider.  Status is: Inpatient   At the time of the admission, it appears that the appropriate admission status for this patient is inpatient.  This is judged to be reasonable and necessary in order to provide the required intensity of service to ensure the patient's safety given the presenting symptoms, physical exam findings and initial radiographic and laboratory data in the context of comorbid conditions.  The patient requires inpatient status due to high intensity of service, high risk of further deterioration and high frequency of surveillance required.  I certify that at the time of admission, it is my clinical judgment that the patient will require inpatient hospital care extending more than 2 midnights.                            Dispo: The patient is from: Home              Anticipated d/c is to: Home              Patient currently is not medically stable to d/c.              Difficult to place patient: No  Hannah Beat M.D on 01/26/2024 at 5:29 AM  [facility]   From 7 PM-7 AM, contact night-coverage [email or web address]  CC: Primary care physician; Billie Lade, MD

## 2024-01-26 NOTE — Plan of Care (Signed)
  Problem: Acute Rehab PT Goals(only PT should resolve) Goal: Pt Will Go Supine/Side To Sit 01/26/2024 1403 by Darlyn Chamber, Student-PT Flowsheets (Taken 01/26/2024 1401) Pt will go Supine/Side to Sit:  with contact guard assist  with minimal assist 01/26/2024 1401 by Darlyn Chamber, Student-PT Outcome: Progressing Flowsheets (Taken 01/26/2024 1401) Pt will go Supine/Side to Sit:  with contact guard assist  with minimal assist   Problem: Acute Rehab PT Goals(only PT should resolve) Goal: Patient Will Transfer Sit To/From Stand 01/26/2024 1403 by Darlyn Chamber, Student-PT Flowsheets (Taken 01/26/2024 1401) Patient will transfer sit to/from stand: with minimal assist 01/26/2024 1401 by Darlyn Chamber, Student-PT Outcome: Progressing Flowsheets (Taken 01/26/2024 1401) Patient will transfer sit to/from stand: with minimal assist   Problem: Acute Rehab PT Goals(only PT should resolve) Goal: Pt Will Transfer Bed To Chair/Chair To Bed 01/26/2024 1403 by Darlyn Chamber, Student-PT Flowsheets (Taken 01/26/2024 1401) Pt will Transfer Bed to Chair/Chair to Bed: with min assist 01/26/2024 1401 by Darlyn Chamber, Student-PT Outcome: Progressing Flowsheets (Taken 01/26/2024 1401) Pt will Transfer Bed to Chair/Chair to Bed: with min assist   Problem: Acute Rehab PT Goals(only PT should resolve) Goal: Pt Will Ambulate 01/26/2024 1403 by Darlyn Chamber, Student-PT Flowsheets (Taken 01/26/2024 1403) Pt will Ambulate:  50 feet  with minimal assist  with rolling walker   Thea Silversmith Leeanne Butters SPT

## 2024-01-26 NOTE — TOC Initial Note (Addendum)
Transition of Care [facility]) - Initial/Assessment Note    Patient Details  Name: Michelle Michael MRN: [id number] Date of Birth: 22-Aug-1961  Transition of [facility]) CM/SW Contact:    Villa Herb, LCSWA Phone Number: 01/26/2024, 9:21 AM  Clinical Narrative:                 Pt is high risk for readmission. CSW spoke with pt at bedside to complete assessment. Pt lives with a roommate. Pt is normally independent in completing her ADLs. Pt has not been driving. Pt has a cane and walker to use if needed. CSW spoke with pt about SNF if recommended by PT as they are in room to see pt. Pt states that she would be interested in the referral being sent out to local facilities for review. CSW to await PT recommendations and send out SNF referral if needed.   CSW updated that PT recommends SNF. CSW to complete referral and send out for review. TOC to follow.   Expected Discharge Plan: Skilled Nursing Facility Barriers to Discharge: Continued Medical Work up   Patient Goals and CMS Choice Patient states their goals for this hospitalization and ongoing recovery are:: get stronger CMS Medicare.gov Compare Post Acute Care list provided to:: Patient Choice offered to / list presented to : Patient [place] ownership interest in [facility].provided to:: Patient    Expected Discharge Plan and Services In-house Referral: Clinical Social Work Discharge Planning Services: CM Consult Post Acute Care Choice: Skilled Nursing Facility Living arrangements for the past 2 months: Single Family Home                                      Prior Living Arrangements/Services Living arrangements for the past 2 months: Single Family Home Lives with:: Roommate Patient language and need for interpreter reviewed:: Yes Do you feel safe going back to the place where you live?: Yes      Need for Family Participation in Patient Care: Yes (Comment) Care giver support system in place?: Yes  (comment) Current home services: DME Criminal Activity/Legal Involvement Pertinent to Current Situation/Hospitalization: No - Comment as needed  Activities of Daily Living      Permission Sought/Granted                  Emotional Assessment Appearance:: Appears stated age Attitude/Demeanor/Rapport: Engaged Affect (typically observed): Accepting Orientation: : Oriented to Self, Oriented to Place, Oriented to  Time, Oriented to Situation Alcohol / Substance Use: Not Applicable Psych Involvement: No (comment)  Admission diagnosis:  Generalized weakness [R53.1] Patient Active Problem List   Diagnosis Date Noted   Symptomatic anemia 01/26/2024   Leukocytosis 01/26/2024   Abnormal CT of the abdomen 01/26/2024   Anxiety and depression 01/26/2024   Type 2 diabetes mellitus with peripheral neuropathy (HCC) 01/26/2024   Dyslipidemia 01/26/2024   Chronic obstructive pulmonary disease (COPD) (HCC) 01/26/2024   GERD (gastroesophageal reflux disease) 01/26/2024   Hypoxemia 01/26/2024   Generalized weakness 01/25/2024   Neuropathy 03/30/2023   Centrilobular emphysema (HCC) 03/22/2023   Hearing impaired 12/15/2022   Fatigue 12/15/2022   Annual physical exam 07/15/2022   Locking finger joint 07/15/2022   Leg cramping 07/15/2022   Bilateral lower extremity edema 07/15/2022   Cirrhosis (HCC) 04/14/2022   Diabetes mellitus, type II (HCC) 04/14/2022   Dyslipidemia, goal LDL below 70 04/14/2022   Hypertension 04/14/2022   GAD (  generalized anxiety disorder) 04/14/2022   Current every day smoker 04/14/2022   ETOH abuse 06/21/2015   Major depressive disorder, recurrent, severe without psychotic features (HCC)    Alcohol use disorder, severe, dependence (HCC)    Alcoholism (HCC) 06/17/2015   Hypokalemia 06/17/2015   Substance induced mood disorder (HCC) 06/17/2015   PCP:  Billie Lade, MD Pharmacy:   [facility] - [place], [place] - [address] [place] [address] Phone: [phone] Fax: [phone]  [facility] [address], [place] - [address] [place] [address] [place] [address] [place] [address] Phone: [phone] Fax: [phone]     Social Drivers of Health (SDOH) Social History: SDOH Screenings   Food Insecurity: No Food Insecurity (01/12/2024)  Housing: Low Risk  (01/12/2024)  Transportation Needs: Unmet Transportation Needs (01/12/2024)  Utilities: Not At Risk (01/16/2024)  Alcohol Screen: Medium Risk (01/12/2024)  Depression (PHQ2-9): Low Risk  (01/25/2024)  Financial Resource Strain: Low Risk  (01/12/2024)  Physical Activity: Insufficiently Active (01/12/2024)  Social Connections: Socially Isolated (01/12/2024)  Stress: Stress Concern Present (01/12/2024)  Tobacco Use: High Risk (01/25/2024)  Health Literacy: Adequate Health Literacy (01/16/2024)   SDOH Interventions:     Readmission Risk Interventions    01/26/2024    9:20 AM  Readmission Risk Prevention Plan  Transportation Screening Complete  HRI or Home Care Consult Complete  Social Work Consult for Recovery Care Planning/Counseling Complete  Palliative Care Screening Not Applicable  Medication Review Oceanographer) Complete

## 2024-01-26 NOTE — Progress Notes (Signed)
Patient tolerated right sided paracentesis procedure well today and 2.5 Liters of dark yellow ascites removed with labs collected and sent for processing. Patient verbalized understanding of post procedure instructions and transported via stretcher back to inpatient bed assignment at this time with no acute distress noted.

## 2024-01-26 NOTE — Assessment & Plan Note (Signed)
-   The will be placed on supplement coverage with NovoLog. - We will continue basal coverage. - We will hold off metformin. - We will continue Neurontin.

## 2024-01-26 NOTE — Assessment & Plan Note (Signed)
-   We will add a D-dimer and if elevated she may need chest CTA though I really doubt PE given her coagulopathy. - This could be related to obstructive sleep apnea as well.

## 2024-01-26 NOTE — Consult Note (Signed)
Gastroenterology Consult   Referring Provider: No ref. provider found Primary Care Physician:  Michelle Lade, MD Primary Gastroenterologist: Hennie Duos. Marletta Lor, DO   Patient ID: [name]; [id number]; 12-30-1960   Admit date: 01/25/2024  LOS: 1 day   Date of Consultation: 01/26/2024  Reason for Consultation:  jaundice, cirrhosis, concern for gastric ulcer  History of Present Illness   Michelle Michael is a 63 y.o. year old female with history of CAD, anxiety/depression, diabetes on insulin, GERD, HTN, anemia, alcoholic cirrhosis who presented to the ED at the recommendation of PCP for jaundice and weakness.  GI consulted for further evaluation/management of her cirrhosis as well as concern for gastritis/gastric ulceration on CT imaging.   ED Course: Vitals - HR 126-119, RR 22, BP 90-100/50-65, temp 10F Labs - WBC 16.8, Hgb 9.8 (11.3 in Oct 2024), INR 1.6, AST 63, ALT 21, Tbili 7.8 (direct 2.9, indirect 4.9), sodium 130, albumin 2.3 EKG - sinus tachycardia with occasional PVCs CT A/P with contrast IMPRESSION: 1. Question thinning and slight hazy contour of the greater curvature. Underlying ulceration is not excluded. No bowel perforation. Consider correlation with direct visualization. 2. Cirrhosis. No focal liver lesions identified. Please note that liver protocol enhanced MR and CT are the most sensitive tests for the screening detection of hepatocellular carcinoma in the high risk setting of cirrhosis. 3. Moderate volume simple free fluid ascites CXR obtained - unremarkable.  Folic acid 3.7 (low), B12 >1000, ferritin 885,iron 101, acute hep panel negative  Consult:  For the last 2 weeks she has been having more fatigue as well as dyspnea on exertion.  She states at home she had not noticed any yellowing of her skin or eyes however she was told by her PCP as well as a friend that accompanied her that she did appear yellow.  She continues to smoke about 3/4 pack/day of  cigarettes and drink 3-4 glasses of wine daily.  Denies any other drug use.  She states she has been having about 1 bowel movement daily on average, stools have been really dark jellylike, unsure if black (unsure of melena).  Denies any BRBPR.  She states she has been compliant with her Lasix as well as her Aldactone.  She has been taking Lasix 40 mg twice daily and Aldactone 100 mg daily however does feel like her urine output has been decreased over the last several days.  Has had some increased peripheral edema as well as fluid around her belly.  She has been compliant with her PPI as well.  Denies any recent sick contacts.  Had a sip of water to wet her mouth during my visit with her, she had a mild coughing fit with this and within 60-90 seconds she began to vomit.  Emesis was yellow in color.  No coffee-ground emesis.  Last office visit 10/19/23.  Intermittent alcohol use.  Did report lots of cramping in her muscles and forearms.  Had recently started to magnesium supplementation.  Reported ongoing intermittent tremors.  Denied any pruritus or jaundice.  Reflux fairly well-controlled.  Previously having increased peripheral edema however improved after increase of Lasix to 40 mg twice daily.  We had discussed the importance of complete alcohol cessation.  Advise daily potassium and magnesium supplementation.  Advise she was due for hepatoma screening in December 2024.  EGD 02/10/23: -No evidence of varices -grade B reflux esophagitis -non severe candidiasis esophagitis, KOH prep negative. -gastritis s/p biopsy (reactive gastropathy and findings consistent with portal  hypertensive gastropathy), negative H. pylori. -normal duodenum -use PPI BID -repeat EGD in 2 years   Past Medical History:  Diagnosis Date   Anemia    Anxiety    Centrilobular emphysema (HCC) 03/22/2023   Cirrhosis (HCC)    CKD (chronic kidney disease)    COPD (chronic obstructive pulmonary disease) (HCC)    Depression     Diabetes mellitus, type II (HCC)    GERD (gastroesophageal reflux disease)    Hyperlipidemia    Hypertension    Hypokalemia    Hyponatremia    Hypoxemia 01/26/2024   Neuromuscular disorder (HCC) 2021   Substance abuse (HCC)    Years ago   Ulcer 1980s    Past Surgical History:  Procedure Laterality Date   BIOPSY  02/10/2023   Procedure: BIOPSY;  Surgeon: Lanelle Bal, DO;  Location: [facility];  Service: Endoscopy;;   CESAREAN SECTION  1984   CHOLECYSTECTOMY     ESOPHAGEAL BRUSHING  02/10/2023   Procedure: ESOPHAGEAL BRUSHING;  Surgeon: Lanelle Bal, DO;  Location: [facility];  Service: Endoscopy;;   ESOPHAGOGASTRODUODENOSCOPY (EGD) WITH PROPOFOL N/A 02/10/2023   Procedure: ESOPHAGOGASTRODUODENOSCOPY (EGD) WITH PROPOFOL;  Surgeon: Lanelle Bal, DO;  Location: [facility];  Service: Endoscopy;  Laterality: N/A;  8:45 am   FRACTURE SURGERY  2005   ORTHOPEDIC SURGERY     SPINE SURGERY  2003   TUBAL LIGATION  1993    Prior to Admission medications   Medication Sig Start Date End Date Taking? Authorizing Provider  busPIRone (BUSPAR) 10 MG tablet Take 10 mg by mouth 3 (three) times daily. 05/31/21  Yes [provider]  DULoxetine (CYMBALTA) 60 MG capsule Take 60 mg by mouth daily. 12/08/23  Yes [provider]  fluticasone (FLONASE) 50 MCG/ACT nasal spray USE 2 SPRAYS IN EACH NOSTRIL EVERY DAY 01/22/24  Yes Michelle Lade, MD  furosemide (LASIX) 40 MG tablet Take 1 tablet (40 mg total) by mouth 2 (two) times daily. 12/13/23 03/12/24 Yes Lavada Langsam, Frederik Schmidt, NP  gabapentin (NEURONTIN) 300 MG capsule Take 1 capsule (300 mg total) by mouth once for 1 dose. Patient taking differently: Take 300 mg by mouth once. 12/21/23 01/26/24 Yes Michelle Lade, MD  gabapentin (NEURONTIN) 600 MG tablet Take 1 tablet (600 mg total) by mouth 3 (three) times daily. 12/21/23  Yes Michelle Lade, MD  metFORMIN (GLUCOPHAGE-XR) 500 MG 24 hr tablet Take 1 tablet (500 mg  total) by mouth daily with breakfast. 09/28/23  Yes Reardon, Freddi Starr, NP  ondansetron (ZOFRAN) 4 MG tablet Take 1 tablet (4 mg total) by mouth every 8 (eight) hours as needed. 12/21/23  Yes Michelle Lade, MD  pantoprazole (PROTONIX) 40 MG tablet Take 1 tablet (40 mg total) by mouth 2 (two) times daily. 12/13/23 12/12/24 Yes Reign Bartnick, Frederik Schmidt, NP  Potassium Chloride 40 MEQ/15ML (20%) SOLN Take 7.5 mLs by mouth daily. 12/21/23  Yes Michelle Lade, MD  REPATHA 140 MG/ML SOSY INJECT 140MG  UNDER THE SKIN EVERY 14 DAYS 01/17/24  Yes Michelle Lade, MD  spironolactone (ALDACTONE) 100 MG tablet Take 1 tablet (100 mg total) by mouth daily. 12/13/23  Yes Aida Raider, NP  STIOLTO RESPIMAT 2.5-2.5 MCG/ACT AERS INHALE 2 PUFFS BY MOUTH ONCE DAILY 01/18/24  Yes Michelle Lade, MD  tirzepatide [facility]) 2.5 MG/0.5ML Pen INJECT 2.5MG  (1 PEN) UNDER THE SKIN EVERY WEEK 01/10/24  Yes Michelle Lade, MD  traZODone (DESYREL) 100 MG tablet Take 100  mg by mouth at bedtime. 12/08/23  Yes [provider]  VENTOLIN HFA 108 (90 Base) MCG/ACT inhaler INHALE 2 PUFFS BY MOUTH EVERY 6 HOURS AS NEEDED FOR WHEEZING OR SHORTNESS OF BREATH 12/21/23  Yes Michelle Lade, MD  Continuous Glucose Receiver (FREESTYLE LIBRE 3 READER) DEVI 1 each by Other route See admin instructions. 12/21/23   Dani Gobble, NP  Continuous Glucose Sensor (FREESTYLE LIBRE 3 PLUS SENSOR) MISC Change sensor every 15 days. 09/28/23   Dani Gobble, NP  folic acid (FOLVITE) 400 MCG tablet Take 400 mcg by mouth daily. Patient not taking: Reported on 01/26/2024    [provider]  Glucagon, rDNA, (GLUCAGON EMERGENCY) 1 MG KIT Inject 1 mg into the muscle as needed (low blood sugar). 08/02/21   [provider]  insulin isophane & regular human KwikPen (NOVOLIN 70/30 KWIKPEN) (70-30) 100 UNIT/ML KwikPen Inject 15 Units into the skin in the morning and at bedtime. 09/28/23   Dani Gobble, NP  Insulin Pen Needle  (BD PEN NEEDLE NANO 2ND GEN) 32G X 4 MM MISC Use to inject insulin and Victoza for 3 injections per day 09/13/23   Dani Gobble, NP  INSULIN SYRINGE .5CC/29G 29G X 1/2" 0.5 ML MISC Use to inject insulin twice daily. 09/13/23   Dani Gobble, NP    Current Facility-Administered Medications  Medication Dose Route Frequency Provider Last Rate Last Admin   0.9 %  sodium chloride infusion   Intravenous Continuous Mansy, Vernetta Honey, MD   Stopped at 01/26/24 0729   0.9 % NaCl with KCl 20 mEq/ L  infusion   Intravenous Continuous Mansy, Jan A, MD 100 mL/hr at 01/26/24 0730 New Bag at 01/26/24 0730   acetaminophen (TYLENOL) tablet 650 mg  650 mg Oral Q6H PRN Mansy, Jan A, MD       Or   acetaminophen (TYLENOL) suppository 650 mg  650 mg Rectal Q6H PRN Mansy, Jan A, MD       albuterol (PROVENTIL) (2.5 MG/3ML) 0.083% nebulizer solution 2.5 mg  2.5 mg Nebulization Q4H PRN Mansy, Jan A, MD       arformoterol [facility]) nebulizer solution 15 mcg  15 mcg Nebulization BID Mansy, Jan A, MD   15 mcg at 01/26/24 0909   And   umeclidinium bromide (INCRUSE ELLIPTA) 62.5 MCG/ACT 1 puff  1 puff Inhalation Daily Mansy, Jan A, MD       busPIRone (BUSPAR) tablet 10 mg  10 mg Oral TID Mansy, Jan A, MD       cefTRIAXone (ROCEPHIN) 1 g in sodium chloride 0.9 % 100 mL IVPB  1 g Intravenous Q24H Brooke Bonito L, NP       DULoxetine (CYMBALTA) DR capsule 60 mg  60 mg Oral Daily Mansy, Jan A, MD   60 mg at 01/26/24 0909   fluticasone (FLONASE) 50 MCG/ACT nasal spray 2 spray  2 spray Each Nare Daily Mansy, Jan A, MD   2 spray at 01/26/24 0912   furosemide (LASIX) tablet 40 mg  40 mg Oral BID Mansy, Jan A, MD   40 mg at 01/26/24 0730   gabapentin (NEURONTIN) capsule 600 mg  600 mg Oral TID Mansy, Jan A, MD   600 mg at 01/26/24 7829   insulin aspart protamine- aspart (NOVOLOG MIX 70/30) injection 15 Units  15 Units Subcutaneous BID WC Mansy, Jan A, MD       magnesium hydroxide (MILK OF MAGNESIA) suspension 30 mL  30 mL Oral  Daily PRN Mansy, Jan A, MD       ondansetron [facility]) tablet 4 mg  4 mg Oral Q6H PRN Mansy, Jan A, MD       Or   ondansetron [facility]) injection 4 mg  4 mg Intravenous Q6H PRN Mansy, Jan A, MD       potassium chloride (KLOR-CON) packet 20 mEq  20 mEq Oral Daily Mansy, Jan A, MD       spironolactone (ALDACTONE) tablet 100 mg  100 mg Oral Daily Mansy, Jan A, MD   100 mg at 01/26/24 1914   traZODone (DESYREL) tablet 100 mg  100 mg Oral QHS Mansy, Jan A, MD       traZODone (DESYREL) tablet 25 mg  25 mg Oral QHS PRN Mansy, Vernetta Honey, MD       Current Outpatient Medications  Medication Sig Dispense Refill   busPIRone (BUSPAR) 10 MG tablet Take 10 mg by mouth 3 (three) times daily.     DULoxetine (CYMBALTA) 60 MG capsule Take 60 mg by mouth daily.     fluticasone (FLONASE) 50 MCG/ACT nasal spray USE 2 SPRAYS IN EACH NOSTRIL EVERY DAY 16 g 11   furosemide (LASIX) 40 MG tablet Take 1 tablet (40 mg total) by mouth 2 (two) times daily. 180 tablet 1   gabapentin (NEURONTIN) 300 MG capsule Take 1 capsule (300 mg total) by mouth once for 1 dose. (Patient taking differently: Take 300 mg by mouth once.) 90 capsule 0   gabapentin (NEURONTIN) 600 MG tablet Take 1 tablet (600 mg total) by mouth 3 (three) times daily. 180 tablet 0   metFORMIN (GLUCOPHAGE-XR) 500 MG 24 hr tablet Take 1 tablet (500 mg total) by mouth daily with breakfast. 90 tablet 3   ondansetron (ZOFRAN) 4 MG tablet Take 1 tablet (4 mg total) by mouth every 8 (eight) hours as needed. 20 tablet 0   pantoprazole (PROTONIX) 40 MG tablet Take 1 tablet (40 mg total) by mouth 2 (two) times daily. 180 tablet 3   Potassium Chloride 40 MEQ/15ML (20%) SOLN Take 7.5 mLs by mouth daily. 473 mL 0   REPATHA 140 MG/ML SOSY INJECT 140MG  UNDER THE SKIN EVERY 14 DAYS 2 mL 11   spironolactone (ALDACTONE) 100 MG tablet Take 1 tablet (100 mg total) by mouth daily. 90 tablet 3   STIOLTO RESPIMAT 2.5-2.5 MCG/ACT AERS INHALE 2 PUFFS BY MOUTH ONCE DAILY 4 g 0   tirzepatide  (MOUNJARO) 2.5 MG/0.5ML Pen INJECT 2.5MG  (1 PEN) UNDER THE SKIN EVERY WEEK 0.5 mL 11   traZODone (DESYREL) 100 MG tablet Take 100 mg by mouth at bedtime.     VENTOLIN HFA 108 (90 Base) MCG/ACT inhaler INHALE 2 PUFFS BY MOUTH EVERY 6 HOURS AS NEEDED FOR WHEEZING OR SHORTNESS OF BREATH 18 g 0   Continuous Glucose Receiver (FREESTYLE LIBRE 3 READER) DEVI 1 each by Other route See admin instructions. 1 each 0   Continuous Glucose Sensor (FREESTYLE LIBRE 3 PLUS SENSOR) MISC Change sensor every 15 days. 6 each 3   folic acid (FOLVITE) 400 MCG tablet Take 400 mcg by mouth daily. (Patient not taking: Reported on 01/26/2024)     Glucagon, rDNA, (GLUCAGON EMERGENCY) 1 MG KIT Inject 1 mg into the muscle as needed (low blood sugar).     insulin isophane & regular human KwikPen (NOVOLIN 70/30 KWIKPEN) (70-30) 100 UNIT/ML KwikPen Inject 15 Units into the skin in the morning and at bedtime. 25 mL 3   Insulin Pen Needle (BD  PEN NEEDLE NANO 2ND GEN) 32G X 4 MM MISC Use to inject insulin and Victoza for 3 injections per day 1000 each 6   INSULIN SYRINGE .5CC/29G 29G X 1/2" 0.5 ML MISC Use to inject insulin twice daily. 100 each 3    Allergies as of 01/25/2024   (No Known Allergies)    Family History  Problem Relation Age of Onset   Diabetes Mother    Hypertension Mother    Alcohol abuse Mother    Cancer Mother    Hypertension Father    Arthritis Father    Heart disease Father    Cancer Daughter    Obesity Daughter    Drug abuse Son    Breast cancer Neg Hx     Social History   Socioeconomic History   Marital status: Divorced    Spouse name: Not on file   Number of children: 2   Years of education: 12   Highest education level: Associate degree: occupational, Scientist, product/process development, or vocational program  Occupational History   Not on file  Tobacco Use   Smoking status: Every Day    Current packs/day: 1.00    Average packs/day: 1 pack/day for 40.0 years (40.0 ttl pk-yrs)    Types: Cigarettes    Passive  exposure: Current   Smokeless tobacco: Never   Tobacco comments:    Smoking Cessation Classes Offered.  Vaping Use   Vaping status: Former  Substance and Sexual Activity   Alcohol use: Yes    Alcohol/week: 10.0 standard drinks of alcohol    Types: 10 Glasses of wine per week    Comment: occassional wine- 2-3 times weekly   Drug use: Never   Sexual activity: Not Currently    Partners: Male    Birth control/protection: Abstinence, Post-menopausal, None  Other Topics Concern   Not on file  Social History Narrative   Lives with her room mate    Social Drivers of Health   Financial Resource Strain: Low Risk  (01/12/2024)   Overall Financial Resource Strain (CARDIA)    Difficulty of Paying Living Expenses: Not very hard  Food Insecurity: No Food Insecurity (01/12/2024)   Hunger Vital Sign    Worried About Running Out of Food in the Last Year: Never true    Ran Out of Food in the Last Year: Never true  Transportation Needs: Unmet Transportation Needs (01/12/2024)   PRAPARE - Transportation    Lack of Transportation (Medical): Yes    Lack of Transportation (Non-Medical): Yes  Physical Activity: Insufficiently Active (01/12/2024)   Exercise Vital Sign    Days of Exercise per Week: 1 day    Minutes of Exercise per Session: 10 min  Stress: Stress Concern Present (01/12/2024)   [employer] of Occupational Health - Occupational Stress Questionnaire    Feeling of Stress : Rather much  Social Connections: Socially Isolated (01/12/2024)   Social Connection and Isolation Panel [NHANES]    Frequency of Communication with Friends and Family: Twice a week    Frequency of Social Gatherings with Friends and Family: Never    Attends Religious Services: Never    Database administrator or Organizations: No    Attends Engineer, structural: 1 to 4 times per year    Marital Status: Divorced  Intimate Partner Violence: Not At Risk (01/16/2024)   Humiliation, Afraid, Rape, and Kick  questionnaire    Fear of Current or Ex-Partner: No    Emotionally Abused: No    Physically Abused: No  Sexually Abused: No     Review of Systems   Gen: + fatigue, weakness. Denies any fever, chills, loss of appetite, change in weight or weight loss CV: + Peripheral edema.  Denies chest pain, heart palpitations, syncope Resp: Denies shortness of breath with rest, cough, wheezing, coughing up blood, and pleurisy. GI: see HPI GU : Denies urinary burning, blood in urine, urinary frequency, and urinary incontinence. MS: Denies joint pain, limitation of movement, swelling, cramps, and atrophy.  Derm: + jaundice. Denies rash, itching, dry skin, hives. Psych: + Anxiety/depression.  Denies memory loss, hallucinations, and confusion. Heme: Denies bruising or bleeding Neuro:  Denies any headaches, dizziness, paresthesias, shaking  Physical Exam   Vital Signs in last 24 hours: Temp:  [98.7 F (37.1 C)-99 F (37.2 C)] 98.8 F (37.1 C) (01/31 0534) Pulse Rate:  [111-126] 116 (01/31 0900) Resp:  [12-22] 17 (01/31 0900) BP: (90-119)/(36-67) 111/60 (01/31 0900) SpO2:  [92 %-100 %] 94 % (01/31 0900) Weight:  [75.1 kg-75.2 kg] 75.2 kg (01/30 1528) Last BM Date : 01/25/24  General: Drowsy, jaundiced, NAD Head:  Normocephalic and atraumatic. Eyes:  + Icterus.   Conjunctiva pink. Ears:  Normal auditory acuity. Mouth:  No deformity or lesions, dentition normal. Neck:  Supple; no masses Abdomen:  Soft.  Mild distention, fluid-filled.  Mild TTP to upper abdomen, without guarding, and without rebound.   Rectal:  Extremities:  Without clubbing or edema. Neurologic: drowsy and oriented x4. Baseline tremor noted. No overt asterixis.  Skin:  Intact without significant lesions or rashes. Psych:  Alert and cooperative. Normal mood and affect.  Intake/Output from previous day: 01/30 0701 - 01/31 0700 In: 1040 [IV Piggyback:1040] Out: -  Intake/Output this shift: No intake/output data  recorded.  Labs/Studies   Recent Labs Recent Labs    01/25/24 1635 01/26/24 0338  WBC 16.8* 11.5*  HGB 9.2* 8.1*  HCT 27.7* 23.0*  PLT 134* 114*   BMET Recent Labs    01/25/24 1635 01/26/24 0338  NA 130* 130*  K 3.6 3.4*  CL 90* 94*  CO2 24 27  GLUCOSE 146* 133*  BUN 8 10  CREATININE 1.03* 1.08*  CALCIUM 8.1* 7.3*   LFT Recent Labs    01/25/24 1635  PROT 6.6  ALBUMIN 2.3*  AST 63*  ALT 20  ALKPHOS 157*  BILITOT 7.8*  BILIDIR 2.9*  IBILI 4.9*   PT/INR Recent Labs    01/25/24 1635  LABPROT 18.8*  INR 1.6*   Hepatitis Panel Recent Labs    01/26/24 0338  HEPBSAG NON REACTIVE  HCVAB PENDING  HEPAIGM PENDING  HEPBIGM PENDING   C-Diff No results for input(s): "CDIFFTOX" in the last 72 hours.  Radiology/Studies DG Chest Port 1 View Result Date: 01/25/2024 CLINICAL DATA:  Hypoxia. EXAM: PORTABLE CHEST 1 VIEW COMPARISON:  06/17/2015, lung bases from abdominal CT earlier today FINDINGS: The cardiomediastinal contours are normal. The lungs are clear. Pulmonary vasculature is normal. No consolidation, pleural effusion, or pneumothorax. No acute osseous abnormalities are seen. IMPRESSION: No acute chest findings. Electronically Signed   By: Narda Rutherford M.D.   On: 01/25/2024 23:34   CT ABDOMEN PELVIS W CONTRAST Result Date: 01/25/2024 CLINICAL DATA:  Abdominal pain, acute, nonlocalized EXAM: CT ABDOMEN AND PELVIS WITH CONTRAST TECHNIQUE: Multidetector CT imaging of the abdomen and pelvis was performed using the standard protocol following bolus administration of intravenous contrast. RADIATION DOSE REDUCTION: This exam was performed according to the departmental dose-optimization program which includes automated exposure control, adjustment of  the mA and/or kV according to patient size and/or use of iterative reconstruction technique. CONTRAST:  OMNIPAQUE IOHEXOL 300 MG/ML  SOLN COMPARISON:  Ultrasound abdomen 12/15/2023 FINDINGS: Lower chest: No acute  abnormality. Hepatobiliary: Nodular hepatic contour. No focal liver abnormality. Status post cholecystectomy. No biliary dilatation. Pancreas: No focal lesion. Normal pancreatic contour. No surrounding inflammatory changes. No main pancreatic ductal dilatation. Spleen: Normal in size without focal abnormality. Adrenals/Urinary Tract: No adrenal nodule bilaterally. Bilateral kidneys enhance symmetrically. Fluid dense lesion likely represents a simple renal cyst. Simple renal cysts, in the absence of clinically indicated signs/symptoms, require no independent follow-up. No hydronephrosis. No hydroureter. The urinary bladder is unremarkable. Stomach/Bowel: Question thinning and slight hazy contour of the greater curvature (2:26, 4:22). No evidence of bowel wall thickening or dilatation. Appendix appears normal. Vascular/Lymphatic: The portal, splenic, superior mesenteric veins are patent. No abdominal aorta or iliac aneurysm. Severe atherosclerotic plaque of the aorta and its branches. No abdominal, pelvic, or inguinal lymphadenopathy. Reproductive: Uterus and bilateral adnexa are unremarkable. Other: Moderate volume simple free fluid ascites. No intraperitoneal free gas. No organized fluid collection. Musculoskeletal: Diffuse subsegmental atelectasis. No suspicious lytic or blastic osseous lesions. No acute displaced fracture. IMPRESSION: 1. Question thinning and slight hazy contour of the greater curvature. Underlying ulceration is not excluded. No bowel perforation. Consider correlation with direct visualization. 2. Cirrhosis. No focal liver lesions identified. Please note that liver protocol enhanced MR and CT are the most sensitive tests for the screening detection of hepatocellular carcinoma in the high risk setting of cirrhosis. 3. Moderate volume simple free fluid ascites. 4.  Aortic Atherosclerosis (ICD10-I70.0). Electronically Signed   By: Tish Frederickson M.D.   On: 01/25/2024 22:24   Assessment   Crystie Yanko Muldrew is a 63 y.o. year old female with history of CAD, anxiety/depression, diabetes on insulin, GERD, HTN, anemia, alcoholic cirrhosis who presented to the ED at the recommendation of PCP for jaundice and weakness.  GI consulted for further evaluation/management of her cirrhosis as well as concern for gastritis/gastric ulceration on CT imaging.  Abnormal stomach on CT: CT this admission given her complaint of mild abdominal pain.  No focal liver lesion identified.  Had slight hazing and thinning of the contour of the greater curvature.  No evidence of bowel wall thickening.  No signs of perforation.  Large volume of simple free fluid ascites.  Given the abnormality of the greater curvature of the stomach, GI asked to evaluate for ulceration.  Consideration for EGD given these findings as well as her anemia, discussing timing with Dr. Marletta Lor.  Anemia: Presented with hemoglobin 9.2, this is down from 11.6 in October 2024 and 13.8 in May 2024.  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  She has been maintained on PPI twice daily.  She denies any BRBPR or overt melena however has explained stools as being more jellylike over the last several weeks.  This is concerning for possible upper GI bleed.  Will continue PPI twice daily, will need EGD for further evaluation of possible upper GI bleed -discussing timing with Dr. Marletta Lor given she is experiencing some tachycardia.  Will continue to trend H/H.  Cirrhosis, alcoholic hepatitis: Presented with weakness as well as jaundice and scleral icterus that was noted by PCP. Admits to 3-4 glasses of wine daily.  Reports compliance with her diuretics at home.  No history of hepatic encephalopathy however does  have some baseline intermittent tremor.  Not currently with any hepatic encephalopathy although is drowsy given not much sleep last night  in the ED. EGD February 2024 without evidence of varices.  MELD 3.0: 26. Not a transplant candidate given ongoing Etoh use.  Initially started on Zosyn for concern for intra-abdominal infection given leukocytosis on presentation, although she is nontender on exam today she does appear to be more distended secondary to ascites therefore we will switch her antibiotic course from IV Zosyn to ceftriaxone which is recommended for SBP prophylaxis.  Leukocytosis could be reactive in regards to the inflammatory process however in the setting of cirrhosis need to rule out SBP.  MD score 34.2 based on PT control of 13. Unfortunately given her ongoing alcohol use I would not recommend treatment with steroids as she has shown no intent with cessation.  Recommend continuing to treat supportively.  Hypokalemia and hypomagnesemia: Likely related to poor p.o. intake and alcohol use.  Also likely secondary to diuretics.  Recommend replacement per protocol.  Plan / Recommendations   PPI IV BID Ceftriaxone 1g daily for 5 days for SBP prophylaxis given anemia/concern for UGI bleed [place] ascites, diagnostic para to assess for SBP given leukocytosis Antiemetics as needed.  Trend LFTs and H/H.  Alcohol cessation Likely EGD - discuss timing with Dr. Marletta Lor. Likely over the weekend after electrolytes replaced. Tentatively plan for tomorrow.  Folic acid supplementation Supportive measures Monitor for HE Replace electrolytes NPO midnight     01/26/2024, 9:35 AM  Brooke Bonito, MSN, FNP-BC, AGACNP-BC [facility]

## 2024-01-26 NOTE — Assessment & Plan Note (Signed)
-   We will check a urinalysis. - No current clear infectious etiology. - She was given a dose of IV Zosyn. - I will hold off on further antibiotic therapy since her abdominal pelvic CT scan showed no infectious etiology. - It could be related to stress demargination.

## 2024-01-26 NOTE — Assessment & Plan Note (Addendum)
-   She is on Repatha.

## 2024-01-26 NOTE — Assessment & Plan Note (Signed)
-   We will continue her inhalers. 

## 2024-01-26 NOTE — Assessment & Plan Note (Signed)
-   This is multifactorial.  Could be partly related to her anemia, hyponatremia as well as liver cirrhosis.. - Management as below. - PT consult to be obtained.

## 2024-01-26 NOTE — Assessment & Plan Note (Addendum)
-   We will continue BuSpar, trazodone and Cymbalta

## 2024-01-26 NOTE — Assessment & Plan Note (Addendum)
-   It showed liver cirrhosis as well as suspected gastric greater curvature ulcer. - We will place her on IV PPI therapy as mentioned above. - GI consult will be obtained. - Dr. Tasia Catchings was notified and is aware about the patient. - We will follow LFTs. - The patient may have liver cell failure given her coagulopathy.

## 2024-01-26 NOTE — H&P (View-Only) (Signed)
Gastroenterology Consult   Referring Provider: No ref. provider found Primary Care Physician:  Billie Lade, MD Primary Gastroenterologist: Hennie Duos. Marletta Lor, DO   Patient ID: Michelle Michael; [id number]; 12-30-1960   Admit date: 01/25/2024  LOS: 1 day   Date of Consultation: 01/26/2024  Reason for Consultation:  jaundice, cirrhosis, concern for gastric ulcer  History of Present Illness   Michelle Michael is a 63 y.o. year old female with history of CAD, anxiety/depression, diabetes on insulin, GERD, HTN, anemia, alcoholic cirrhosis who presented to the ED at the recommendation of PCP for jaundice and weakness.  GI consulted for further evaluation/management of her cirrhosis as well as concern for gastritis/gastric ulceration on CT imaging.   ED Course: Vitals - HR 126-119, RR 22, BP 90-100/50-65, temp 10F Labs - WBC 16.8, Hgb 9.8 (11.3 in Oct 2024), INR 1.6, AST 63, ALT 21, Tbili 7.8 (direct 2.9, indirect 4.9), sodium 130, albumin 2.3 EKG - sinus tachycardia with occasional PVCs CT A/P with contrast IMPRESSION: 1. Question thinning and slight hazy contour of the greater curvature. Underlying ulceration is not excluded. No bowel perforation. Consider correlation with direct visualization. 2. Cirrhosis. No focal liver lesions identified. Please note that liver protocol enhanced MR and CT are the most sensitive tests for the screening detection of hepatocellular carcinoma in the high risk setting of cirrhosis. 3. Moderate volume simple free fluid ascites CXR obtained - unremarkable.  Folic acid 3.7 (low), B12 >1000, ferritin 885,iron 101, acute hep panel negative  Consult:  For the last 2 weeks she has been having more fatigue as well as dyspnea on exertion.  She states at home she had not noticed any yellowing of her skin or eyes however she was told by her PCP as well as a friend that accompanied her that she did appear yellow.  She continues to smoke about 3/4 pack/day of  cigarettes and drink 3-4 glasses of wine daily.  Denies any other drug use.  She states she has been having about 1 bowel movement daily on average, stools have been really dark jellylike, unsure if black (unsure of melena).  Denies any BRBPR.  She states she has been compliant with her Lasix as well as her Aldactone.  She has been taking Lasix 40 mg twice daily and Aldactone 100 mg daily however does feel like her urine output has been decreased over the last several days.  Has had some increased peripheral edema as well as fluid around her belly.  She has been compliant with her PPI as well.  Denies any recent sick contacts.  Had a sip of water to wet her mouth during my visit with her, she had a mild coughing fit with this and within 60-90 seconds she began to vomit.  Emesis was yellow in color.  No coffee-ground emesis.  Last office visit 10/19/23.  Intermittent alcohol use.  Did report lots of cramping in her muscles and forearms.  Had recently started to magnesium supplementation.  Reported ongoing intermittent tremors.  Denied any pruritus or jaundice.  Reflux fairly well-controlled.  Previously having increased peripheral edema however improved after increase of Lasix to 40 mg twice daily.  We had discussed the importance of complete alcohol cessation.  Advise daily potassium and magnesium supplementation.  Advise she was due for hepatoma screening in December 2024.  EGD 02/10/23: -No evidence of varices -grade B reflux esophagitis -non severe candidiasis esophagitis, KOH prep negative. -gastritis s/p biopsy (reactive gastropathy and findings consistent with portal  hypertensive gastropathy), negative H. pylori. -normal duodenum -use PPI BID -repeat EGD in 2 years   Past Medical History:  Diagnosis Date   Anemia    Anxiety    Centrilobular emphysema (HCC) 03/22/2023   Cirrhosis (HCC)    CKD (chronic kidney disease)    COPD (chronic obstructive pulmonary disease) (HCC)    Depression     Diabetes mellitus, type II (HCC)    GERD (gastroesophageal reflux disease)    Hyperlipidemia    Hypertension    Hypokalemia    Hyponatremia    Hypoxemia 01/26/2024   Neuromuscular disorder (HCC) 2021   Substance abuse (HCC)    Years ago   Ulcer 1980s    Past Surgical History:  Procedure Laterality Date   BIOPSY  02/10/2023   Procedure: BIOPSY;  Surgeon: Lanelle Bal, DO;  Location: [facility];  Service: Endoscopy;;   CESAREAN SECTION  1984   CHOLECYSTECTOMY     ESOPHAGEAL BRUSHING  02/10/2023   Procedure: ESOPHAGEAL BRUSHING;  Surgeon: Lanelle Bal, DO;  Location: [facility];  Service: Endoscopy;;   ESOPHAGOGASTRODUODENOSCOPY (EGD) WITH PROPOFOL N/A 02/10/2023   Procedure: ESOPHAGOGASTRODUODENOSCOPY (EGD) WITH PROPOFOL;  Surgeon: Lanelle Bal, DO;  Location: [facility];  Service: Endoscopy;  Laterality: N/A;  8:45 am   FRACTURE SURGERY  2005   ORTHOPEDIC SURGERY     SPINE SURGERY  2003   TUBAL LIGATION  1993    Prior to Admission medications   Medication Sig Start Date End Date Taking? Authorizing Provider  busPIRone (BUSPAR) 10 MG tablet Take 10 mg by mouth 3 (three) times daily. 05/31/21  Yes [provider]  DULoxetine (CYMBALTA) 60 MG capsule Take 60 mg by mouth daily. 12/08/23  Yes [provider]  fluticasone (FLONASE) 50 MCG/ACT nasal spray USE 2 SPRAYS IN EACH NOSTRIL EVERY DAY 01/22/24  Yes Billie Lade, MD  furosemide (LASIX) 40 MG tablet Take 1 tablet (40 mg total) by mouth 2 (two) times daily. 12/13/23 03/12/24 Yes Lavada Langsam, Frederik Schmidt, NP  gabapentin (NEURONTIN) 300 MG capsule Take 1 capsule (300 mg total) by mouth once for 1 dose. Patient taking differently: Take 300 mg by mouth once. 12/21/23 01/26/24 Yes Billie Lade, MD  gabapentin (NEURONTIN) 600 MG tablet Take 1 tablet (600 mg total) by mouth 3 (three) times daily. 12/21/23  Yes Billie Lade, MD  metFORMIN (GLUCOPHAGE-XR) 500 MG 24 hr tablet Take 1 tablet (500 mg  total) by mouth daily with breakfast. 09/28/23  Yes Reardon, Freddi Starr, NP  ondansetron (ZOFRAN) 4 MG tablet Take 1 tablet (4 mg total) by mouth every 8 (eight) hours as needed. 12/21/23  Yes Billie Lade, MD  pantoprazole (PROTONIX) 40 MG tablet Take 1 tablet (40 mg total) by mouth 2 (two) times daily. 12/13/23 12/12/24 Yes Reign Bartnick, Frederik Schmidt, NP  Potassium Chloride 40 MEQ/15ML (20%) SOLN Take 7.5 mLs by mouth daily. 12/21/23  Yes Billie Lade, MD  REPATHA 140 MG/ML SOSY INJECT 140MG  UNDER THE SKIN EVERY 14 DAYS 01/17/24  Yes Billie Lade, MD  spironolactone (ALDACTONE) 100 MG tablet Take 1 tablet (100 mg total) by mouth daily. 12/13/23  Yes Aida Raider, NP  STIOLTO RESPIMAT 2.5-2.5 MCG/ACT AERS INHALE 2 PUFFS BY MOUTH ONCE DAILY 01/18/24  Yes Billie Lade, MD  tirzepatide [facility]) 2.5 MG/0.5ML Pen INJECT 2.5MG  (1 PEN) UNDER THE SKIN EVERY WEEK 01/10/24  Yes Billie Lade, MD  traZODone (DESYREL) 100 MG tablet Take 100  mg by mouth at bedtime. 12/08/23  Yes [provider]  VENTOLIN HFA 108 (90 Base) MCG/ACT inhaler INHALE 2 PUFFS BY MOUTH EVERY 6 HOURS AS NEEDED FOR WHEEZING OR SHORTNESS OF BREATH 12/21/23  Yes Billie Lade, MD  Continuous Glucose Receiver (FREESTYLE LIBRE 3 READER) DEVI 1 each by Other route See admin instructions. 12/21/23   Dani Gobble, NP  Continuous Glucose Sensor (FREESTYLE LIBRE 3 PLUS SENSOR) MISC Change sensor every 15 days. 09/28/23   Dani Gobble, NP  folic acid (FOLVITE) 400 MCG tablet Take 400 mcg by mouth daily. Patient not taking: Reported on 01/26/2024    [provider]  Glucagon, rDNA, (GLUCAGON EMERGENCY) 1 MG KIT Inject 1 mg into the muscle as needed (low blood sugar). 08/02/21   [provider]  insulin isophane & regular human KwikPen (NOVOLIN 70/30 KWIKPEN) (70-30) 100 UNIT/ML KwikPen Inject 15 Units into the skin in the morning and at bedtime. 09/28/23   Dani Gobble, NP  Insulin Pen Needle  (BD PEN NEEDLE NANO 2ND GEN) 32G X 4 MM MISC Use to inject insulin and Victoza for 3 injections per day 09/13/23   Dani Gobble, NP  INSULIN SYRINGE .5CC/29G 29G X 1/2" 0.5 ML MISC Use to inject insulin twice daily. 09/13/23   Dani Gobble, NP    Current Facility-Administered Medications  Medication Dose Route Frequency Provider Last Rate Last Admin   0.9 %  sodium chloride infusion   Intravenous Continuous Mansy, Vernetta Honey, MD   Stopped at 01/26/24 0729   0.9 % NaCl with KCl 20 mEq/ L  infusion   Intravenous Continuous Mansy, Jan A, MD 100 mL/hr at 01/26/24 0730 New Bag at 01/26/24 0730   acetaminophen (TYLENOL) tablet 650 mg  650 mg Oral Q6H PRN Mansy, Jan A, MD       Or   acetaminophen (TYLENOL) suppository 650 mg  650 mg Rectal Q6H PRN Mansy, Jan A, MD       albuterol (PROVENTIL) (2.5 MG/3ML) 0.083% nebulizer solution 2.5 mg  2.5 mg Nebulization Q4H PRN Mansy, Jan A, MD       arformoterol [facility]) nebulizer solution 15 mcg  15 mcg Nebulization BID Mansy, Jan A, MD   15 mcg at 01/26/24 0909   And   umeclidinium bromide (INCRUSE ELLIPTA) 62.5 MCG/ACT 1 puff  1 puff Inhalation Daily Mansy, Jan A, MD       busPIRone (BUSPAR) tablet 10 mg  10 mg Oral TID Mansy, Jan A, MD       cefTRIAXone (ROCEPHIN) 1 g in sodium chloride 0.9 % 100 mL IVPB  1 g Intravenous Q24H Brooke Bonito L, NP       DULoxetine (CYMBALTA) DR capsule 60 mg  60 mg Oral Daily Mansy, Jan A, MD   60 mg at 01/26/24 0909   fluticasone (FLONASE) 50 MCG/ACT nasal spray 2 spray  2 spray Each Nare Daily Mansy, Jan A, MD   2 spray at 01/26/24 0912   furosemide (LASIX) tablet 40 mg  40 mg Oral BID Mansy, Jan A, MD   40 mg at 01/26/24 0730   gabapentin (NEURONTIN) capsule 600 mg  600 mg Oral TID Mansy, Jan A, MD   600 mg at 01/26/24 7829   insulin aspart protamine- aspart (NOVOLOG MIX 70/30) injection 15 Units  15 Units Subcutaneous BID WC Mansy, Jan A, MD       magnesium hydroxide (MILK OF MAGNESIA) suspension 30 mL  30 mL Oral  Daily PRN Mansy, Jan A, MD       ondansetron [facility]) tablet 4 mg  4 mg Oral Q6H PRN Mansy, Jan A, MD       Or   ondansetron [facility]) injection 4 mg  4 mg Intravenous Q6H PRN Mansy, Jan A, MD       potassium chloride (KLOR-CON) packet 20 mEq  20 mEq Oral Daily Mansy, Jan A, MD       spironolactone (ALDACTONE) tablet 100 mg  100 mg Oral Daily Mansy, Jan A, MD   100 mg at 01/26/24 1914   traZODone (DESYREL) tablet 100 mg  100 mg Oral QHS Mansy, Jan A, MD       traZODone (DESYREL) tablet 25 mg  25 mg Oral QHS PRN Mansy, Vernetta Honey, MD       Current Outpatient Medications  Medication Sig Dispense Refill   busPIRone (BUSPAR) 10 MG tablet Take 10 mg by mouth 3 (three) times daily.     DULoxetine (CYMBALTA) 60 MG capsule Take 60 mg by mouth daily.     fluticasone (FLONASE) 50 MCG/ACT nasal spray USE 2 SPRAYS IN EACH NOSTRIL EVERY DAY 16 g 11   furosemide (LASIX) 40 MG tablet Take 1 tablet (40 mg total) by mouth 2 (two) times daily. 180 tablet 1   gabapentin (NEURONTIN) 300 MG capsule Take 1 capsule (300 mg total) by mouth once for 1 dose. (Patient taking differently: Take 300 mg by mouth once.) 90 capsule 0   gabapentin (NEURONTIN) 600 MG tablet Take 1 tablet (600 mg total) by mouth 3 (three) times daily. 180 tablet 0   metFORMIN (GLUCOPHAGE-XR) 500 MG 24 hr tablet Take 1 tablet (500 mg total) by mouth daily with breakfast. 90 tablet 3   ondansetron (ZOFRAN) 4 MG tablet Take 1 tablet (4 mg total) by mouth every 8 (eight) hours as needed. 20 tablet 0   pantoprazole (PROTONIX) 40 MG tablet Take 1 tablet (40 mg total) by mouth 2 (two) times daily. 180 tablet 3   Potassium Chloride 40 MEQ/15ML (20%) SOLN Take 7.5 mLs by mouth daily. 473 mL 0   REPATHA 140 MG/ML SOSY INJECT 140MG  UNDER THE SKIN EVERY 14 DAYS 2 mL 11   spironolactone (ALDACTONE) 100 MG tablet Take 1 tablet (100 mg total) by mouth daily. 90 tablet 3   STIOLTO RESPIMAT 2.5-2.5 MCG/ACT AERS INHALE 2 PUFFS BY MOUTH ONCE DAILY 4 g 0   tirzepatide  (MOUNJARO) 2.5 MG/0.5ML Pen INJECT 2.5MG  (1 PEN) UNDER THE SKIN EVERY WEEK 0.5 mL 11   traZODone (DESYREL) 100 MG tablet Take 100 mg by mouth at bedtime.     VENTOLIN HFA 108 (90 Base) MCG/ACT inhaler INHALE 2 PUFFS BY MOUTH EVERY 6 HOURS AS NEEDED FOR WHEEZING OR SHORTNESS OF BREATH 18 g 0   Continuous Glucose Receiver (FREESTYLE LIBRE 3 READER) DEVI 1 each by Other route See admin instructions. 1 each 0   Continuous Glucose Sensor (FREESTYLE LIBRE 3 PLUS SENSOR) MISC Change sensor every 15 days. 6 each 3   folic acid (FOLVITE) 400 MCG tablet Take 400 mcg by mouth daily. (Patient not taking: Reported on 01/26/2024)     Glucagon, rDNA, (GLUCAGON EMERGENCY) 1 MG KIT Inject 1 mg into the muscle as needed (low blood sugar).     insulin isophane & regular human KwikPen (NOVOLIN 70/30 KWIKPEN) (70-30) 100 UNIT/ML KwikPen Inject 15 Units into the skin in the morning and at bedtime. 25 mL 3   Insulin Pen Needle (BD  PEN NEEDLE NANO 2ND GEN) 32G X 4 MM MISC Use to inject insulin and Victoza for 3 injections per day 1000 each 6   INSULIN SYRINGE .5CC/29G 29G X 1/2" 0.5 ML MISC Use to inject insulin twice daily. 100 each 3    Allergies as of 01/25/2024   (No Known Allergies)    Family History  Problem Relation Age of Onset   Diabetes Mother    Hypertension Mother    Alcohol abuse Mother    Cancer Mother    Hypertension Father    Arthritis Father    Heart disease Father    Cancer Daughter    Obesity Daughter    Drug abuse Son    Breast cancer Neg Hx     Social History   Socioeconomic History   Marital status: Divorced    Spouse name: Not on file   Number of children: 2   Years of education: 12   Highest education level: Associate degree: occupational, Scientist, product/process development, or vocational program  Occupational History   Not on file  Tobacco Use   Smoking status: Every Day    Current packs/day: 1.00    Average packs/day: 1 pack/day for 40.0 years (40.0 ttl pk-yrs)    Types: Cigarettes    Passive  exposure: Current   Smokeless tobacco: Never   Tobacco comments:    Smoking Cessation Classes Offered.  Vaping Use   Vaping status: Former  Substance and Sexual Activity   Alcohol use: Yes    Alcohol/week: 10.0 standard drinks of alcohol    Types: 10 Glasses of wine per week    Comment: occassional wine- 2-3 times weekly   Drug use: Never   Sexual activity: Not Currently    Partners: Male    Birth control/protection: Abstinence, Post-menopausal, None  Other Topics Concern   Not on file  Social History Narrative   Lives with her room mate    Social Drivers of Health   Financial Resource Strain: Low Risk  (01/12/2024)   Overall Financial Resource Strain (CARDIA)    Difficulty of Paying Living Expenses: Not very hard  Food Insecurity: No Food Insecurity (01/12/2024)   Hunger Vital Sign    Worried About Running Out of Food in the Last Year: Never true    Ran Out of Food in the Last Year: Never true  Transportation Needs: Unmet Transportation Needs (01/12/2024)   PRAPARE - Transportation    Lack of Transportation (Medical): Yes    Lack of Transportation (Non-Medical): Yes  Physical Activity: Insufficiently Active (01/12/2024)   Exercise Vital Sign    Days of Exercise per Week: 1 day    Minutes of Exercise per Session: 10 min  Stress: Stress Concern Present (01/12/2024)   [employer] of Occupational Health - Occupational Stress Questionnaire    Feeling of Stress : Rather much  Social Connections: Socially Isolated (01/12/2024)   Social Connection and Isolation Panel [NHANES]    Frequency of Communication with Friends and Family: Twice a week    Frequency of Social Gatherings with Friends and Family: Never    Attends Religious Services: Never    Database administrator or Organizations: No    Attends Engineer, structural: 1 to 4 times per year    Marital Status: Divorced  Intimate Partner Violence: Not At Risk (01/16/2024)   Humiliation, Afraid, Rape, and Kick  questionnaire    Fear of Current or Ex-Partner: No    Emotionally Abused: No    Physically Abused: No  Sexually Abused: No     Review of Systems   Gen: + fatigue, weakness. Denies any fever, chills, loss of appetite, change in weight or weight loss CV: + Peripheral edema.  Denies chest pain, heart palpitations, syncope Resp: Denies shortness of breath with rest, cough, wheezing, coughing up blood, and pleurisy. GI: see HPI GU : Denies urinary burning, blood in urine, urinary frequency, and urinary incontinence. MS: Denies joint pain, limitation of movement, swelling, cramps, and atrophy.  Derm: + jaundice. Denies rash, itching, dry skin, hives. Psych: + Anxiety/depression.  Denies memory loss, hallucinations, and confusion. Heme: Denies bruising or bleeding Neuro:  Denies any headaches, dizziness, paresthesias, shaking  Physical Exam   Vital Signs in last 24 hours: Temp:  [98.7 F (37.1 C)-99 F (37.2 C)] 98.8 F (37.1 C) (01/31 0534) Pulse Rate:  [111-126] 116 (01/31 0900) Resp:  [12-22] 17 (01/31 0900) BP: (90-119)/(36-67) 111/60 (01/31 0900) SpO2:  [92 %-100 %] 94 % (01/31 0900) Weight:  [75.1 kg-75.2 kg] 75.2 kg (01/30 1528) Last BM Date : 01/25/24  General: Drowsy, jaundiced, NAD Head:  Normocephalic and atraumatic. Eyes:  + Icterus.   Conjunctiva pink. Ears:  Normal auditory acuity. Mouth:  No deformity or lesions, dentition normal. Neck:  Supple; no masses Abdomen:  Soft.  Mild distention, fluid-filled.  Mild TTP to upper abdomen, without guarding, and without rebound.   Rectal:  Extremities:  Without clubbing or edema. Neurologic: drowsy and oriented x4. Baseline tremor noted. No overt asterixis.  Skin:  Intact without significant lesions or rashes. Psych:  Alert and cooperative. Normal mood and affect.  Intake/Output from previous day: 01/30 0701 - 01/31 0700 In: 1040 [IV Piggyback:1040] Out: -  Intake/Output this shift: No intake/output data  recorded.  Labs/Studies   Recent Labs Recent Labs    01/25/24 1635 01/26/24 0338  WBC 16.8* 11.5*  HGB 9.2* 8.1*  HCT 27.7* 23.0*  PLT 134* 114*   BMET Recent Labs    01/25/24 1635 01/26/24 0338  NA 130* 130*  K 3.6 3.4*  CL 90* 94*  CO2 24 27  GLUCOSE 146* 133*  BUN 8 10  CREATININE 1.03* 1.08*  CALCIUM 8.1* 7.3*   LFT Recent Labs    01/25/24 1635  PROT 6.6  ALBUMIN 2.3*  AST 63*  ALT 20  ALKPHOS 157*  BILITOT 7.8*  BILIDIR 2.9*  IBILI 4.9*   PT/INR Recent Labs    01/25/24 1635  LABPROT 18.8*  INR 1.6*   Hepatitis Panel Recent Labs    01/26/24 0338  HEPBSAG NON REACTIVE  HCVAB PENDING  HEPAIGM PENDING  HEPBIGM PENDING   C-Diff No results for input(s): "CDIFFTOX" in the last 72 hours.  Radiology/Studies DG Chest Port 1 View Result Date: 01/25/2024 CLINICAL DATA:  Hypoxia. EXAM: PORTABLE CHEST 1 VIEW COMPARISON:  06/17/2015, lung bases from abdominal CT earlier today FINDINGS: The cardiomediastinal contours are normal. The lungs are clear. Pulmonary vasculature is normal. No consolidation, pleural effusion, or pneumothorax. No acute osseous abnormalities are seen. IMPRESSION: No acute chest findings. Electronically Signed   By: Narda Rutherford M.D.   On: 01/25/2024 23:34   CT ABDOMEN PELVIS W CONTRAST Result Date: 01/25/2024 CLINICAL DATA:  Abdominal pain, acute, nonlocalized EXAM: CT ABDOMEN AND PELVIS WITH CONTRAST TECHNIQUE: Multidetector CT imaging of the abdomen and pelvis was performed using the standard protocol following bolus administration of intravenous contrast. RADIATION DOSE REDUCTION: This exam was performed according to the departmental dose-optimization program which includes automated exposure control, adjustment of  the mA and/or kV according to patient size and/or use of iterative reconstruction technique. CONTRAST:  OMNIPAQUE IOHEXOL 300 MG/ML  SOLN COMPARISON:  Ultrasound abdomen 12/15/2023 FINDINGS: Lower chest: No acute  abnormality. Hepatobiliary: Nodular hepatic contour. No focal liver abnormality. Status post cholecystectomy. No biliary dilatation. Pancreas: No focal lesion. Normal pancreatic contour. No surrounding inflammatory changes. No main pancreatic ductal dilatation. Spleen: Normal in size without focal abnormality. Adrenals/Urinary Tract: No adrenal nodule bilaterally. Bilateral kidneys enhance symmetrically. Fluid dense lesion likely represents a simple renal cyst. Simple renal cysts, in the absence of clinically indicated signs/symptoms, require no independent follow-up. No hydronephrosis. No hydroureter. The urinary bladder is unremarkable. Stomach/Bowel: Question thinning and slight hazy contour of the greater curvature (2:26, 4:22). No evidence of bowel wall thickening or dilatation. Appendix appears normal. Vascular/Lymphatic: The portal, splenic, superior mesenteric veins are patent. No abdominal aorta or iliac aneurysm. Severe atherosclerotic plaque of the aorta and its branches. No abdominal, pelvic, or inguinal lymphadenopathy. Reproductive: Uterus and bilateral adnexa are unremarkable. Other: Moderate volume simple free fluid ascites. No intraperitoneal free gas. No organized fluid collection. Musculoskeletal: Diffuse subsegmental atelectasis. No suspicious lytic or blastic osseous lesions. No acute displaced fracture. IMPRESSION: 1. Question thinning and slight hazy contour of the greater curvature. Underlying ulceration is not excluded. No bowel perforation. Consider correlation with direct visualization. 2. Cirrhosis. No focal liver lesions identified. Please note that liver protocol enhanced MR and CT are the most sensitive tests for the screening detection of hepatocellular carcinoma in the high risk setting of cirrhosis. 3. Moderate volume simple free fluid ascites. 4.  Aortic Atherosclerosis (ICD10-I70.0). Electronically Signed   By: Tish Frederickson M.D.   On: 01/25/2024 22:24   Assessment   Michelle Michael is a 63 y.o. year old female with history of CAD, anxiety/depression, diabetes on insulin, GERD, HTN, anemia, alcoholic cirrhosis who presented to the ED at the recommendation of PCP for jaundice and weakness.  GI consulted for further evaluation/management of her cirrhosis as well as concern for gastritis/gastric ulceration on CT imaging.  Abnormal stomach on CT: CT this admission given her complaint of mild abdominal pain.  No focal liver lesion identified.  Had slight hazing and thinning of the contour of the greater curvature.  No evidence of bowel wall thickening.  No signs of perforation.  Large volume of simple free fluid ascites.  Given the abnormality of the greater curvature of the stomach, GI asked to evaluate for ulceration.  Consideration for EGD given these findings as well as her anemia, discussing timing with Dr. Marletta Lor.  Anemia: Presented with hemoglobin 9.2, this is down from 11.6 in October 2024 and 13.8 in May 2024.  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  She has been maintained on PPI twice daily.  She denies any BRBPR or overt melena however has explained stools as being more jellylike over the last several weeks.  This is concerning for possible upper GI bleed.  Will continue PPI twice daily, will need EGD for further evaluation of possible upper GI bleed -discussing timing with Dr. Marletta Lor given she is experiencing some tachycardia.  Will continue to trend H/H.  Cirrhosis, alcoholic hepatitis: Presented with weakness as well as jaundice and scleral icterus that was noted by PCP. Admits to 3-4 glasses of wine daily.  Reports compliance with her diuretics at home.  No history of hepatic encephalopathy however does  have some baseline intermittent tremor.  Not currently with any hepatic encephalopathy although is drowsy given not much sleep last night  in the ED. EGD February 2024 without evidence of varices.  MELD 3.0: 26. Not a transplant candidate given ongoing Etoh use.  Initially started on Zosyn for concern for intra-abdominal infection given leukocytosis on presentation, although she is nontender on exam today she does appear to be more distended secondary to ascites therefore we will switch her antibiotic course from IV Zosyn to ceftriaxone which is recommended for SBP prophylaxis.  Leukocytosis could be reactive in regards to the inflammatory process however in the setting of cirrhosis need to rule out SBP.  MD score 34.2 based on PT control of 13. Unfortunately given her ongoing alcohol use I would not recommend treatment with steroids as she has shown no intent with cessation.  Recommend continuing to treat supportively.  Hypokalemia and hypomagnesemia: Likely related to poor p.o. intake and alcohol use.  Also likely secondary to diuretics.  Recommend replacement per protocol.  Plan / Recommendations   PPI IV BID Ceftriaxone 1g daily for 5 days for SBP prophylaxis given anemia/concern for UGI bleed [place] ascites, diagnostic para to assess for SBP given leukocytosis Antiemetics as needed.  Trend LFTs and H/H.  Alcohol cessation Likely EGD - discuss timing with Dr. Marletta Lor. Likely over the weekend after electrolytes replaced. Tentatively plan for tomorrow.  Folic acid supplementation Supportive measures Monitor for HE Replace electrolytes NPO midnight     01/26/2024, 9:35 AM  Brooke Bonito, MSN, FNP-BC, AGACNP-BC [facility]

## 2024-01-26 NOTE — ED Notes (Signed)
Delay in 8am insulin due to med not available at this time

## 2024-01-26 NOTE — ED Notes (Signed)
This RN made MD Mansy aware that the pts BP is decreasing and that the Map is below 65.

## 2024-01-26 NOTE — Progress Notes (Addendum)
PROGRESS NOTE    Michelle Michael  ZOX:[id number] DOB: 1961/05/02 DOA: 01/25/2024 PCP: Billie Lade, MD    Brief Narrative:  63 y.o. female with medical history significant for COPD, type 2 diabetes mellitus, GERD, hypertension, dyslipidemia, depression and anxiety, who presented to the emergency room with acute onset of generalized weakness and chills without reported fever as well as jaundice.  She was seen by Dr. Durwin Nora her primary care physician and given her jaundice was referred to the emergency room.  She denied any chest pain however has been having occasional dyspnea and dry cough.  She admitted to nausea and vomiting earlier today.  She admits to heartburn without significant abdominal pain.   Assessment and Plan:  Generalized weakness - This is multifactorial.  Could be partly related to her anemia, hyponatremia as well as liver cirrhosis vs infection - PT consult   Symptomatic anemia - anemia workup. -transfuse for <7 -replace folic acid  Abnormal CT of the abdomen - It showed liver cirrhosis as well as suspected gastric greater curvature ulcer. - We will place her on IV PPI therapy as mentioned above. - GI consult -- Dr. Tasia Catchings to see - We will follow LFTs. -hold on lasix today while NPO  Dog scratch vs bite to forearm -patient says dog was vaccinated and it was a scratch not bite -be sure patient up to date on tetanus  Hypokalemia -replete in IV as NPO -check Mg  Leukocytosis - continue to await urinalysis. -check blood cultures - No current clear infectious etiology. -IV rocephin for now as SBP prophylaxis  Hypoxemia - unclear etiology -d dimer not done as per H&P -will add on -chest x ray unremarkable -check NP swab  GERD (gastroesophageal reflux disease) -  IV PPI   Chronic obstructive pulmonary disease (COPD) (HCC) - continue her inhalers.  Dyslipidemia - on Repatha.  Type 2 diabetes mellitus with peripheral neuropathy (HCC) - hold home  insulin and use SSI for now while NPO  Anxiety and depression -  continue BuSpar, trazodone and Cymbalta  Alcohol use -CIWA    DVT prophylaxis: SCDs Start: 01/26/24 0446    Code Status: Full Code Family Communication: at bedside  Disposition Plan:  Level of care: Med-Surg Status is: Inpatient Remains inpatient appropriate -- may need higher level of care pending progress    Consultants:  GI   Subjective: Still having some chills  Objective: Vitals:   01/26/24 0937 01/26/24 1000 01/26/24 1027 01/26/24 1030  BP:  123/60  (!) 120/50  Pulse:  (!) 119  (!) 119  Resp:  (!) 26  17  Temp:   98.8 F (37.1 C)   TempSrc:   Temporal   SpO2: 98% 94%  94%  Weight:      Height:        Intake/Output Summary (Last 24 hours) at 01/26/2024 1106 Last data filed at 01/25/2024 2302 Gross per 24 hour  Intake 1040 ml  Output --  Net 1040 ml   Filed Weights   01/25/24 1528  Weight: 75.2 kg    Examination:   General: Appearance:     Overweight female who appears ill     Lungs:     respirations unlabored  Heart:    Tachycardic. Normal rhythm. No murmurs, rubs, or gallops.    MS:   All extremities are intact.    Neurologic:   Awake, alert       Data Reviewed: I have personally reviewed following labs and imaging studies  CBC: Recent Labs  Lab 01/25/24 1635 01/26/24 0338  WBC 16.8* 11.5*  NEUTROABS 13.3*  --   HGB 9.2* 8.1*  HCT 27.7* 23.0*  MCV 110.8* 109.0*  PLT 134* 114*   Basic Metabolic Panel: Recent Labs  Lab 01/25/24 1635 01/26/24 0338  NA 130* 130*  K 3.6 3.4*  CL 90* 94*  CO2 24 27  GLUCOSE 146* 133*  BUN 8 10  CREATININE 1.03* 1.08*  CALCIUM 8.1* 7.3*   GFR: Estimated Creatinine Clearance: 54.8 mL/min (A) (by C-G formula based on SCr of 1.08 mg/dL (H)). Liver Function Tests: Recent Labs  Lab 01/25/24 1635  AST 63*  ALT 20  ALKPHOS 157*  BILITOT 7.8*  PROT 6.6  ALBUMIN 2.3*   Recent Labs  Lab 01/25/24 1635  LIPASE 34    Recent Labs  Lab 01/25/24 1635  AMMONIA 31   Coagulation Profile: Recent Labs  Lab 01/25/24 1635  INR 1.6*   Cardiac Enzymes: No results for input(s): "CKTOTAL", "CKMB", "CKMBINDEX", "TROPONINI" in the last 168 hours. BNP (last 3 results) No results for input(s): "PROBNP" in the last 8760 hours. HbA1C: No results for input(s): "HGBA1C" in the last 72 hours. CBG: No results for input(s): "GLUCAP" in the last 168 hours. Lipid Profile: No results for input(s): "CHOL", "HDL", "LDLCALC", "TRIG", "CHOLHDL", "LDLDIRECT" in the last 72 hours. Thyroid Function Tests: No results for input(s): "TSH", "T4TOTAL", "FREET4", "T3FREE", "THYROIDAB" in the last 72 hours. Anemia Panel: Recent Labs    01/26/24 0338  VITAMINB12 1,006*  FOLATE 3.7*  FERRITIN 885*  TIBC NOT CALCULATED  IRON 101  RETICCTPCT 4.1*   Sepsis Labs: No results for input(s): "PROCALCITON", "LATICACIDVEN" in the last 168 hours.  No results found for this or any previous visit (from the past 240 hours).       Radiology Studies: DG Chest Port 1 View Result Date: 01/25/2024 CLINICAL DATA:  Hypoxia. EXAM: PORTABLE CHEST 1 VIEW COMPARISON:  06/17/2015, lung bases from abdominal CT earlier today FINDINGS: The cardiomediastinal contours are normal. The lungs are clear. Pulmonary vasculature is normal. No consolidation, pleural effusion, or pneumothorax. No acute osseous abnormalities are seen. IMPRESSION: No acute chest findings. Electronically Signed   By: Narda Rutherford M.D.   On: 01/25/2024 23:34   CT ABDOMEN PELVIS W CONTRAST Result Date: 01/25/2024 CLINICAL DATA:  Abdominal pain, acute, nonlocalized EXAM: CT ABDOMEN AND PELVIS WITH CONTRAST TECHNIQUE: Multidetector CT imaging of the abdomen and pelvis was performed using the standard protocol following bolus administration of intravenous contrast. RADIATION DOSE REDUCTION: This exam was performed according to the departmental dose-optimization program which  includes automated exposure control, adjustment of the mA and/or kV according to patient size and/or use of iterative reconstruction technique. CONTRAST:  OMNIPAQUE IOHEXOL 300 MG/ML  SOLN COMPARISON:  Ultrasound abdomen 12/15/2023 FINDINGS: Lower chest: No acute abnormality. Hepatobiliary: Nodular hepatic contour. No focal liver abnormality. Status post cholecystectomy. No biliary dilatation. Pancreas: No focal lesion. Normal pancreatic contour. No surrounding inflammatory changes. No main pancreatic ductal dilatation. Spleen: Normal in size without focal abnormality. Adrenals/Urinary Tract: No adrenal nodule bilaterally. Bilateral kidneys enhance symmetrically. Fluid dense lesion likely represents a simple renal cyst. Simple renal cysts, in the absence of clinically indicated signs/symptoms, require no independent follow-up. No hydronephrosis. No hydroureter. The urinary bladder is unremarkable. Stomach/Bowel: Question thinning and slight hazy contour of the greater curvature (2:26, 4:22). No evidence of bowel wall thickening or dilatation. Appendix appears normal. Vascular/Lymphatic: The portal, splenic, superior mesenteric veins are patent. No  abdominal aorta or iliac aneurysm. Severe atherosclerotic plaque of the aorta and its branches. No abdominal, pelvic, or inguinal lymphadenopathy. Reproductive: Uterus and bilateral adnexa are unremarkable. Other: Moderate volume simple free fluid ascites. No intraperitoneal free gas. No organized fluid collection. Musculoskeletal: Diffuse subsegmental atelectasis. No suspicious lytic or blastic osseous lesions. No acute displaced fracture. IMPRESSION: 1. Question thinning and slight hazy contour of the greater curvature. Underlying ulceration is not excluded. No bowel perforation. Consider correlation with direct visualization. 2. Cirrhosis. No focal liver lesions identified. Please note that liver protocol enhanced MR and CT are the most sensitive tests for the  screening detection of hepatocellular carcinoma in the high risk setting of cirrhosis. 3. Moderate volume simple free fluid ascites. 4.  Aortic Atherosclerosis (ICD10-I70.0). Electronically Signed   By: Tish Frederickson M.D.   On: 01/25/2024 22:24        Scheduled Meds:  arformoterol  15 mcg Nebulization BID   And   umeclidinium bromide  1 puff Inhalation Daily   busPIRone  10 mg Oral TID   DULoxetine  60 mg Oral Daily   fluticasone  2 spray Each Nare Daily   furosemide  40 mg Oral BID   gabapentin  600 mg Oral TID   insulin aspart  0-6 Units Subcutaneous Q4H   [START ON 01/27/2024] insulin aspart protamine- aspart  15 Units Subcutaneous BID WC   potassium chloride  20 mEq Oral Daily   spironolactone  100 mg Oral Daily   traZODone  100 mg Oral QHS   Continuous Infusions:  0.9 % NaCl with KCl 20 mEq / L 100 mL/hr at 01/26/24 0730   cefTRIAXone (ROCEPHIN)  IV       LOS: 1 day    Time spent: 45 minutes spent on chart review, discussion with nursing staff, consultants, updating family and interview/physical exam; more than 50% of that time was spent in counseling and/or coordination of care.    Joseph Art, DO [facility] Available via Epic secure chat 7am-7pm After these hours, please refer to coverage provider listed on [email or web address] 01/26/2024, 11:06 AM

## 2024-01-26 NOTE — Assessment & Plan Note (Addendum)
-   Will obtain anemia workup. - She will need a GI consultation especially given suspected greater curvature gastric ulcer. - We will place her on IV PPI therapy with Protonix.

## 2024-01-26 NOTE — Procedures (Signed)
PROCEDURE SUMMARY:  Successful image-guided paracentesis from the  right upper abdomen.  Yielded 2.5 liters of clear yellow fluid.  No immediate complications.  EBL: zero Patient tolerated well.   Specimen was sent for labs.  Please see imaging section of Epic for full dictation.  Villa Herb PA-C 01/26/2024 3:39 PM

## 2024-01-27 ENCOUNTER — Inpatient Hospital Stay (HOSPITAL_COMMUNITY): Payer: Medicare HMO | Admitting: Anesthesiology

## 2024-01-27 ENCOUNTER — Encounter (HOSPITAL_COMMUNITY): Admission: EM | Disposition: E | Payer: Self-pay | Source: Home / Self Care | Attending: Internal Medicine

## 2024-01-27 DIAGNOSIS — F1090 Alcohol use, unspecified, uncomplicated: Secondary | ICD-10-CM | POA: Diagnosis not present

## 2024-01-27 DIAGNOSIS — K7031 Alcoholic cirrhosis of liver with ascites: Secondary | ICD-10-CM | POA: Diagnosis not present

## 2024-01-27 DIAGNOSIS — K3189 Other diseases of stomach and duodenum: Secondary | ICD-10-CM | POA: Diagnosis not present

## 2024-01-27 DIAGNOSIS — D500 Iron deficiency anemia secondary to blood loss (chronic): Secondary | ICD-10-CM | POA: Diagnosis not present

## 2024-01-27 DIAGNOSIS — K766 Portal hypertension: Secondary | ICD-10-CM | POA: Diagnosis not present

## 2024-01-27 DIAGNOSIS — K259 Gastric ulcer, unspecified as acute or chronic, without hemorrhage or perforation: Secondary | ICD-10-CM | POA: Diagnosis not present

## 2024-01-27 DIAGNOSIS — R935 Abnormal findings on diagnostic imaging of other abdominal regions, including retroperitoneum: Secondary | ICD-10-CM | POA: Diagnosis not present

## 2024-01-27 DIAGNOSIS — K222 Esophageal obstruction: Secondary | ICD-10-CM

## 2024-01-27 DIAGNOSIS — D62 Acute posthemorrhagic anemia: Secondary | ICD-10-CM

## 2024-01-27 DIAGNOSIS — K7011 Alcoholic hepatitis with ascites: Secondary | ICD-10-CM

## 2024-01-27 DIAGNOSIS — R531 Weakness: Secondary | ICD-10-CM | POA: Diagnosis not present

## 2024-01-27 HISTORY — PX: ESOPHAGOGASTRODUODENOSCOPY (EGD) WITH PROPOFOL: SHX5813

## 2024-01-27 LAB — RESPIRATORY PANEL BY PCR

## 2024-01-27 LAB — PREPARE RBC (CROSSMATCH)

## 2024-01-27 LAB — COMPREHENSIVE METABOLIC PANEL
ALT: 20 U/L (ref 0–44)
AST: 43 U/L — ABNORMAL HIGH (ref 15–41)
Albumin: 1.9 g/dL — ABNORMAL LOW (ref 3.5–5.0)
Alkaline Phosphatase: 116 U/L (ref 38–126)
Anion gap: 9 (ref 5–15)
BUN: 16 mg/dL (ref 8–23)
CO2: 25 mmol/L (ref 22–32)
Calcium: 7.3 mg/dL — ABNORMAL LOW (ref 8.9–10.3)
Chloride: 96 mmol/L — ABNORMAL LOW (ref 98–111)
Creatinine, Ser: 1.06 mg/dL — ABNORMAL HIGH (ref 0.44–1.00)
GFR, Estimated: 59 mL/min — ABNORMAL LOW (ref >60)
Glucose, Bld: 144 mg/dL — ABNORMAL HIGH (ref 70–99)
Potassium: 3.8 mmol/L (ref 3.5–5.1)
Sodium: 130 mmol/L — ABNORMAL LOW (ref 135–145)
Total Bilirubin: 7.3 mg/dL — ABNORMAL HIGH (ref 0.0–1.2)
Total Protein: 5.1 g/dL — ABNORMAL LOW (ref 6.5–8.1)

## 2024-01-27 LAB — MAGNESIUM: Magnesium: 2 mg/dL (ref 1.7–2.4)

## 2024-01-27 LAB — GLUCOSE, CAPILLARY
Glucose-Capillary: 129 mg/dL — ABNORMAL HIGH (ref 70–99)
Glucose-Capillary: 138 mg/dL — ABNORMAL HIGH (ref 70–99)
Glucose-Capillary: 142 mg/dL — ABNORMAL HIGH (ref 70–99)
Glucose-Capillary: 148 mg/dL — ABNORMAL HIGH (ref 70–99)
Glucose-Capillary: 178 mg/dL — ABNORMAL HIGH (ref 70–99)

## 2024-01-27 LAB — CBC
HCT: 22 % — ABNORMAL LOW (ref 36.0–46.0)
Hemoglobin: 7.4 g/dL — ABNORMAL LOW (ref 12.0–15.0)
MCH: 37.4 pg — ABNORMAL HIGH (ref 26.0–34.0)
MCHC: 33.6 g/dL (ref 30.0–36.0)
MCV: 111.1 fL — ABNORMAL HIGH (ref 80.0–100.0)
Platelets: 109 10*3/uL — ABNORMAL LOW (ref 150–400)
RBC: 1.98 MIL/uL — ABNORMAL LOW (ref 3.87–5.11)
RDW: 15 % (ref 11.5–15.5)
WBC: 16.5 10*3/uL — ABNORMAL HIGH (ref 4.0–10.5)
nRBC: 0 % (ref 0.0–0.2)

## 2024-01-27 LAB — PROTIME-INR
INR: 1.8 — ABNORMAL HIGH (ref 0.8–1.2)
Prothrombin Time: 21.5 s — ABNORMAL HIGH (ref 11.4–15.2)

## 2024-01-27 LAB — ABO/RH: ABO/RH(D): O POS

## 2024-01-27 SURGERY — ESOPHAGOGASTRODUODENOSCOPY (EGD) WITH PROPOFOL
Anesthesia: General

## 2024-01-27 MED ORDER — LORAZEPAM 2 MG/ML IJ SOLN
1.0000 mg | INTRAMUSCULAR | Status: DC | PRN
Start: 2024-01-27 — End: 2024-01-30

## 2024-01-27 MED ORDER — PROPOFOL 10 MG/ML IV BOLUS
INTRAVENOUS | Status: AC
  Filled 2024-01-27: qty 20

## 2024-01-27 MED ORDER — LORAZEPAM 1 MG PO TABS
1.0000 mg | ORAL_TABLET | ORAL | Status: DC | PRN
Start: 2024-01-27 — End: 2024-01-30
  Administered 2024-01-27 – 2024-01-28 (×2): 1 mg via ORAL
  Filled 2024-01-27 (×2): qty 1

## 2024-01-27 MED ORDER — FOLIC ACID 1 MG PO TABS: 1.0000 mg | ORAL_TABLET | Freq: Every day | ORAL | Status: DC

## 2024-01-27 MED ORDER — THIAMINE HCL 100 MG/ML IJ SOLN: 100.0000 mg | Freq: Every day | INTRAMUSCULAR | Status: DC

## 2024-01-27 MED ORDER — ADULT MULTIVITAMIN W/MINERALS CH: 1.0000 | ORAL_TABLET | Freq: Every day | ORAL | Status: DC

## 2024-01-27 MED ORDER — FUROSEMIDE 10 MG/ML IJ SOLN
20.0000 mg | Freq: Once | INTRAMUSCULAR | Status: AC
  Administered 2024-01-27: 20 mg via INTRAVENOUS
  Filled 2024-01-27: qty 2

## 2024-01-27 MED ORDER — LACTATED RINGERS IV SOLN: INTRAVENOUS | Status: DC | PRN

## 2024-01-27 MED ORDER — SODIUM CHLORIDE 0.9% IV SOLUTION: Freq: Once | INTRAVENOUS | Status: AC

## 2024-01-27 MED ORDER — THIAMINE MONONITRATE 100 MG PO TABS: 100.0000 mg | ORAL_TABLET | Freq: Every day | ORAL | Status: DC

## 2024-01-27 NOTE — Progress Notes (Addendum)
PROGRESS NOTE  Michelle Michael  ZOX:[id number] DOB: June 25, 1961 DOA: 01/25/2024 PCP: Billie Lade, MD    Brief Narrative:  63 y.o. female with medical history significant for COPD, type 2 diabetes mellitus, GERD, hypertension, dyslipidemia, depression and anxiety, who presented to the emergency room with acute onset of generalized weakness and chills without reported fever as well as jaundice.  She was seen by Dr. Durwin Nora her primary care physician and given her jaundice was referred to the emergency room.  She denied any chest pain however has been having occasional dyspnea and dry cough.  She admitted to nausea and vomiting earlier today.  She admits to heartburn without significant abdominal pain.  - Physical therapist recommends SNF rehab  Assessment and Plan:  1)Alcoholic Liver Cirrhosis with Ascites ---- -continue Rocephin for SBP prophylaxis in the cirrhotic patient with GI bleed -Continue Aldactone -No encephalopathy -Patient is not a transplant candidate due to ongoing alcohol use -Ascitic fluid from paracentesis dated 01/26/2024--negative Gram stain and cell count not consistent with infection --AFB culture from ascitic fluid pending -Ascitic fluid bacterial culture pending -Ascitic fluid cytology pending --Unfortunately patient is not ready to quit to quit drinking etoh  2) acute on chronic symptomatic anemia--concerns for melena CT abdomen and pelvis consistent with liver cirrhosis as well as suspected gastric greater curvature ulcer. -Hgb currently down to 7.4 from 9.2 -Will transfuse 1 unit of PRBC on 01/27/24 due to symptomatic anemia -EGD on 01/27/2024 shows Small hiatal hernia. Mild Schatzki ring.                           - Portal hypertensive gastropathy.                           - Non-bleeding gastric ulcer with a clean ulcer                            base (Forrest Class III).                           - Normal duodenal bulb, first portion of the                             duodenum and second portion of the duodenum. -- Avoid NSAIDs -Protonix twice daily advised -- Outpatient colonoscopy advised  3)Persistent Leukocytosis---continue Rocephin for SBP prophylaxis Cirrhotic patient with GI bleed -Blood cultures from 01/26/24 NGTD -AFB culture from ascitic fluid pending -Ascitic fluid bacterial culture pending -Ascitic fluid cytology pending  4)Generalized weakness - This is multifactorial.  Could be partly related to her anemia, hyponatremia as well as liver cirrhosis vs infection -Physical therapy eval appreciated recommends SNF rehab  5) alcohol abuse--- last alcoholic intake according to patient was 01/23/2024 -- Patient admits to drinking almost every day ---Unfortunately patient is not ready to quit to quit drinking etoh -Continue lorazepam per CIWA protocol along with folic acid multivitamin and thiamine  6) acute hypoxic respiratory failure-- Hypoxia improving after paracentesis on 01/26/2024 -Continue to try to wean off O2  7) accidental right arm dog scratches --they do not look infected -patient says dog was vaccinated and it was a scratch not bite -Patient believes that her tetanus is up-to-date  8)Chronic obstructive pulmonary disease (COPD) (HCC) --Acute exacerbation, continue bronchodilators  9)Dyslipidemia -  on Repatha.  10)Type 2 diabetes mellitus with peripheral neuropathy (HCC) -A1c 4.9 reflecting excellent diabetic control PTA - hold home insulin until oral intake is more reliable--- patient is at risk for hypoglycemia Use Novolog/Humalog Sliding scale insulin with Accu-Cheks/Fingersticks as ordered   11)Anxiety and depression -  continue BuSpar, trazodone and Cymbalta  DVT prophylaxis: SCDs Start: 01/26/24 0446    Code Status: Full Code Family Communication:  room mate is primary contact  Disposition Plan: SNF rehab Level of care: Telemetry Status is: Inpatient  Consultants:  GI   Subjective: -Tolerated   EGD well -Wants to eat -Had BM overnight -No fever  Or chills  No vomiting  Objective: Vitals:   01/27/24 0938 01/27/24 0945 01/27/24 1013 01/27/24 1016  BP: 100/62     Pulse: (!) 109     Resp: 15 11    Temp: 98 F (36.7 C)     TempSrc: Oral     SpO2: 100%  99% 99%  Weight:      Height:        Intake/Output Summary (Last 24 hours) at 01/27/2024 1348 Last data filed at 01/27/2024 0905 Gross per 24 hour  Intake 1089.42 ml  Output --  Net 1089.42 ml   Filed Weights   01/25/24 1528  Weight: 75.2 kg   Physical Exam Gen:- Awake Alert, in no acute distress  HEENT:- [place].AT, +ve sclera icterus Nose-  2L/min Neck-Supple Neck,No JVD,.  Lungs- Fair air movement bilaterally, no wheezing or rales CV- S1, S2 normal, RRR Abd-  +ve B.Sounds, Abd Soft, No tenderness,  +ve ascites  Extremity/Skin:- +ve  edema,   good pedal pulses  Psych-affect is appropriate, oriented x3 Neuro-generalized weakness , no new focal deficits, +ve Tremors -  Media Information   Document Information  Photos  Accidental Dog Scratches  01/27/2024 14:30  Attached To:  Hospital Encounter on 01/25/24  Source Information  Shon Hale, MD  [facility]  Document History     Data Reviewed: I have personally reviewed following labs and imaging studies  CBC: Recent Labs  Lab 01/25/24 1635 01/26/24 0338 01/27/24 0547  WBC 16.8* 11.5* 16.5*  NEUTROABS 13.3*  --   --   HGB 9.2* 8.1* 7.4*  HCT 27.7* 23.0* 22.0*  MCV 110.8* 109.0* 111.1*  PLT 134* 114* 109*   Basic Metabolic Panel: Recent Labs  Lab 01/25/24 1635 01/26/24 0338 01/26/24 1226 01/27/24 0547  NA 130* 130*  --  130*  K 3.6 3.4*  --  3.8  CL 90* 94*  --  96*  CO2 24 27  --  25  GLUCOSE 146* 133*  --  144*  BUN 8 10  --  16  CREATININE 1.03* 1.08*  --  1.06*  CALCIUM 8.1* 7.3*  --  7.3*  MG  --   --  0.9* 2.0   GFR: Estimated Creatinine Clearance: 55.9 mL/min (A) (by C-G formula based on SCr of 1.06 mg/dL  (H)). Liver Function Tests: Recent Labs  Lab 01/25/24 1635 01/26/24 1226 01/27/24 0547  AST 63* 51* 43*  ALT 20 22 20   ALKPHOS 157* 130* 116  BILITOT 7.8* 8.6* 7.3*  PROT 6.6 5.7* 5.1*  ALBUMIN 2.3* 2.1* 1.9*   Recent Labs  Lab 01/25/24 1635  LIPASE 34   Recent Labs  Lab 01/25/24 1635  AMMONIA 31   Coagulation Profile: Recent Labs  Lab 01/25/24 1635 01/27/24 0547  INR 1.6* 1.8*   CBG: Recent Labs  Lab 01/26/24 1606 01/26/24  2014 01/27/24 0005 01/27/24 0421 01/27/24 1118  GLUCAP 142* 145* 138* 142* 148*   Anemia Panel: Recent Labs    01/26/24 0338  VITAMINB12 1,006*  FOLATE 3.7*  FERRITIN 885*  TIBC NOT CALCULATED  IRON 101  RETICCTPCT 4.1*   Recent Results (from the past 240 hours)  Respiratory (~20 pathogens) panel by PCR     Status: None   Collection Time: 01/26/24 11:12 AM   Specimen: Nasopharyngeal Swab; Respiratory  Result Value Ref Range Status   Adenovirus NOT DETECTED NOT DETECTED Final   Coronavirus 229E NOT DETECTED NOT DETECTED Final    Comment: (NOTE) The Coronavirus on the Respiratory Panel, DOES NOT test for the novel  Coronavirus (2019 nCoV)    Coronavirus HKU1 NOT DETECTED NOT DETECTED Final   Coronavirus NL63 NOT DETECTED NOT DETECTED Final   Coronavirus OC43 NOT DETECTED NOT DETECTED Final   Metapneumovirus NOT DETECTED NOT DETECTED Final   Rhinovirus / Enterovirus NOT DETECTED NOT DETECTED Final   Influenza A NOT DETECTED NOT DETECTED Final   Influenza B NOT DETECTED NOT DETECTED Final   Parainfluenza Virus 1 NOT DETECTED NOT DETECTED Final   Parainfluenza Virus 2 NOT DETECTED NOT DETECTED Final   Parainfluenza Virus 3 NOT DETECTED NOT DETECTED Final   Parainfluenza Virus 4 NOT DETECTED NOT DETECTED Final   Respiratory Syncytial Virus NOT DETECTED NOT DETECTED Final   Bordetella pertussis NOT DETECTED NOT DETECTED Final   Bordetella Parapertussis NOT DETECTED NOT DETECTED Final   Chlamydophila pneumoniae NOT DETECTED NOT  DETECTED Final   Mycoplasma pneumoniae NOT DETECTED NOT DETECTED Final    Comment: Performed at [facility], [address], [place], [place] [address]  Culture, blood (Routine X 2) w Reflex to ID Panel     Status: None (Preliminary result)   Collection Time: 01/26/24 12:26 PM   Specimen: BLOOD  Result Value Ref Range Status   Specimen Description BLOOD RIGHT ANTECUBITAL  Final   Special Requests   Final    Blood Culture adequate volume BOTTLES DRAWN AEROBIC AND ANAEROBIC   Culture   Final    NO GROWTH < 24 HOURS Performed at [facility], [address] [place], [place] [address]    Report Status PENDING  Incomplete  Culture, blood (Routine X 2) w Reflex to ID Panel     Status: None (Preliminary result)   Collection Time: 01/26/24 12:26 PM   Specimen: BLOOD  Result Value Ref Range Status   Specimen Description BLOOD LEFT ANTECUBITAL  Final   Special Requests   Final    BOTTLES DRAWN AEROBIC AND ANAEROBIC Blood Culture adequate volume   Culture   Final    NO GROWTH < 24 HOURS Performed at [facility], [address], [place], [place] [address]    Report Status PENDING  Incomplete  Gram stain     Status: None   Collection Time: 01/26/24  3:18 PM   Specimen: Ascitic  Result Value Ref Range Status   Specimen Description ASCITIC  Final   Special Requests NONE  Final   Gram Stain   Final    PERITONEAL CYTOSPIN SMEAR NO ORGANISMS SEEN WBC PRESENT,BOTH PMN AND MONONUCLEAR Performed at [facility], [address], [place], [place] [address]    Report Status 01/26/2024 FINAL  Final  Culture, body fluid w Gram Stain-bottle     Status: None (Preliminary result)   Collection Time: 01/26/24  3:18 PM   Specimen: Ascitic  Result Value Ref Range Status  Specimen Description ASCITIC  Final   Special Requests BOTTLES DRAWN AEROBIC AND ANAEROBIC 10CC  Final   Culture   Final    NO GROWTH < 24 HOURS Performed at [facility], [address], [place], [place] [address]    Report  Status PENDING  Incomplete     Radiology Studies: US Paracentesis Result Date: 01/26/2024 INDICATION: Patient with history of ETOH cirrhosis admitted with jaundice, found to have new onset ascites. Request for diagnostic and therapeutic paracentesis. EXAM: ULTRASOUND GUIDED DIAGNOSTIC AND THERAPEUTIC PARACENTESIS MEDICATIONS: 8 mL 1% lidocaine COMPLICATIONS: None immediate. PROCEDURE: Informed written consent was obtained from the patient after a discussion of the risks, benefits and alternatives to treatment. A timeout was performed prior to the initiation of the procedure. Initial ultrasound scanning demonstrates a moderate amount of ascites within the right upper abdominal quadrant. The right upper abdomen was prepped and draped in the usual sterile fashion. 1% lidocaine was used for local anesthesia. Following this, a 19 gauge, 7-cm, Yueh catheter was introduced. An ultrasound image was saved for documentation purposes. The paracentesis was performed. The catheter was removed and a dressing was applied. The patient tolerated the procedure well without immediate post procedural complication. FINDINGS: A total of approximately 2.5 L of clear yellow fluid was removed. Samples were sent to the laboratory as requested by the clinical team. IMPRESSION: Successful ultrasound-guided paracentesis yielding 2.5 liters of peritoneal fluid. Performed by Lynnette Caffey, PA-C Electronically Signed   By: Irish Lack M.D.   On: 01/26/2024 16:00   DG Chest Port 1 View Result Date: 01/25/2024 CLINICAL DATA:  Hypoxia. EXAM: PORTABLE CHEST 1 VIEW COMPARISON:  06/17/2015, lung bases from abdominal CT earlier today FINDINGS: The cardiomediastinal contours are normal. The lungs are clear. Pulmonary vasculature is normal. No consolidation, pleural effusion, or pneumothorax. No acute osseous abnormalities are seen. IMPRESSION: No acute chest findings. Electronically Signed   By: Narda Rutherford M.D.   On: 01/25/2024 23:34    CT ABDOMEN PELVIS W CONTRAST Result Date: 01/25/2024 CLINICAL DATA:  Abdominal pain, acute, nonlocalized EXAM: CT ABDOMEN AND PELVIS WITH CONTRAST TECHNIQUE: Multidetector CT imaging of the abdomen and pelvis was performed using the standard protocol following bolus administration of intravenous contrast. RADIATION DOSE REDUCTION: This exam was performed according to the departmental dose-optimization program which includes automated exposure control, adjustment of the mA and/or kV according to patient size and/or use of iterative reconstruction technique. CONTRAST:  OMNIPAQUE IOHEXOL 300 MG/ML  SOLN COMPARISON:  Ultrasound abdomen 12/15/2023 FINDINGS: Lower chest: No acute abnormality. Hepatobiliary: Nodular hepatic contour. No focal liver abnormality. Status post cholecystectomy. No biliary dilatation. Pancreas: No focal lesion. Normal pancreatic contour. No surrounding inflammatory changes. No main pancreatic ductal dilatation. Spleen: Normal in size without focal abnormality. Adrenals/Urinary Tract: No adrenal nodule bilaterally. Bilateral kidneys enhance symmetrically. Fluid dense lesion likely represents a simple renal cyst. Simple renal cysts, in the absence of clinically indicated signs/symptoms, require no independent follow-up. No hydronephrosis. No hydroureter. The urinary bladder is unremarkable. Stomach/Bowel: Question thinning and slight hazy contour of the greater curvature (2:26, 4:22). No evidence of bowel wall thickening or dilatation. Appendix appears normal. Vascular/Lymphatic: The portal, splenic, superior mesenteric veins are patent. No abdominal aorta or iliac aneurysm. Severe atherosclerotic plaque of the aorta and its branches. No abdominal, pelvic, or inguinal lymphadenopathy. Reproductive: Uterus and bilateral adnexa are unremarkable. Other: Moderate volume simple free fluid ascites. No intraperitoneal free gas. No organized fluid collection. Musculoskeletal: Diffuse  subsegmental atelectasis. No suspicious lytic or blastic  osseous lesions. No acute displaced fracture. IMPRESSION: 1. Question thinning and slight hazy contour of the greater curvature. Underlying ulceration is not excluded. No bowel perforation. Consider correlation with direct visualization. 2. Cirrhosis. No focal liver lesions identified. Please note that liver protocol enhanced MR and CT are the most sensitive tests for the screening detection of hepatocellular carcinoma in the high risk setting of cirrhosis. 3. Moderate volume simple free fluid ascites. 4.  Aortic Atherosclerosis (ICD10-I70.0). Electronically Signed   By: Tish Frederickson M.D.   On: 01/25/2024 22:24   Scheduled Meds:  arformoterol  15 mcg Nebulization BID   And   umeclidinium bromide  1 puff Inhalation Daily   busPIRone  10 mg Oral TID   DULoxetine  60 mg Oral Daily   fluticasone  2 spray Each Nare Daily   folic acid  1 mg Oral Daily   gabapentin  600 mg Oral TID   insulin aspart  0-6 Units Subcutaneous Q4H   insulin aspart protamine- aspart  15 Units Subcutaneous BID WC   multivitamin with minerals  1 tablet Oral Daily   pantoprazole (PROTONIX) IV  40 mg Intravenous Q12H   spironolactone  100 mg Oral Daily   thiamine  100 mg Oral Daily   Or   thiamine  100 mg Intravenous Daily   traZODone  100 mg Oral QHS   Continuous Infusions:  cefTRIAXone (ROCEPHIN)  IV 1 g (01/26/24 1609)     LOS: 2 days   Shon Hale, MD [facility] Available via Epic secure chat 7am-7pm After these hours, please refer to coverage provider listed on [email or web address] 01/27/2024, 1:48 PM

## 2024-01-27 NOTE — Anesthesia Preprocedure Evaluation (Signed)
Anesthesia Evaluation  Patient identified by MRN, date of birth, ID band Patient awake    Reviewed: Allergy & Precautions, H&P , NPO status , Patient's Chart, lab work & pertinent test results, reviewed documented beta blocker date and time   Airway Mallampati: II  TM Distance: >3 FB Neck ROM: full    Dental no notable dental hx.    Pulmonary COPD, Current Smoker and Patient abstained from smoking.   Pulmonary exam normal breath sounds clear to auscultation       Cardiovascular Exercise Tolerance: Good hypertension,  Rhythm:regular Rate:Normal     Neuro/Psych  PSYCHIATRIC DISORDERS Anxiety Depression     Neuromuscular disease    GI/Hepatic ,GERD  ,,(+) Cirrhosis         Endo/Other  diabetes    Renal/GU Renal disease  negative genitourinary   Musculoskeletal   Abdominal   Peds  Hematology  (+) Blood dyscrasia, anemia   Anesthesia Other Findings   Reproductive/Obstetrics negative OB ROS                             Anesthesia Physical Anesthesia Plan  ASA: 4 and emergent  Anesthesia Plan: General   Post-op Pain Management:    Induction:   PONV Risk Score and Plan: Propofol infusion  Airway Management Planned:   Additional Equipment:   Intra-op Plan:   Post-operative Plan:   Informed Consent: I have reviewed the patients History and Physical, chart, labs and discussed the procedure including the risks, benefits and alternatives for the proposed anesthesia with the patient or authorized representative who has indicated his/her understanding and acceptance.     Dental Advisory Given  Plan Discussed with: CRNA  Anesthesia Plan Comments:        Anesthesia Quick Evaluation

## 2024-01-27 NOTE — Progress Notes (Signed)
Patient able to ambulate in room without oxygen, maintaining O2 sats at 93-98%. This nurse noticed SpO2 reading 89% and entered room to find patient asleep. Nasal cannula at 2L/min, O2 reading 98% at this time. Dr. Mariea Clonts made aware.

## 2024-01-27 NOTE — Anesthesia Postprocedure Evaluation (Signed)
Anesthesia Post Note  Patient: Riham Polyakov  Procedure(s) Performed: ESOPHAGOGASTRODUODENOSCOPY (EGD) WITH PROPOFOL  Patient location during evaluation: PACU Anesthesia Type: General Level of consciousness: awake and alert Pain management: pain level controlled Vital Signs Assessment: post-procedure vital signs reviewed and stable Respiratory status: spontaneous breathing, nonlabored ventilation, respiratory function stable and patient connected to nasal cannula oxygen Cardiovascular status: blood pressure returned to baseline and stable Postop Assessment: no apparent nausea or vomiting Anesthetic complications: no   No notable events documented.   Last Vitals:  Vitals:   01/27/24 0905 01/27/24 0915  BP: (!) 113/47 (!) 105/48  Pulse: (!) 113 (!) 110  Resp: (!) 22 14  Temp: 36.7 C   SpO2: 96% 98%    Last Pain:  Vitals:   01/27/24 0428  TempSrc: Oral  PainSc:                  Windell Norfolk

## 2024-01-27 NOTE — Interval H&P Note (Signed)
History and Physical Interval Note:  01/27/2024 8:34 AM  Michelle Michael  has presented today for surgery, with the diagnosis of Anemia, cirrhosis.  The various methods of treatment have been discussed with the patient and family. After consideration of risks, benefits and other options for treatment, the patient has consented to  Procedure(s): ESOPHAGOGASTRODUODENOSCOPY (EGD) WITH PROPOFOL (N/A) as a surgical intervention.  The patient's history has been reviewed, patient examined, no change in status, stable for surgery.  I have reviewed the patient's chart and labs.  Questions were answered to the patient's satisfaction.     Lanelle Bal

## 2024-01-27 NOTE — Transfer of Care (Signed)
Immediate Anesthesia Transfer of Care Note  Patient: Michelle Michael  Procedure(s) Performed: ESOPHAGOGASTRODUODENOSCOPY (EGD) WITH PROPOFOL  Patient Location: PACU  Anesthesia Type:General  Level of Consciousness: awake and alert   Airway & Oxygen Therapy: Patient Spontanous Breathing  Post-op Assessment: Report given to RN and Post -op Vital signs reviewed and stable  Post vital signs: Reviewed and stable  Last Vitals:  Vitals Value Taken Time  BP 105/48 01/27/24 0915  Temp 36.7 C 01/27/24 0905  Pulse 110 01/27/24 0915  Resp 14 01/27/24 0915  SpO2 98 % 01/27/24 0915    Last Pain:  Vitals:   01/27/24 0428  TempSrc: Oral  PainSc:          Complications: No notable events documented.

## 2024-01-27 NOTE — Op Note (Signed)
[facility] Patient Name: Michelle Michael Procedure Date: 01/27/2024 8:33 AM MRN: [id number] Date of Birth: Nov 25, 1961 Attending MD: Hennie Duos. Marletta Lor , [place], [id number] CSN: [id number] Age: 63 Admit Type: Inpatient Procedure:                Upper GI endoscopy Indications:              Iron deficiency anemia secondary to chronic blood                            loss Providers:                Hennie Duos. Marletta Lor, DO, Nena Polio, RN, Durwin Glaze Tech, Technician Referring MD:              Medicines:                See the Anesthesia note for documentation of the                            administered medications Complications:            No immediate complications. Estimated Blood Loss:     Estimated blood loss: none. Procedure:                Pre-Anesthesia Assessment:                           - The anesthesia plan was to use monitored                            anesthesia care (MAC).                           After obtaining informed consent, the endoscope was                            passed under direct vision. Throughout the                            procedure, the patient's blood pressure, pulse, and                            oxygen saturations were monitored continuously. The                            GIF-H190 ([id number]) scope was introduced through the                            mouth, and advanced to the second part of duodenum.                            The upper GI endoscopy was accomplished without                            difficulty. The patient tolerated the  procedure                            well. Scope In: 8:57:22 AM Scope Out: 9:01:23 AM Total Procedure Duration: 0 hours 4 minutes 1 second  Findings:      A small hiatal hernia was present.      A mild Schatzki ring was found in the mid esophagus.      There is no endoscopic evidence of varices in the entire esophagus.      Moderate portal hypertensive gastropathy was found in  the gastric body.      One non-bleeding nearly healed cratered gastric ulcer was found at the       pylorus. The lesion was 8 mm in largest dimension.      Three non-bleeding cratered gastric ulcers with a clean ulcer base       (Forrest Class III) were found in the gastric body. The largest lesion       was 5 mm in largest dimension.      The duodenal bulb, first portion of the duodenum and second portion of       the duodenum were normal. Impression:               - Small hiatal hernia.                           - Mild Schatzki ring.                           - Portal hypertensive gastropathy.                           - Non-bleeding gastric ulcer with a clean ulcer                            base (Forrest Class III).                           - Normal duodenal bulb, first portion of the                            duodenum and second portion of the duodenum.                           - No specimens collected. Moderate Sedation:      Per Anesthesia Care Recommendation:           - Return patient to hospital ward for ongoing care.                           - Low sodium diet.                           - Use a proton pump inhibitor PO BID.                           - No ibuprofen, naproxen, or other non-steroidal  anti-inflammatory drugs.                           - Stop all alcohol use                           - Update colonoscopy in outpatient setting                           - GI bleed likely from multiple ulcers which has                            since stopped. Procedure Code(s):        --- Professional ---                           [phone], Esophagogastroduodenoscopy, flexible,                            transoral; diagnostic, including collection of                            specimen(s) by brushing or washing, when performed                            (separate procedure) Diagnosis Code(s):        --- Professional ---                           K44.9,  Diaphragmatic hernia without obstruction or                            gangrene                           K22.2, Esophageal obstruction                           K76.6, Portal hypertension                           K31.89, Other diseases of stomach and duodenum                           K25.9, Gastric ulcer, unspecified as acute or                            chronic, without hemorrhage or perforation                           D50.0, Iron deficiency anemia secondary to blood                            loss (chronic) CPT copyright 2022 American Medical Association. All rights reserved. The codes documented in this report are preliminary and upon coder review may  be revised to meet current compliance requirements. Hennie Duos. Marletta Lor, DO Hennie Duos. Jodie Leiner, DO 01/27/2024 9:50:12 AM This report has  been signed electronically. Number of Addenda: 0

## 2024-01-27 DEATH — deceased

## 2024-01-28 DIAGNOSIS — D500 Iron deficiency anemia secondary to blood loss (chronic): Secondary | ICD-10-CM | POA: Diagnosis not present

## 2024-01-28 DIAGNOSIS — R17 Unspecified jaundice: Secondary | ICD-10-CM

## 2024-01-28 DIAGNOSIS — K254 Chronic or unspecified gastric ulcer with hemorrhage: Secondary | ICD-10-CM | POA: Diagnosis not present

## 2024-01-28 DIAGNOSIS — K7031 Alcoholic cirrhosis of liver with ascites: Secondary | ICD-10-CM | POA: Diagnosis not present

## 2024-01-28 DIAGNOSIS — G9340 Encephalopathy, unspecified: Secondary | ICD-10-CM

## 2024-01-28 DIAGNOSIS — D649 Anemia, unspecified: Secondary | ICD-10-CM

## 2024-01-28 DIAGNOSIS — G312 Degeneration of nervous system due to alcohol: Secondary | ICD-10-CM

## 2024-01-28 DIAGNOSIS — K7011 Alcoholic hepatitis with ascites: Secondary | ICD-10-CM | POA: Diagnosis not present

## 2024-01-28 DIAGNOSIS — E785 Hyperlipidemia, unspecified: Secondary | ICD-10-CM

## 2024-01-28 DIAGNOSIS — E1142 Type 2 diabetes mellitus with diabetic polyneuropathy: Secondary | ICD-10-CM

## 2024-01-28 DIAGNOSIS — D62 Acute posthemorrhagic anemia: Secondary | ICD-10-CM

## 2024-01-28 DIAGNOSIS — K766 Portal hypertension: Secondary | ICD-10-CM | POA: Diagnosis not present

## 2024-01-28 DIAGNOSIS — K3189 Other diseases of stomach and duodenum: Secondary | ICD-10-CM | POA: Diagnosis not present

## 2024-01-28 DIAGNOSIS — K259 Gastric ulcer, unspecified as acute or chronic, without hemorrhage or perforation: Secondary | ICD-10-CM | POA: Diagnosis not present

## 2024-01-28 DIAGNOSIS — R935 Abnormal findings on diagnostic imaging of other abdominal regions, including retroperitoneum: Secondary | ICD-10-CM | POA: Diagnosis not present

## 2024-01-28 DIAGNOSIS — F1090 Alcohol use, unspecified, uncomplicated: Secondary | ICD-10-CM | POA: Diagnosis not present

## 2024-01-28 DIAGNOSIS — K222 Esophageal obstruction: Secondary | ICD-10-CM | POA: Diagnosis not present

## 2024-01-28 LAB — RENAL FUNCTION PANEL
Albumin: 2.1 g/dL — ABNORMAL LOW (ref 3.5–5.0)
Anion gap: 12 (ref 5–15)
BUN: 19 mg/dL (ref 8–23)
CO2: 23 mmol/L (ref 22–32)
Calcium: 7.7 mg/dL — ABNORMAL LOW (ref 8.9–10.3)
Chloride: 97 mmol/L — ABNORMAL LOW (ref 98–111)
Creatinine, Ser: 1.26 mg/dL — ABNORMAL HIGH (ref 0.44–1.00)
GFR, Estimated: 48 mL/min — ABNORMAL LOW (ref >60)
Glucose, Bld: 152 mg/dL — ABNORMAL HIGH (ref 70–99)
Phosphorus: 2.4 mg/dL — ABNORMAL LOW (ref 2.5–4.6)
Potassium: 3.6 mmol/L (ref 3.5–5.1)
Sodium: 132 mmol/L — ABNORMAL LOW (ref 135–145)

## 2024-01-28 LAB — GLUCOSE, CAPILLARY
Glucose-Capillary: 106 mg/dL — ABNORMAL HIGH (ref 70–99)
Glucose-Capillary: 120 mg/dL — ABNORMAL HIGH (ref 70–99)
Glucose-Capillary: 136 mg/dL — ABNORMAL HIGH (ref 70–99)
Glucose-Capillary: 151 mg/dL — ABNORMAL HIGH (ref 70–99)
Glucose-Capillary: 162 mg/dL — ABNORMAL HIGH (ref 70–99)
Glucose-Capillary: 175 mg/dL — ABNORMAL HIGH (ref 70–99)
Glucose-Capillary: 234 mg/dL — ABNORMAL HIGH (ref 70–99)

## 2024-01-28 LAB — BPAM RBC
Blood Product Expiration Date: 202502242359
ISSUE DATE / TIME: 202502011751
Unit Type and Rh: 5100

## 2024-01-28 LAB — HEPATIC FUNCTION PANEL
ALT: 23 U/L (ref 0–44)
AST: 46 U/L — ABNORMAL HIGH (ref 15–41)
Albumin: 2 g/dL — ABNORMAL LOW (ref 3.5–5.0)
Alkaline Phosphatase: 156 U/L — ABNORMAL HIGH (ref 38–126)
Bilirubin, Direct: 2.3 mg/dL — ABNORMAL HIGH (ref 0.0–0.2)
Indirect Bilirubin: 3.6 mg/dL — ABNORMAL HIGH (ref 0.3–0.9)
Total Bilirubin: 5.9 mg/dL — ABNORMAL HIGH (ref 0.0–1.2)
Total Protein: 5.4 g/dL — ABNORMAL LOW (ref 6.5–8.1)

## 2024-01-28 LAB — TYPE AND SCREEN
ABO/RH(D): O POS
Antibody Screen: NEGATIVE
Unit division: 0

## 2024-01-28 LAB — CBC
HCT: 27.9 % — ABNORMAL LOW (ref 36.0–46.0)
Hemoglobin: 9.2 g/dL — ABNORMAL LOW (ref 12.0–15.0)
MCH: 34.7 pg — ABNORMAL HIGH (ref 26.0–34.0)
MCHC: 33 g/dL (ref 30.0–36.0)
MCV: 105.3 fL — ABNORMAL HIGH (ref 80.0–100.0)
Platelets: 110 10*3/uL — ABNORMAL LOW (ref 150–400)
RBC: 2.65 MIL/uL — ABNORMAL LOW (ref 3.87–5.11)
RDW: 22.5 % — ABNORMAL HIGH (ref 11.5–15.5)
WBC: 17.8 10*3/uL — ABNORMAL HIGH (ref 4.0–10.5)
nRBC: 0 % (ref 0.0–0.2)

## 2024-01-28 LAB — PROTIME-INR
INR: 1.7 — ABNORMAL HIGH (ref 0.8–1.2)
Prothrombin Time: 20.1 s — ABNORMAL HIGH (ref 11.4–15.2)

## 2024-01-28 LAB — AMMONIA: Ammonia: 52 umol/L — ABNORMAL HIGH (ref 9–35)

## 2024-01-28 MED ORDER — LACTULOSE 10 GM/15ML PO SOLN
20.0000 g | Freq: Two times a day (BID) | ORAL | Status: DC
  Administered 2024-01-28 – 2024-02-01 (×9): 20 g via ORAL
  Filled 2024-01-28 (×10): qty 30

## 2024-01-28 MED ORDER — MAGNESIUM OXIDE -MG SUPPLEMENT 400 (240 MG) MG PO TABS
400.0000 mg | ORAL_TABLET | Freq: Every day | ORAL | Status: DC
  Administered 2024-01-28 – 2024-01-31 (×4): 400 mg via ORAL
  Filled 2024-01-28 (×5): qty 1

## 2024-01-28 NOTE — Progress Notes (Signed)
Subjective: Patient seen and examined.  More confused today.  Discussed with PT who states patient did much worse today with physical therapy.  Had a lot of difficulty walking hallway.  Objective: Vital signs in last 24 hours: Temp:  [97.7 F (36.5 C)-98.7 F (37.1 C)] 98 F (36.7 C) (02/02 0500) Pulse Rate:  [107-121] 121 (02/02 0500) Resp:  [12-16] 16 (02/02 0500) BP: (91-106)/(48-54) 101/53 (02/02 0500) SpO2:  [93 %-99 %] 96 % (02/02 0808) Last BM Date : 01/28/24 General:   Alert pleasant, confused Head:  Normocephalic and atraumatic. Eyes: Scleral icterus. Conjuctiva pink.  Abdomen:  Bowel sounds present, soft, non-tender, non-distended. No HSM or hernias noted. No rebound or guarding. No masses appreciated  Msk:  Symmetrical without gross deformities. Normal posture. Extremities:  Without clubbing or edema. Neurologic: Confused, significant asterixis Skin:  Warm and dry, intact without significant lesions.  Cervical Nodes:  No significant cervical adenopathy. Psych:  Alert and cooperative. Normal mood and affect.  Intake/Output from previous day: 02/01 0701 - 02/02 0700 In: 946 [P.O.:480; I.V.:51; Blood:315; IV Piggyback:100] Out: -  Intake/Output this shift: No intake/output data recorded.  Lab Results: Recent Labs    01/26/24 0338 01/27/24 0547 01/28/24 0454  WBC 11.5* 16.5* 17.8*  HGB 8.1* 7.4* 9.2*  HCT 23.0* 22.0* 27.9*  PLT 114* 109* 110*   BMET Recent Labs    01/26/24 0338 01/27/24 0547 01/28/24 0454  NA 130* 130* 132*  K 3.4* 3.8 3.6  CL 94* 96* 97*  CO2 27 25 23   GLUCOSE 133* 144* 152*  BUN 10 16 19   CREATININE 1.08* 1.06* 1.26*  CALCIUM 7.3* 7.3* 7.7*   LFT Recent Labs    01/25/24 1635 01/26/24 1226 01/27/24 0547 01/28/24 0454  PROT 6.6 5.7* 5.1*  --   ALBUMIN 2.3* 2.1* 1.9* 2.1*  AST 63* 51* 43*  --   ALT 20 22 20   --   ALKPHOS 157* 130* 116  --   BILITOT 7.8* 8.6* 7.3*  --   BILIDIR 2.9* 3.2*  --   --   IBILI 4.9* 5.4*  --   --     PT/INR Recent Labs    01/27/24 0547 01/28/24 0454  LABPROT 21.5* 20.1*  INR 1.8* 1.7*   Hepatitis Panel Recent Labs    01/26/24 0338  HEPBSAG NON REACTIVE  HCVAB NON REACTIVE  HEPAIGM NON REACTIVE  HEPBIGM NON REACTIVE     Studies/Results: US Paracentesis Result Date: 01/26/2024 INDICATION: Patient with history of ETOH cirrhosis admitted with jaundice, found to have new onset ascites. Request for diagnostic and therapeutic paracentesis. EXAM: ULTRASOUND GUIDED DIAGNOSTIC AND THERAPEUTIC PARACENTESIS MEDICATIONS: 8 mL 1% lidocaine COMPLICATIONS: None immediate. PROCEDURE: Informed written consent was obtained from the patient after a discussion of the risks, benefits and alternatives to treatment. A timeout was performed prior to the initiation of the procedure. Initial ultrasound scanning demonstrates a moderate amount of ascites within the right upper abdominal quadrant. The right upper abdomen was prepped and draped in the usual sterile fashion. 1% lidocaine was used for local anesthesia. Following this, a 19 gauge, 7-cm, Yueh catheter was introduced. An ultrasound image was saved for documentation purposes. The paracentesis was performed. The catheter was removed and a dressing was applied. The patient tolerated the procedure well without immediate post procedural complication. FINDINGS: A total of approximately 2.5 L of clear yellow fluid was removed. Samples were sent to the laboratory as requested by the clinical team. IMPRESSION: Successful ultrasound-guided paracentesis yielding 2.5 liters  of peritoneal fluid. Performed by Lynnette Caffey, PA-C Electronically Signed   By: Irish Lack M.D.   On: 01/26/2024 16:00    Assessment/Plan:  1.  Acute on chronic anemia-having jellylike stools, possible melena.  Hemoglobin stable at 9.2.    EGD to 125 small hiatal hernia, mild Schatzki's ring, no esophageal varices, moderate portal hypertensive gastropathy in the gastric body, 1  nonbleeding nearly healed cratered ulcer pylorus, 3 nonbleeding cratered gastric ulcers in the gastric body, normal duodenum.  Continue on PPI twice daily.    IV antibiotics x 5 days for SBP prophylaxis.   2.  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  Not a transplant candidate given ongoing alcohol use.  Would hold off on steroids at this time given concern for infection as well as ongoing alcohol use.  Patient is not interested in stopping at this time. Defer further infectious workup to hospitalist.   Needs daily LFTs and coags.  3.  Encephalopathy-worsened today, possible alcohol withdrawal versus hepatic encephalopathy.  Check ammonia level.  May need to start on lactulose and Xifaxan if elevated.  Significant asterixis on exam today.  Continue CIWA protocol, continue thiamine.  4.  Portal hypertension with ascites-paracentesis 01/26/2024 2.5 L removed, negative for SBP.  Hennie Duos. Marletta Lor, D.O. Gastroenterology and Hepatology [facility]   LOS: 3 days    01/28/2024, 10:11 AM

## 2024-01-28 NOTE — Progress Notes (Signed)
PROGRESS NOTE  Michelle Michael  WGN:[id number] DOB: 08/10/1961 DOA: 01/25/2024 PCP: Michelle Lade, MD    Brief Narrative:  63 y.o. female with medical history significant for COPD, type 2 diabetes mellitus, GERD, hypertension, dyslipidemia, depression and anxiety, who presented to the emergency room with acute onset of generalized weakness and chills without reported fever as well as jaundice.  She was seen by Michelle Michael her primary care physician and given her jaundice was referred to the emergency room.  She denied any chest pain however has been having occasional dyspnea and dry cough.  She admitted to nausea and vomiting earlier today.  She admits to heartburn without significant abdominal pain.  - Physical therapist recommends SNF rehab  Assessment and Plan:  1)Alcoholic Liver Cirrhosis with Ascites ---- -continue Rocephin for SBP prophylaxis in the cirrhotic patient with GI bleed -Continue Aldactone -positive asterixis and concerns for encephalopathy development -started on lactulose. -Patient is not a transplant candidate due to ongoing alcohol use -Ascitic fluid from paracentesis dated 01/26/2024--negative Gram stain and cell count not consistent with infection --AFB culture from ascitic fluid pending -Ascitic fluid bacterial culture pending -Ascitic fluid cytology pending --Unfortunately patient is not ready to quit to quit drinking etoh  2) acute on chronic symptomatic anemia--concerns for melena CT abdomen and pelvis consistent with liver cirrhosis as well as suspected gastric greater curvature ulcer. -Hgb currently down to 7.4 from 9.2 -Will transfuse 1 unit of PRBC on 01/27/24 due to symptomatic anemia -EGD on 01/27/2024 shows Small hiatal hernia. Mild Schatzki ring.                           - Portal hypertensive gastropathy.                           - Non-bleeding gastric ulcer with a clean ulcer                            base (Forrest Class III).                            - Normal duodenal bulb, first portion of the                            duodenum and second portion of the duodenum. --continue to Avoid NSAIDs -Protonix twice daily advised and encouraged  -- Outpatient colonoscopy recommended by GI  3)Persistent Leukocytosis---continue Rocephin for SBP prophylaxis Cirrhotic patient with GI bleed -Blood cultures from 01/26/24 NGTD -AFB culture from ascitic fluid pending -Ascitic fluid bacterial culture pending -Ascitic fluid cytology pending  4)Generalized weakness - This is multifactorial.  Could be partly related to her anemia, hyponatremia as well as liver cirrhosis vs infection -Physical therapy eval appreciated recommends SNF rehab  5) alcohol abuse/asterixis and elevated ammonia --- last alcoholic intake according to patient was 01/23/2024 -- Patient admits to drinking almost every day ---Unfortunately patient is not ready to quit to quit drinking etoh -Continue lorazepam per CIWA protocol along with folic acid multivitamin and thiamine -Concerning for development of encephalopathy; lactulose will be initiated -Ammonia level around 52.  Patient with positive asterixis   6) acute hypoxic respiratory failure-- -no use of accessory muscle appreciated on exam; 2 L of colostomy mentation in place. -Continue to wean oxygen supplementation  as tolerated.  7) accidental right arm dog scratches --they do not look infected -patient says dog was vaccinated and it was a scratch not bite -Patient believes that her tetanus is up-to-date -Continue close monitoring.  No superimposed infection or drainage appreciated.  8)Chronic obstructive pulmonary disease (COPD) (HCC) --Acute exacerbation, continue bronchodilators -No wheezing on exam.  9)Dyslipidemia - on Repatha. -Heart healthy diet discussed with patient.  10)Type 2 diabetes mellitus with peripheral neuropathy (HCC) -A1c 4.9 reflecting excellent diabetic control PTA - hold home insulin until  oral intake is more reliable--- patient is at risk for hypoglycemia Use Novolog/Humalog Sliding scale insulin with Accu-Cheks/Fingersticks as ordered   11)Anxiety and depression - continue BuSpar, trazodone and Cymbalta -No suicidal ideation hallucination.  DVT prophylaxis: SCDs Start: 01/26/24 0446    Code Status: Full Code Family Communication:  room mate is primary contact  Disposition Plan: SNF rehab Level of care: Telemetry Status is: Inpatient  Consultants:  GI   Subjective: Somnolent, no chest pain, no nausea vomiting reported.  Positive axillary cysts intermittently confused and significantly generalized weakness on exam.  Objective: Vitals:   01/27/24 1811 01/27/24 2106 01/28/24 0500 01/28/24 0808  BP: (!) 91/48 (!) 96/48 (!) 101/53   Pulse: (!) 107 (!) 108 (!) 121   Resp: 14 12 16    Temp: 98 F (36.7 C) 98.7 F (37.1 C) 98 F (36.7 C)   TempSrc: Axillary Oral Oral   SpO2:  98% 93% 96%  Weight:      Height:        Intake/Output Summary (Last 24 hours) at 01/28/2024 1356 Last data filed at 01/28/2024 0500 Gross per 24 hour  Intake 896 ml  Output --  Net 896 ml   Filed Weights   01/25/24 1528  Weight: 75.2 kg   Physical Exam General exam: Somnolent, intermittently confused and with positive asterixis.  Jaundice appreciated on exam. Respiratory system: 2 L nasal cannula supplementation in place; no using accessory muscle.  No wheezing or crackles on exam. Cardiovascular system: Sinus tachycardia, no rubs, no gallops, no JVD on exam. Gastrointestinal system: Abdomen is nondistended, soft and with positive bowel sounds.  Positive fluid wave consistent with ascites on examination. Central nervous system: Moving 4 limbs spontaneously.  No focal neurological deficits. Extremities: No cyanosis or clubbing; trace edema appreciated bilaterally. Skin: No petechiae.  Right forearm with sounds dark scratches/scabs without signs of superimposed purulent drainage or  infection.  See picture below for details. Psychiatry: Judgement and insight appear normal. Mood & affect appropriate.     Media Information   Document Information    Data Reviewed: I have personally reviewed following labs and imaging studies  CBC: Recent Labs  Lab 01/25/24 1635 01/26/24 0338 01/27/24 0547 01/28/24 0454  WBC 16.8* 11.5* 16.5* 17.8*  NEUTROABS 13.3*  --   --   --   HGB 9.2* 8.1* 7.4* 9.2*  HCT 27.7* 23.0* 22.0* 27.9*  MCV 110.8* 109.0* 111.1* 105.3*  PLT 134* 114* 109* 110*   Basic Metabolic Panel: Recent Labs  Lab 01/25/24 1635 01/26/24 0338 01/26/24 1226 01/27/24 0547 01/28/24 0454  NA 130* 130*  --  130* 132*  K 3.6 3.4*  --  3.8 3.6  CL 90* 94*  --  96* 97*  CO2 24 27  --  25 23  GLUCOSE 146* 133*  --  144* 152*  BUN 8 10  --  16 19  CREATININE 1.03* 1.08*  --  1.06* 1.26*  CALCIUM 8.1* 7.3*  --  7.3* 7.7*  MG  --   --  0.9* 2.0  --   PHOS  --   --   --   --  2.4*   GFR: Estimated Creatinine Clearance: 47 mL/min (A) (by C-G formula based on SCr of 1.26 mg/dL (H))..  Liver Function Tests: Recent Labs  Lab 01/25/24 1635 01/26/24 1226 01/27/24 0547 01/28/24 0454 01/28/24 1109  AST 63* 51* 43*  --  46*  ALT 20 22 20   --  23  ALKPHOS 157* 130* 116  --  156*  BILITOT 7.8* 8.6* 7.3*  --  5.9*  PROT 6.6 5.7* 5.1*  --  5.4*  ALBUMIN 2.3* 2.1* 1.9* 2.1* 2.0*   Recent Labs  Lab 01/25/24 1635  LIPASE 34   Recent Labs  Lab 01/25/24 1635 01/28/24 1109  AMMONIA 31 52*   Coagulation Profile: Recent Labs  Lab 01/25/24 1635 01/27/24 0547 01/28/24 0454  INR 1.6* 1.8* 1.7*   CBG: Recent Labs  Lab 01/27/24 1953 01/28/24 0014 01/28/24 0417 01/28/24 0743 01/28/24 1114  GLUCAP 129* 175* 162* 120* 234*   Anemia Panel: Recent Labs    01/26/24 0338  VITAMINB12 1,006*  FOLATE 3.7*  FERRITIN 885*  TIBC NOT CALCULATED  IRON 101  RETICCTPCT 4.1*   Recent Results (from the past 240 hours)  Respiratory (~20 pathogens) panel  by PCR     Status: None   Collection Time: 01/26/24 11:12 AM   Specimen: Nasopharyngeal Swab; Respiratory  Result Value Ref Range Status   Adenovirus NOT DETECTED NOT DETECTED Final   Coronavirus 229E NOT DETECTED NOT DETECTED Final    Comment: (NOTE) The Coronavirus on the Respiratory Panel, DOES NOT test for the novel  Coronavirus (2019 nCoV)    Coronavirus HKU1 NOT DETECTED NOT DETECTED Final   Coronavirus NL63 NOT DETECTED NOT DETECTED Final   Coronavirus OC43 NOT DETECTED NOT DETECTED Final   Metapneumovirus NOT DETECTED NOT DETECTED Final   Rhinovirus / Enterovirus NOT DETECTED NOT DETECTED Final   Influenza A NOT DETECTED NOT DETECTED Final   Influenza B NOT DETECTED NOT DETECTED Final   Parainfluenza Virus 1 NOT DETECTED NOT DETECTED Final   Parainfluenza Virus 2 NOT DETECTED NOT DETECTED Final   Parainfluenza Virus 3 NOT DETECTED NOT DETECTED Final   Parainfluenza Virus 4 NOT DETECTED NOT DETECTED Final   Respiratory Syncytial Virus NOT DETECTED NOT DETECTED Final   Bordetella pertussis NOT DETECTED NOT DETECTED Final   Bordetella Parapertussis NOT DETECTED NOT DETECTED Final   Chlamydophila pneumoniae NOT DETECTED NOT DETECTED Final   Mycoplasma pneumoniae NOT DETECTED NOT DETECTED Final    Comment: Performed at [facility], [address] [place], [place] [address]  Culture, blood (Routine X 2) w Reflex to ID Panel     Status: None (Preliminary result)   Collection Time: 01/26/24 12:26 PM   Specimen: BLOOD  Result Value Ref Range Status   Specimen Description BLOOD RIGHT ANTECUBITAL  Final   Special Requests   Final    Blood Culture adequate volume BOTTLES DRAWN AEROBIC AND ANAEROBIC   Culture   Final    NO GROWTH 2 DAYS Performed at [facility], [address], [place], [place] [address]    Report Status PENDING  Incomplete  Culture, blood (Routine X 2) w Reflex to ID Panel     Status: None (Preliminary result)   Collection Time: 01/26/24 12:26 PM    Specimen: BLOOD  Result Value Ref Range Status   Specimen  Description BLOOD LEFT ANTECUBITAL  Final   Special Requests   Final    BOTTLES DRAWN AEROBIC AND ANAEROBIC Blood Culture adequate volume   Culture   Final    NO GROWTH 2 DAYS Performed at [facility], [address], [place], [place] [address]    Report Status PENDING  Incomplete  Gram stain     Status: None   Collection Time: 01/26/24  3:18 PM   Specimen: Ascitic  Result Value Ref Range Status   Specimen Description ASCITIC  Final   Special Requests NONE  Final   Gram Stain   Final    PERITONEAL CYTOSPIN SMEAR NO ORGANISMS SEEN WBC PRESENT,BOTH PMN AND MONONUCLEAR Performed at [facility], [address] [place], [place] [address]    Report Status 01/26/2024 FINAL  Final  Culture, body fluid w Gram Stain-bottle     Status: None (Preliminary result)   Collection Time: 01/26/24  3:18 PM   Specimen: Ascitic  Result Value Ref Range Status   Specimen Description ASCITIC  Final   Special Requests BOTTLES DRAWN AEROBIC AND ANAEROBIC 10CC  Final   Culture   Final    NO GROWTH 2 DAYS Performed at [facility], [address] [place], [place] [address]    Report Status PENDING  Incomplete     Radiology Studies: US Paracentesis Result Date: 01/26/2024 INDICATION: Patient with history of ETOH cirrhosis admitted with jaundice, found to have new onset ascites. Request for diagnostic and therapeutic paracentesis. EXAM: ULTRASOUND GUIDED DIAGNOSTIC AND THERAPEUTIC PARACENTESIS MEDICATIONS: 8 mL 1% lidocaine COMPLICATIONS: None immediate. PROCEDURE: Informed written consent was obtained from the patient after a discussion of the risks, benefits and alternatives to treatment. A timeout was performed prior to the initiation of the procedure. Initial ultrasound scanning demonstrates a moderate amount of ascites within the right upper abdominal quadrant. The right upper abdomen was prepped and draped in the usual sterile fashion. 1%  lidocaine was used for local anesthesia. Following this, a 19 gauge, 7-cm, Yueh catheter was introduced. An ultrasound image was saved for documentation purposes. The paracentesis was performed. The catheter was removed and a dressing was applied. The patient tolerated the procedure well without immediate post procedural complication. FINDINGS: A total of approximately 2.5 L of clear yellow fluid was removed. Samples were sent to the laboratory as requested by the clinical team. IMPRESSION: Successful ultrasound-guided paracentesis yielding 2.5 liters of peritoneal fluid. Performed by Lynnette Caffey, PA-C Electronically Signed   By: Irish Lack M.D.   On: 01/26/2024 16:00   Scheduled Meds:  arformoterol  15 mcg Nebulization BID   And   umeclidinium bromide  1 puff Inhalation Daily   busPIRone  10 mg Oral TID   DULoxetine  60 mg Oral Daily   fluticasone  2 spray Each Nare Daily   folic acid  1 mg Oral Daily   gabapentin  600 mg Oral TID   insulin aspart  0-6 Units Subcutaneous Q4H   insulin aspart protamine- aspart  15 Units Subcutaneous BID WC   lactulose  20 g Oral BID   magnesium oxide  400 mg Oral Daily   multivitamin with minerals  1 tablet Oral Daily   pantoprazole (PROTONIX) IV  40 mg Intravenous Q12H   spironolactone  100 mg Oral Daily   thiamine  100 mg Oral Daily   Or   thiamine  100 mg Intravenous Daily   traZODone  100 mg Oral QHS   Continuous Infusions:  cefTRIAXone (ROCEPHIN)  IV Stopped (01/27/24 1727)     LOS: 3 days   Vassie Loll, MD [facility] Available via Epic secure chat 7am-7pm After these hours, please refer to coverage provider listed on [email or web address] 01/28/2024, 1:56 PM

## 2024-01-29 ENCOUNTER — Ambulatory Visit: Payer: Medicare HMO | Admitting: Nurse Practitioner

## 2024-01-29 ENCOUNTER — Encounter (HOSPITAL_COMMUNITY): Payer: Self-pay | Admitting: Internal Medicine

## 2024-01-29 DIAGNOSIS — G9340 Encephalopathy, unspecified: Secondary | ICD-10-CM | POA: Diagnosis not present

## 2024-01-29 DIAGNOSIS — K713 Toxic liver disease with chronic persistent hepatitis: Secondary | ICD-10-CM

## 2024-01-29 DIAGNOSIS — K254 Chronic or unspecified gastric ulcer with hemorrhage: Secondary | ICD-10-CM | POA: Diagnosis not present

## 2024-01-29 DIAGNOSIS — D649 Anemia, unspecified: Secondary | ICD-10-CM | POA: Diagnosis not present

## 2024-01-29 DIAGNOSIS — D62 Acute posthemorrhagic anemia: Secondary | ICD-10-CM | POA: Diagnosis not present

## 2024-01-29 DIAGNOSIS — E785 Hyperlipidemia, unspecified: Secondary | ICD-10-CM | POA: Diagnosis not present

## 2024-01-29 DIAGNOSIS — R17 Unspecified jaundice: Secondary | ICD-10-CM | POA: Diagnosis not present

## 2024-01-29 DIAGNOSIS — D509 Iron deficiency anemia, unspecified: Secondary | ICD-10-CM | POA: Diagnosis not present

## 2024-01-29 DIAGNOSIS — E1142 Type 2 diabetes mellitus with diabetic polyneuropathy: Secondary | ICD-10-CM | POA: Diagnosis not present

## 2024-01-29 DIAGNOSIS — K7011 Alcoholic hepatitis with ascites: Secondary | ICD-10-CM | POA: Diagnosis not present

## 2024-01-29 DIAGNOSIS — F1090 Alcohol use, unspecified, uncomplicated: Secondary | ICD-10-CM | POA: Diagnosis not present

## 2024-01-29 DIAGNOSIS — K766 Portal hypertension: Secondary | ICD-10-CM | POA: Diagnosis not present

## 2024-01-29 DIAGNOSIS — G312 Degeneration of nervous system due to alcohol: Secondary | ICD-10-CM | POA: Diagnosis not present

## 2024-01-29 DIAGNOSIS — K7031 Alcoholic cirrhosis of liver with ascites: Secondary | ICD-10-CM | POA: Diagnosis not present

## 2024-01-29 LAB — CBC WITH DIFFERENTIAL/PLATELET
Abs Immature Granulocytes: 0.07 10*3/uL (ref 0.00–0.07)
Basophils Absolute: 0.1 10*3/uL (ref 0.0–0.1)
Basophils Relative: 1 %
Eosinophils Absolute: 0 10*3/uL (ref 0.0–0.5)
Eosinophils Relative: 0 %
HCT: 26.9 % — ABNORMAL LOW (ref 36.0–46.0)
Hemoglobin: 8.8 g/dL — ABNORMAL LOW (ref 12.0–15.0)
Immature Granulocytes: 1 %
Lymphocytes Relative: 8 %
Lymphs Abs: 1 10*3/uL (ref 0.7–4.0)
MCH: 34.9 pg — ABNORMAL HIGH (ref 26.0–34.0)
MCHC: 32.7 g/dL (ref 30.0–36.0)
MCV: 106.7 fL — ABNORMAL HIGH (ref 80.0–100.0)
Monocytes Absolute: 1 10*3/uL (ref 0.1–1.0)
Monocytes Relative: 8 %
Neutro Abs: 10.1 10*3/uL — ABNORMAL HIGH (ref 1.7–7.7)
Neutrophils Relative %: 82 %
Platelets: 103 10*3/uL — ABNORMAL LOW (ref 150–400)
RBC: 2.52 MIL/uL — ABNORMAL LOW (ref 3.87–5.11)
RDW: 21.1 % — ABNORMAL HIGH (ref 11.5–15.5)
WBC: 12.3 10*3/uL — ABNORMAL HIGH (ref 4.0–10.5)
nRBC: 0 % (ref 0.0–0.2)

## 2024-01-29 LAB — COMPREHENSIVE METABOLIC PANEL
ALT: 22 U/L (ref 0–44)
AST: 40 U/L (ref 15–41)
Albumin: 1.8 g/dL — ABNORMAL LOW (ref 3.5–5.0)
Alkaline Phosphatase: 138 U/L — ABNORMAL HIGH (ref 38–126)
Anion gap: 8 (ref 5–15)
BUN: 22 mg/dL (ref 8–23)
CO2: 26 mmol/L (ref 22–32)
Calcium: 8 mg/dL — ABNORMAL LOW (ref 8.9–10.3)
Chloride: 98 mmol/L (ref 98–111)
Creatinine, Ser: 1.22 mg/dL — ABNORMAL HIGH (ref 0.44–1.00)
GFR, Estimated: 50 mL/min — ABNORMAL LOW (ref >60)
Glucose, Bld: 161 mg/dL — ABNORMAL HIGH (ref 70–99)
Potassium: 3.7 mmol/L (ref 3.5–5.1)
Sodium: 132 mmol/L — ABNORMAL LOW (ref 135–145)
Total Bilirubin: 4.9 mg/dL — ABNORMAL HIGH (ref 0.0–1.2)
Total Protein: 5.1 g/dL — ABNORMAL LOW (ref 6.5–8.1)

## 2024-01-29 LAB — PROTIME-INR
INR: 1.8 — ABNORMAL HIGH (ref 0.8–1.2)
Prothrombin Time: 20.7 s — ABNORMAL HIGH (ref 11.4–15.2)

## 2024-01-29 LAB — GLUCOSE, CAPILLARY
Glucose-Capillary: 139 mg/dL — ABNORMAL HIGH (ref 70–99)
Glucose-Capillary: 200 mg/dL — ABNORMAL HIGH (ref 70–99)
Glucose-Capillary: 338 mg/dL — ABNORMAL HIGH (ref 70–99)
Glucose-Capillary: 133 mg/dL — ABNORMAL HIGH (ref 70–99)
Glucose-Capillary: 118 mg/dL — ABNORMAL HIGH (ref 70–99)
Glucose-Capillary: 168 mg/dL — ABNORMAL HIGH (ref 70–99)
Glucose-Capillary: 137 mg/dL — ABNORMAL HIGH (ref 70–99)

## 2024-01-29 LAB — ACID FAST SMEAR (AFB, MYCOBACTERIA): Acid Fast Smear: NEGATIVE

## 2024-01-29 MED ORDER — LACTATED RINGERS IV SOLN: INTRAVENOUS | Status: DC

## 2024-01-29 MED ORDER — ORAL CARE MOUTH RINSE: 15.0000 mL | OROMUCOSAL | Status: DC | PRN

## 2024-01-29 MED ORDER — MIDODRINE HCL 5 MG PO TABS
5.0000 mg | ORAL_TABLET | Freq: Three times a day (TID) | ORAL | Status: DC
Start: 2024-01-29 — End: 2024-01-31
  Administered 2024-01-29 – 2024-01-31 (×8): 5 mg via ORAL
  Filled 2024-01-29 (×8): qty 1

## 2024-01-29 NOTE — TOC Progression Note (Signed)
Transition of Care [facility]) - Progression Note    Patient Details  Name: Michelle Michael MRN: [id number] Date of Birth: June 19, 1961  Transition of Care [facility]) CM/SW Contact  Isabella Bowens, [place] Phone Number: 01/29/2024, 1:34 PM  Clinical Narrative:    CSW spoke with patient at bedside and reviewed her bed offers. Patient stated that she would like to go to [facility]. CSW sent Jill Side a message regarding patient accepting of bed offer. TOC will continue to follow.    Expected Discharge Plan: Skilled Nursing Facility Barriers to Discharge: Continued Medical Work up  Expected Discharge Plan and Services In-house Referral: Clinical Social Work Discharge Planning Services: CM Consult Post Acute Care Choice: Skilled Nursing Facility Living arrangements for the past 2 months: Single Family Home                                       Social Determinants of Health (SDOH) Interventions SDOH Screenings   Food Insecurity: No Food Insecurity (01/26/2024)  Housing: Low Risk  (01/26/2024)  Transportation Needs: Unmet Transportation Needs (01/26/2024)  Utilities: Not At Risk (01/26/2024)  Alcohol Screen: Medium Risk (01/12/2024)  Depression (PHQ2-9): Low Risk  (01/25/2024)  Financial Resource Strain: Low Risk  (01/12/2024)  Physical Activity: Insufficiently Active (01/12/2024)  Social Connections: Socially Isolated (01/26/2024)  Stress: Stress Concern Present (01/12/2024)  Tobacco Use: High Risk (01/26/2024)  Health Literacy: Adequate Health Literacy (01/16/2024)    Readmission Risk Interventions    01/29/2024    1:34 PM 01/26/2024    9:20 AM  Readmission Risk Prevention Plan  Transportation Screening Complete Complete  HRI or Home Care Consult Complete Complete  Social Work Consult for Recovery Care Planning/Counseling Complete Complete  Palliative Care Screening Not Applicable Not Applicable  Medication Review Oceanographer) Complete Complete

## 2024-01-29 NOTE — Progress Notes (Signed)
PROGRESS NOTE  Michelle Michael  UJW:[id number] DOB: November 22, 1961 DOA: 01/25/2024 PCP: Billie Lade, MD    Brief Narrative:  63 y.o. female with medical history significant for COPD, type 2 diabetes mellitus, GERD, hypertension, dyslipidemia, depression and anxiety, who presented to the emergency room with acute onset of generalized weakness and chills without reported fever as well as jaundice.  She was seen by Dr. Durwin Nora her primary care physician and given her jaundice was referred to the emergency room.  She denied any chest pain however has been having occasional dyspnea and dry cough.  She admitted to nausea and vomiting earlier today.  She admits to heartburn without significant abdominal pain.  - Physical therapist recommends SNF rehab  Assessment and Plan:  1)Alcoholic Liver Cirrhosis with Ascites ---- -continue Rocephin for SBP prophylaxis in the cirrhotic patient with GI bleed -Continue Aldactone -positive asterixis and concerns for encephalopathy development -started on lactulose; responding adequately.. -Patient is not a transplant candidate due to ongoing alcohol use -Ascitic fluid from paracentesis dated 01/26/2024--negative Gram stain and cell count not consistent with infection --AFB culture from ascitic fluid pending -Ascitic fluid bacterial culture pending -Ascitic fluid cytology pending --Unfortunately patient is not ready to quit to quit drinking etoh -Planning for a total of 5 days antibiotics.  (Last dose 01/30/2024).  2) acute on chronic symptomatic anemia--concerns for melena CT abdomen and pelvis consistent with liver cirrhosis as well as suspected gastric greater curvature ulcer. -Hgb currently down to 7.4 from 9.2 -Will transfuse 1 unit of PRBC on 01/27/24 due to symptomatic anemia -EGD on 01/27/2024 shows Small hiatal hernia. Mild Schatzki ring.                           - Portal hypertensive gastropathy.                           - Non-bleeding gastric ulcer  with a clean ulcer                            base (Forrest Class III).                           - Normal duodenal bulb, first portion of the                            duodenum and second portion of the duodenum. --continue to Avoid NSAIDs -Protonix twice daily advised and encouraged  -- Outpatient colonoscopy recommended by GI  3)Persistent Leukocytosis---continue Rocephin for SBP prophylaxis Cirrhotic patient with GI bleed -Blood cultures from 01/26/24 NGTD -AFB culture from ascitic fluid pending -Ascitic fluid bacterial culture pending -Ascitic fluid cytology pending  4)Generalized weakness - This is multifactorial.  Could be partly related to her anemia, hyponatremia as well as liver cirrhosis vs infection -Physical therapy eval appreciated recommends SNF rehab  5) alcohol abuse/asterixis and elevated ammonia --- last alcoholic intake according to patient was 01/23/2024 -- Patient admits to drinking almost every day ---Unfortunately patient is not ready to quit to quit drinking etoh -Continue lorazepam per CIWA protocol along with folic acid multivitamin and thiamine -Concerning for development of encephalopathy; lactulose will be initiated -Ammonia level around 52.  Patient with positive asterixis   6) acute hypoxic respiratory failure-- -no use of accessory muscle appreciated  on exam; 2 L of colostomy mentation in place. -Continue to wean oxygen supplementation as tolerated.  7) accidental right arm dog scratches --they do not look infected -patient says dog was vaccinated and it was a scratch not bite -Patient believes that her tetanus is up-to-date -Continue close monitoring.  No superimposed infection or drainage appreciated.  8)Chronic obstructive pulmonary disease (COPD) (HCC) --Acute exacerbation, continue bronchodilators -No wheezing on exam.  9)Dyslipidemia - on Repatha. -Heart healthy diet discussed with patient.  10)Type 2 diabetes mellitus with peripheral  neuropathy (HCC) -A1c 4.9 reflecting excellent diabetic control PTA - hold home insulin until oral intake is more reliable--- patient is at risk for hypoglycemia Use Novolog/Humalog Sliding scale insulin with Accu-Cheks/Fingersticks as ordered   11)Anxiety and depression - continue BuSpar, trazodone and Cymbalta -No suicidal ideation hallucination.  12) generalized weakness -Seen by physical therapy with recommendations for a skilled nursing facility at discharge to pursued short-term rehab and conditioning.  DVT prophylaxis: SCDs Start: 01/26/24 0446    Code Status: Full Code Family Communication:  room mate is primary contact  Disposition Plan: SNF rehab Level of care: Telemetry Status is: Inpatient  Consultants:  GI   Subjective: Alert and awake, oriented x 2; no chest pain, no nausea, no vomiting.  Following commands on today's examination.  Still demonstrating generalized weakness/deconditioning, tremulousness and positive asterixis.  Objective: Vitals:   01/29/24 0732 01/29/24 1200 01/29/24 1525 01/29/24 1800  BP:   (!) 114/51   Pulse:  (!) 104  (!) 106  Resp:      Temp:   97.8 F (36.6 C)   TempSrc:   Oral   SpO2: 99%     Weight:      Height:        Intake/Output Summary (Last 24 hours) at 01/29/2024 1928 Last data filed at 01/29/2024 5784 Gross per 24 hour  Intake 261.24 ml  Output --  Net 261.24 ml   Filed Weights   01/25/24 1528  Weight: 75.2 kg   Physical Exam General exam: Alert, awake, oriented x 2; following commands appropriately and expressing feeling somewhat better.  Still weak and deconditioned.  Positive jaundice on exam.  Positive asterixis and tremulousness. Respiratory system: Good air movement bilaterally.  2 L nasal cannula in place. Cardiovascular system:RRR. No murmurs, rubs, gallops. Gastrointestinal system: Abdomen is nondistended, soft and nontender.  Positive bowel sounds appreciated. Central nervous system: No focal neurological  deficits. Extremities: No cyanosis or clubbing. Skin: No petechiae.  See images below.  Patient with right forearm scabs and changes from dog scratches present prior to admission.  No signs of superimposed infection. Psychiatry: Judgement and insight appear normal. Mood & affect appropriate.    Media Information   Document Information    Data Reviewed: I have personally reviewed following labs and imaging studies  CBC: Recent Labs  Lab 01/25/24 1635 01/26/24 0338 01/27/24 0547 01/28/24 0454 01/29/24 1019  WBC 16.8* 11.5* 16.5* 17.8* 12.3*  NEUTROABS 13.3*  --   --   --  10.1*  HGB 9.2* 8.1* 7.4* 9.2* 8.8*  HCT 27.7* 23.0* 22.0* 27.9* 26.9*  MCV 110.8* 109.0* 111.1* 105.3* 106.7*  PLT 134* 114* 109* 110* 103*   Basic Metabolic Panel: Recent Labs  Lab 01/25/24 1635 01/26/24 0338 01/26/24 1226 01/27/24 0547 01/28/24 0454 01/29/24 0450  NA 130* 130*  --  130* 132* 132*  K 3.6 3.4*  --  3.8 3.6 3.7  CL 90* 94*  --  96* 97* 98  CO2  24 27  --  25 23 26   GLUCOSE 146* 133*  --  144* 152* 161*  BUN 8 10  --  16 19 22   CREATININE 1.03* 1.08*  --  1.06* 1.26* 1.22*  CALCIUM 8.1* 7.3*  --  7.3* 7.7* 8.0*  MG  --   --  0.9* 2.0  --   --   PHOS  --   --   --   --  2.4*  --    GFR: Estimated Creatinine Clearance: 48.5 mL/min (A) (by C-G formula based on SCr of 1.22 mg/dL (H))..  Liver Function Tests: Recent Labs  Lab 01/25/24 1635 01/26/24 1226 01/27/24 0547 01/28/24 0454 01/28/24 1109 01/29/24 0450  AST 63* 51* 43*  --  46* 40  ALT 20 22 20   --  23 22  ALKPHOS 157* 130* 116  --  156* 138*  BILITOT 7.8* 8.6* 7.3*  --  5.9* 4.9*  PROT 6.6 5.7* 5.1*  --  5.4* 5.1*  ALBUMIN 2.3* 2.1* 1.9* 2.1* 2.0* 1.8*   Recent Labs  Lab 01/25/24 1635  LIPASE 34   Recent Labs  Lab 01/25/24 1635 01/28/24 1109  AMMONIA 31 52*   Coagulation Profile: Recent Labs  Lab 01/25/24 1635 01/27/24 0547 01/28/24 0454 01/29/24 1019  INR 1.6* 1.8* 1.7* 1.8*   CBG: Recent Labs   Lab 01/29/24 0325 01/29/24 0326 01/29/24 0726 01/29/24 1135 01/29/24 1713  GLUCAP 338* 200* 133* 118* 168*   Anemia Panel: No results for input(s): "VITAMINB12", "FOLATE", "FERRITIN", "TIBC", "IRON", "RETICCTPCT" in the last 72 hours.  Recent Results (from the past 240 hours)  Respiratory (~20 pathogens) panel by PCR     Status: None   Collection Time: 01/26/24 11:12 AM   Specimen: Nasopharyngeal Swab; Respiratory  Result Value Ref Range Status   Adenovirus NOT DETECTED NOT DETECTED Final   Coronavirus 229E NOT DETECTED NOT DETECTED Final    Comment: (NOTE) The Coronavirus on the Respiratory Panel, DOES NOT test for the novel  Coronavirus (2019 nCoV)    Coronavirus HKU1 NOT DETECTED NOT DETECTED Final   Coronavirus NL63 NOT DETECTED NOT DETECTED Final   Coronavirus OC43 NOT DETECTED NOT DETECTED Final   Metapneumovirus NOT DETECTED NOT DETECTED Final   Rhinovirus / Enterovirus NOT DETECTED NOT DETECTED Final   Influenza A NOT DETECTED NOT DETECTED Final   Influenza B NOT DETECTED NOT DETECTED Final   Parainfluenza Virus 1 NOT DETECTED NOT DETECTED Final   Parainfluenza Virus 2 NOT DETECTED NOT DETECTED Final   Parainfluenza Virus 3 NOT DETECTED NOT DETECTED Final   Parainfluenza Virus 4 NOT DETECTED NOT DETECTED Final   Respiratory Syncytial Virus NOT DETECTED NOT DETECTED Final   Bordetella pertussis NOT DETECTED NOT DETECTED Final   Bordetella Parapertussis NOT DETECTED NOT DETECTED Final   Chlamydophila pneumoniae NOT DETECTED NOT DETECTED Final   Mycoplasma pneumoniae NOT DETECTED NOT DETECTED Final    Comment: Performed at [facility], [address] [place], [place] [address]  Culture, blood (Routine X 2) w Reflex to ID Panel     Status: None (Preliminary result)   Collection Time: 01/26/24 12:26 PM   Specimen: BLOOD  Result Value Ref Range Status   Specimen Description   Final    BLOOD RIGHT ANTECUBITAL Performed at [facility], [address]  [place], [place] [address]    Special Requests   Final    Blood Culture adequate volume BOTTLES DRAWN AEROBIC AND ANAEROBIC Performed at [facility], [address], [place], [place] [address]    Culture   Final    NO GROWTH 3 DAYS Performed at [facility], [address] [place], [place] [address]    Report Status PENDING  Incomplete  Culture, blood (Routine X 2) w Reflex to ID Panel     Status: None (Preliminary result)   Collection Time: 01/26/24 12:26 PM   Specimen: BLOOD  Result Value Ref Range Status   Specimen Description   Final    BLOOD LEFT ANTECUBITAL Performed at [facility], [address] [place], [place] [address]    Special Requests   Final    BOTTLES DRAWN AEROBIC AND ANAEROBIC Blood Culture adequate volume Performed at [facility], [address], [place], [place] [address]    Culture   Final    NO GROWTH 3 DAYS Performed at [facility], [address], [place], [place] [address]    Report Status PENDING  Incomplete  Acid Fast Smear (AFB)     Status: None   Collection Time: 01/26/24  3:18 PM  Result Value Ref Range Status   AFB Specimen Processing Concentration  Final   Acid Fast Smear Negative  Final    Comment: (NOTE) Performed At: [facility] [address] [place], [place] [id number] Jolene Schimke MD DG:[id number]    Source (AFB) ASCITIC  Final    Comment: Performed at [facility], [address], [place], [place] [address]  Gram stain     Status: None   Collection Time: 01/26/24  3:18 PM   Specimen: Ascitic  Result Value Ref Range Status   Specimen Description ASCITIC  Final   Special Requests NONE  Final   Gram Stain   Final    PERITONEAL CYTOSPIN SMEAR NO ORGANISMS SEEN WBC PRESENT,BOTH PMN AND MONONUCLEAR Performed at [facility], [address], [place], [place] [address]    Report Status 01/26/2024 FINAL  Final  Culture, body fluid w Gram Stain-bottle     Status: None (Preliminary result)   Collection Time:  01/26/24  3:18 PM   Specimen: Ascitic  Result Value Ref Range Status   Specimen Description   Final    ASCITIC Performed at [facility], [address], [place], [place] [address]    Special Requests   Final    BOTTLES DRAWN AEROBIC AND ANAEROBIC 10CC Performed at [facility], [address], [place], [place] [address]    Culture   Final    NO GROWTH 3 DAYS Performed at [facility], [address] [place], [place] [address]    Report Status PENDING  Incomplete     Radiology Studies: No results found.  Scheduled Meds:  arformoterol  15 mcg Nebulization BID   And   umeclidinium bromide  1 puff Inhalation Daily   busPIRone  10 mg Oral TID   DULoxetine  60 mg Oral Daily   fluticasone  2 spray Each Nare Daily   folic acid  1 mg Oral Daily   gabapentin  600 mg Oral TID   insulin aspart  0-6 Units Subcutaneous Q4H   insulin aspart protamine- aspart  15 Units Subcutaneous BID WC   lactulose  20 g Oral BID   magnesium oxide  400 mg Oral Daily   midodrine  5 mg Oral TID WC   multivitamin with minerals  1 tablet Oral Daily   pantoprazole (PROTONIX) IV  40 mg Intravenous Q12H   spironolactone  100 mg Oral  Daily   thiamine  100 mg Oral Daily   Or   thiamine  100 mg Intravenous Daily   traZODone  100 mg Oral QHS   Continuous Infusions:  cefTRIAXone (ROCEPHIN)  IV 1 g (01/29/24 1739)   lactated ringers Stopped (01/29/24 0723)     LOS: 4 days   Vassie Loll, MD [facility] Available via Epic secure chat 7am-7pm After these hours, please refer to coverage provider listed on [email or web address] 01/29/2024, 7:28 PM

## 2024-01-29 NOTE — Plan of Care (Signed)
  Problem: Clinical Measurements: Goal: Ability to maintain clinical measurements within normal limits will improve Outcome: Progressing   Problem: Nutrition: Goal: Adequate nutrition will be maintained Outcome: Progressing   Problem: Coping: Goal: Level of anxiety will decrease Outcome: Progressing   Problem: Education: Goal: Knowledge of General Education information will improve Description: Including pain rating scale, medication(s)/side effects and non-pharmacologic comfort measures Outcome: Not Progressing

## 2024-01-29 NOTE — Progress Notes (Cosign Needed)
Gastroenterology Progress Note    Patient ID: Michelle Michael; [id number]; [birth year]    Subjective   No overt GI bleeding. States she sees people that aren't there. Alert and oriented X 4 currently. Has headache. No abdominal pain. Ate eggs for breakfast. No N/V. Not interested in rehab but does want to stop drinking.    Objective   Vital signs in last 24 hours Temp:  [97.7 F (36.5 C)-98 F (36.7 C)] 97.7 F (36.5 C) (02/03 0329) BP: (89-91)/(41-60) 91/60 (02/03 0329) SpO2:  [98 %-99 %] 99 % (02/03 0732) Last BM Date : 01/29/24  Physical Exam General:   Alert and oriented, pleasant, sallow complexion Abdomen:  Bowel sounds present, soft, full, non-tense ascites.  Msk:  Symmetrical without gross deformities. Normal posture. Extremities:  Without edema. Neurologic:  Alert and  oriented x4;  mild asterixis   Intake/Output from previous day: 02/02 0701 - 02/03 0700 In: 21.2 [IV Piggyback:21.2] Out: -  Intake/Output this shift: Total I/O In: 240 [P.O.:240] Out: -   Lab Results  Recent Labs    01/27/24 0547 01/28/24 0454 01/29/24 1019  WBC 16.5* 17.8* 12.3*  HGB 7.4* 9.2* 8.8*  HCT 22.0* 27.9* 26.9*  PLT 109* 110* 103*   BMET Recent Labs    01/27/24 0547 01/28/24 0454 01/29/24 0450  NA 130* 132* 132*  K 3.8 3.6 3.7  CL 96* 97* 98  CO2 25 23 26   GLUCOSE 144* 152* 161*  BUN 16 19 22   CREATININE 1.06* 1.26* 1.22*  CALCIUM 7.3* 7.7* 8.0*   LFT Recent Labs    01/26/24 1226 01/27/24 0547 01/28/24 0454 01/28/24 1109 01/29/24 0450  PROT 5.7* 5.1*  --  5.4* 5.1*  ALBUMIN 2.1* 1.9* 2.1* 2.0* 1.8*  AST 51* 43*  --  46* 40  ALT 22 20  --  23 22  ALKPHOS 130* 116  --  156* 138*  BILITOT 8.6* 7.3*  --  5.9* 4.9*  BILIDIR 3.2*  --   --  2.3*  --   IBILI 5.4*  --   --  3.6*  --    PT/INR Recent Labs    01/28/24 0454 01/29/24 1019  LABPROT 20.1* 20.7*  INR 1.7* 1.8*     Studies/Results US Paracentesis Result Date: 01/26/2024 INDICATION:  Patient with history of ETOH cirrhosis admitted with jaundice, found to have new onset ascites. Request for diagnostic and therapeutic paracentesis. EXAM: ULTRASOUND GUIDED DIAGNOSTIC AND THERAPEUTIC PARACENTESIS MEDICATIONS: 8 mL 1% lidocaine COMPLICATIONS: None immediate. PROCEDURE: Informed written consent was obtained from the patient after a discussion of the risks, benefits and alternatives to treatment. A timeout was performed prior to the initiation of the procedure. Initial ultrasound scanning demonstrates a moderate amount of ascites within the right upper abdominal quadrant. The right upper abdomen was prepped and draped in the usual sterile fashion. 1% lidocaine was used for local anesthesia. Following this, a 19 gauge, 7-cm, Yueh catheter was introduced. An ultrasound image was saved for documentation purposes. The paracentesis was performed. The catheter was removed and a dressing was applied. The patient tolerated the procedure well without immediate post procedural complication. FINDINGS: A total of approximately 2.5 L of clear yellow fluid was removed. Samples were sent to the laboratory as requested by the clinical team. IMPRESSION: Successful ultrasound-guided paracentesis yielding 2.5 liters of peritoneal fluid. Performed by Lynnette Caffey, PA-C Electronically Signed   By: Irish Lack M.D.   On: 01/26/2024 16:00   DG Chest [facility] 1 View Result  Date: 01/25/2024 CLINICAL DATA:  Hypoxia. EXAM: PORTABLE CHEST 1 VIEW COMPARISON:  06/17/2015, lung bases from abdominal CT earlier today FINDINGS: The cardiomediastinal contours are normal. The lungs are clear. Pulmonary vasculature is normal. No consolidation, pleural effusion, or pneumothorax. No acute osseous abnormalities are seen. IMPRESSION: No acute chest findings. Electronically Signed   By: Narda Rutherford M.D.   On: 01/25/2024 23:34   CT ABDOMEN PELVIS W CONTRAST Result Date: 01/25/2024 CLINICAL DATA:  Abdominal pain, acute,  nonlocalized EXAM: CT ABDOMEN AND PELVIS WITH CONTRAST TECHNIQUE: Multidetector CT imaging of the abdomen and pelvis was performed using the standard protocol following bolus administration of intravenous contrast. RADIATION DOSE REDUCTION: This exam was performed according to the departmental dose-optimization program which includes automated exposure control, adjustment of the mA and/or kV according to patient size and/or use of iterative reconstruction technique. CONTRAST:  OMNIPAQUE IOHEXOL 300 MG/ML  SOLN COMPARISON:  Ultrasound abdomen 12/15/2023 FINDINGS: Lower chest: No acute abnormality. Hepatobiliary: Nodular hepatic contour. No focal liver abnormality. Status post cholecystectomy. No biliary dilatation. Pancreas: No focal lesion. Normal pancreatic contour. No surrounding inflammatory changes. No main pancreatic ductal dilatation. Spleen: Normal in size without focal abnormality. Adrenals/Urinary Tract: No adrenal nodule bilaterally. Bilateral kidneys enhance symmetrically. Fluid dense lesion likely represents a simple renal cyst. Simple renal cysts, in the absence of clinically indicated signs/symptoms, require no independent follow-up. No hydronephrosis. No hydroureter. The urinary bladder is unremarkable. Stomach/Bowel: Question thinning and slight hazy contour of the greater curvature (2:26, 4:22). No evidence of bowel wall thickening or dilatation. Appendix appears normal. Vascular/Lymphatic: The portal, splenic, superior mesenteric veins are patent. No abdominal aorta or iliac aneurysm. Severe atherosclerotic plaque of the aorta and its branches. No abdominal, pelvic, or inguinal lymphadenopathy. Reproductive: Uterus and bilateral adnexa are unremarkable. Other: Moderate volume simple free fluid ascites. No intraperitoneal free gas. No organized fluid collection. Musculoskeletal: Diffuse subsegmental atelectasis. No suspicious lytic or blastic osseous lesions. No acute displaced fracture.  IMPRESSION: 1. Question thinning and slight hazy contour of the greater curvature. Underlying ulceration is not excluded. No bowel perforation. Consider correlation with direct visualization. 2. Cirrhosis. No focal liver lesions identified. Please note that liver protocol enhanced MR and CT are the most sensitive tests for the screening detection of hepatocellular carcinoma in the high risk setting of cirrhosis. 3. Moderate volume simple free fluid ascites. 4.  Aortic Atherosclerosis (ICD10-I70.0). Electronically Signed   By: Tish Frederickson M.D.   On: 01/25/2024 22:24    Assessment  63 y.o. female with a history of CAD, anxiety depression, diabetes, GERD, hypertension, anemia, decompensated alcohol cirrhosis who presented to [facility], ER for jaundice and weakness and found to have abnormal CT imaging of stomach concerning for PUD, acute on chronic anemia.   Acute on chronic anemia: admitting Hgb 9.2, down from 11 range several months prior, dropping to low of 7.4 and received 1 unit PRBCs with improvement to 9.2 yesterday. Hgb today 8.8 but without overt GI bleeding. EGD 2/1 with mild Schatzki ring, portal gastropathy, non-bleeding gastric ulcer, normal duodenum.   Decompensated alcoholic cirrhosis with alcoholic hepatitis: declining rehab. MELD 3.0 is 27. DF is 40.3 with PT control of 13. Tbili is slowly declining, AST improving. Asterixis noted on exam. Continue with lactulose for goal of 3 soft BMs daily. Will need to continue 5 days of IV abx for SBP prophylaxis (start 1/31, last dose tomorrow 2/4). Not a candidate for steroids in light of continued drinking and concern for underlying infection (Blood cultures  negative thus far, SBP negative). Overall poor prognosis if continues to drink.   Plan / Recommendations  PPI IV BID Continue lactulose Daily MELD labs Absolute alcohol cessation Outpatient colonoscopy, surveillance EGD Continue IV abx for SBP prophylaxis with day 5 tomorrow Could  consider repeat para prior to discharge if clinically indicated     LOS: 4 days    01/29/2024, 11:34 AM  Gelene Mink, PhD, ANP-BC [facility]

## 2024-01-29 NOTE — Plan of Care (Signed)

## 2024-01-30 DIAGNOSIS — K254 Chronic or unspecified gastric ulcer with hemorrhage: Secondary | ICD-10-CM | POA: Diagnosis not present

## 2024-01-30 DIAGNOSIS — G9340 Encephalopathy, unspecified: Secondary | ICD-10-CM | POA: Diagnosis not present

## 2024-01-30 DIAGNOSIS — K766 Portal hypertension: Secondary | ICD-10-CM

## 2024-01-30 DIAGNOSIS — K259 Gastric ulcer, unspecified as acute or chronic, without hemorrhage or perforation: Secondary | ICD-10-CM

## 2024-01-30 DIAGNOSIS — K7290 Hepatic failure, unspecified without coma: Secondary | ICD-10-CM | POA: Diagnosis not present

## 2024-01-30 DIAGNOSIS — K7031 Alcoholic cirrhosis of liver with ascites: Secondary | ICD-10-CM | POA: Diagnosis not present

## 2024-01-30 DIAGNOSIS — K7011 Alcoholic hepatitis with ascites: Secondary | ICD-10-CM

## 2024-01-30 DIAGNOSIS — D509 Iron deficiency anemia, unspecified: Secondary | ICD-10-CM | POA: Diagnosis not present

## 2024-01-30 DIAGNOSIS — K7682 Hepatic encephalopathy: Secondary | ICD-10-CM

## 2024-01-30 DIAGNOSIS — F10939 Alcohol use, unspecified with withdrawal, unspecified: Secondary | ICD-10-CM | POA: Diagnosis not present

## 2024-01-30 LAB — CBC WITH DIFFERENTIAL/PLATELET
Abs Immature Granulocytes: 0.08 10*3/uL — ABNORMAL HIGH (ref 0.00–0.07)
Basophils Absolute: 0.1 10*3/uL (ref 0.0–0.1)
Basophils Relative: 0 %
Eosinophils Absolute: 0.1 10*3/uL (ref 0.0–0.5)
Eosinophils Relative: 0 %
HCT: 27.3 % — ABNORMAL LOW (ref 36.0–46.0)
Hemoglobin: 8.9 g/dL — ABNORMAL LOW (ref 12.0–15.0)
Immature Granulocytes: 1 %
Lymphocytes Relative: 10 %
Lymphs Abs: 1.3 10*3/uL (ref 0.7–4.0)
MCH: 35 pg — ABNORMAL HIGH (ref 26.0–34.0)
MCHC: 32.6 g/dL (ref 30.0–36.0)
MCV: 107.5 fL — ABNORMAL HIGH (ref 80.0–100.0)
Monocytes Absolute: 1.6 10*3/uL — ABNORMAL HIGH (ref 0.1–1.0)
Monocytes Relative: 12 %
Neutro Abs: 10.5 10*3/uL — ABNORMAL HIGH (ref 1.7–7.7)
Neutrophils Relative %: 77 %
Platelets: 108 10*3/uL — ABNORMAL LOW (ref 150–400)
RBC: 2.54 MIL/uL — ABNORMAL LOW (ref 3.87–5.11)
RDW: 21.2 % — ABNORMAL HIGH (ref 11.5–15.5)
Smear Review: DECREASED
WBC: 13.6 10*3/uL — ABNORMAL HIGH (ref 4.0–10.5)
nRBC: 0 % (ref 0.0–0.2)

## 2024-01-30 LAB — AMMONIA: Ammonia: 46 umol/L — ABNORMAL HIGH (ref 9–35)

## 2024-01-30 LAB — GLUCOSE, CAPILLARY
Glucose-Capillary: 167 mg/dL — ABNORMAL HIGH (ref 70–99)
Glucose-Capillary: 170 mg/dL — ABNORMAL HIGH (ref 70–99)
Glucose-Capillary: 133 mg/dL — ABNORMAL HIGH (ref 70–99)
Glucose-Capillary: 148 mg/dL — ABNORMAL HIGH (ref 70–99)
Glucose-Capillary: 133 mg/dL — ABNORMAL HIGH (ref 70–99)
Glucose-Capillary: 154 mg/dL — ABNORMAL HIGH (ref 70–99)

## 2024-01-30 LAB — COMPREHENSIVE METABOLIC PANEL
ALT: 24 U/L (ref 0–44)
AST: 52 U/L — ABNORMAL HIGH (ref 15–41)
Albumin: 1.9 g/dL — ABNORMAL LOW (ref 3.5–5.0)
Alkaline Phosphatase: 148 U/L — ABNORMAL HIGH (ref 38–126)
Anion gap: 9 (ref 5–15)
BUN: 23 mg/dL (ref 8–23)
CO2: 27 mmol/L (ref 22–32)
Calcium: 8.5 mg/dL — ABNORMAL LOW (ref 8.9–10.3)
Chloride: 98 mmol/L (ref 98–111)
Creatinine, Ser: 1.04 mg/dL — ABNORMAL HIGH (ref 0.44–1.00)
GFR, Estimated: >60 mL/min (ref >60)
Glucose, Bld: 173 mg/dL — ABNORMAL HIGH (ref 70–99)
Potassium: 3.8 mmol/L (ref 3.5–5.1)
Sodium: 134 mmol/L — ABNORMAL LOW (ref 135–145)
Total Bilirubin: 4.6 mg/dL — ABNORMAL HIGH (ref 0.0–1.2)
Total Protein: 5.3 g/dL — ABNORMAL LOW (ref 6.5–8.1)

## 2024-01-30 LAB — PROTIME-INR
INR: 1.8 — ABNORMAL HIGH (ref 0.8–1.2)
Prothrombin Time: 20.7 s — ABNORMAL HIGH (ref 11.4–15.2)

## 2024-01-30 MED ORDER — GABAPENTIN 300 MG PO CAPS
300.0000 mg | ORAL_CAPSULE | Freq: Three times a day (TID) | ORAL | Status: DC
  Administered 2024-01-30 – 2024-01-31 (×4): 300 mg via ORAL
  Filled 2024-01-30 (×5): qty 1

## 2024-01-30 MED ORDER — RIFAXIMIN 550 MG PO TABS
550.0000 mg | ORAL_TABLET | Freq: Two times a day (BID) | ORAL | Status: DC
  Administered 2024-01-30 – 2024-02-01 (×5): 550 mg via ORAL
  Filled 2024-01-30 (×8): qty 1

## 2024-01-30 MED ORDER — BUDESONIDE 0.5 MG/2ML IN SUSP
0.5000 mg | Freq: Two times a day (BID) | RESPIRATORY_TRACT | Status: DC
  Administered 2024-01-30 – 2024-02-02 (×6): 0.5 mg via RESPIRATORY_TRACT
  Filled 2024-01-30 (×6): qty 2

## 2024-01-30 MED ORDER — FUROSEMIDE 40 MG PO TABS
40.0000 mg | ORAL_TABLET | Freq: Every day | ORAL | Status: DC
  Administered 2024-01-30 – 2024-01-31 (×2): 40 mg via ORAL
  Filled 2024-01-30 (×3): qty 1

## 2024-01-30 MED ORDER — BUSPIRONE HCL 5 MG PO TABS
7.5000 mg | ORAL_TABLET | Freq: Three times a day (TID) | ORAL | Status: DC
  Administered 2024-01-30 – 2024-01-31 (×4): 7.5 mg via ORAL
  Filled 2024-01-30 (×5): qty 2

## 2024-01-30 MED ORDER — ARFORMOTEROL TARTRATE 15 MCG/2ML IN NEBU
15.0000 ug | INHALATION_SOLUTION | Freq: Two times a day (BID) | RESPIRATORY_TRACT | Status: DC
  Administered 2024-01-30 – 2024-02-02 (×5): 15 ug via RESPIRATORY_TRACT
  Filled 2024-01-30 (×5): qty 2

## 2024-01-30 NOTE — Progress Notes (Signed)
 Subjective: Feels more confused today. Taking longer to answer/respond to orientation questions, but is able to answer all of them.  Denies abdominal distention, abdominal pain, nausea, or vomiting, brbpr or melena.  Regarding outpatient diuretic regimen, reports he was taking spironolactone  daily but Lasix  more like every other day.  Was taking ibuprofen  about once a week, goody powders about 3 times a week for headaches.   Had been drinking 2, 16 oz glasses of wine 4-5 times a day. States she is going to stop drinking alcohol , but not interested in rehab. Roommate is at bedside stating he is the only source of alcohol  coming into the house as the patient doesn't leave the house. States they have had discussions since admission and patient has asked him to remove all alcohol  and bring no more into the home. Roommate is concerned about patient's isolation as she sits at home all day alone while he is working and he is her only interaction. Wants to get her involved in something or have someone come by a few times a week. Wants to get social work involved to see if they can help.   Objective: Vital signs in last 24 hours: Temp:  [97.8 F (36.6 C)-98.2 F (36.8 C)] 98.2 F (36.8 C) (02/04 0404) Pulse Rate:  [104-106] 106 (02/03 1800) BP: (101-114)/(47-56) 107/56 (02/04 0404) SpO2:  [98 %] 98 % (02/03 2005) Last BM Date : 01/29/24 General:   Chronically ill-appearing, jaundiced, no acute distress. Head:  Normocephalic and atraumatic. Eyes:  + Scleral icterus Abdomen:  Bowel sounds present, full but soft and nontender. No rebound or guarding. No masses appreciated  Extremities:  With trace ankle edema. Neurologic:  Alert.  Oriented x 4, but takes a long time to answer orientation questions.  Arms are tremulous with asterixis. Legs also with intermittent tremor.  Psych:  Normal mood and affect.  Intake/Output from previous day: 02/03 0701 - 02/04 0700 In: 340 [P.O.:240; IV  Piggyback:100] Out: -  Intake/Output this shift: No intake/output data recorded.  Lab Results: Recent Labs    01/28/24 0454 01/29/24 1019 01/30/24 0429  WBC 17.8* 12.3* 13.6*  HGB 9.2* 8.8* 8.9*  HCT 27.9* 26.9* 27.3*  PLT 110* 103* 108*   BMET Recent Labs    01/28/24 0454 01/29/24 0450 01/30/24 0429  NA 132* 132* 134*  K 3.6 3.7 3.8  CL 97* 98 98  CO2 23 26 27   GLUCOSE 152* 161* 173*  BUN 19 22 23   CREATININE 1.26* 1.22* 1.04*  CALCIUM  7.7* 8.0* 8.5*   LFT Recent Labs    01/28/24 1109 01/29/24 0450 01/30/24 0429  PROT 5.4* 5.1* 5.3*  ALBUMIN  2.0* 1.8* 1.9*  AST 46* 40 52*  ALT 23 22 24   ALKPHOS 156* 138* 148*  BILITOT 5.9* 4.9* 4.6*  BILIDIR 2.3*  --   --   IBILI 3.6*  --   --    PT/INR Recent Labs    01/29/24 1019 01/30/24 0429  LABPROT 20.7* 20.7*  INR 1.8* 1.8*     Assessment: 63 y.o. female with a history of CAD, anxiety depression, diabetes, GERD, hypertension, anemia, decompensated alcohol  cirrhosis who presented to [facility], ER for jaundice and weakness and found to have acute on chronic anemia with abdominal CT imaging of stomach concerning for PUD.   Acute on chronic anemia:  - In the setting of folate deficiency, portal gastropathy, and PUD, likely secondary to alcohol  and NSAID use.   - Admitting Hgb 9.2, down from  11 range several months prior, dropping to low of 7.4 and received 1 unit PRBCs with Hgb improved to 9.2 on 2/2, down to 8.8 on 2/3 and is stable at 8.9 today.  - She has denied overt GI bleeding.  - EGD 2/1 with  mild Schatzki ring, portal gastropathy, non-bleeding gastric ulcer, normal duodenum. - Currently on PPI BID and folic acid .  - Also receiving Rocephin  for SBP prophylaxis, last dose should be today to complete 5 day course.   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  - DF is 40 today (down from 40.3 yesterday) with PT control of 13.  - Tbili is slowly  declining, PT stable today compared to yesterday.  AST and alk phos up slightly more today.  - Not a candidate for steroids in light of continued drinking and concern for underlying infection in light of leukocytosis on admission (Blood cultures negative thus far, SBP negative, acute hepatitis panel negative). - Overall poor prognosis if patient continues to drink. Unfortunately, she has declined rehab, but reports she is planning to stop drinking.   Encephalopathy:  Multifactorial in the setting of hepatic encephalopathy and alcohol  withdrawal. Ammonia mildly elevated at 52 on 2/2. Currently on lactulose  20g BID and having adequate bowel movements, but does report worsening confusion today.  A&O x 4, but requires extended period of time to respond to orientation questions.  Asterixis present, but also with generalized tremulousness.  Will recheck ammonia see if this is related to HE vs alcohol  withdrawal. May need to add Xifaxan .   Portal hypertension with ascites:  S/P paracentesis 01/26/24 with 2.5 L removed. Negative for SBP. She is on spironolactone  100 mg daily. Was prescribed Lasix  40 mg twice daily outpatient, but patient reports she was only taking this about every other day.  Lasix  has been on hold since 1/31, possibly due to electrolyte abnormalities which have improved.  Would recommend resuming Lasix  starting at a low dose of 20 mg daily with close monitoring of electrolytes and kidney function and efforts to prevent recurrent ascites/peripheral edema.    Plan: Continue IV PPI twice daily while inpatient.  Transition to p.o. at discharge. Continue lactulose  with goal of 3-4 bowel movements per day. Recheck ammonia today.  If elevated, will add Xifaxan . Complete 5-day course of Rocephin  today. Continue spironolactone  100 mg daily. Start Lasix  40 mg daily.  CBC, CMP, INR daily. Continue thiamine .  Continue folic acid .  Needs absolute alcohol  cessation. Outpatient colonoscopy,  surveillance EGD.     LOS: 5 days    01/30/2024, 8:16 AM   [facility], [facility]

## 2024-01-30 NOTE — Plan of Care (Signed)

## 2024-01-30 NOTE — Progress Notes (Signed)
 Physical Therapy Treatment Patient Details Name: Michelle Michael MRN: [id number] DOB: Dec 12, 1961 Today's Date: 01/30/2024   History of Present Illness Michelle Michael is a 63 y.o. female with medical history significant for COPD, type 2 diabetes mellitus, GERD, hypertension, dyslipidemia, depression and anxiety, who presented to the emergency room with acute onset of generalized weakness and chills without reported fever as well as jaundice.  She was seen by Dr. Melvenia her primary care physician and given her jaundice was referred to the emergency room.  She denied any chest pain however has been having occasional dyspnea and dry cough.  She admitted to nausea and vomiting earlier today.  She admits to heartburn without significant abdominal pain.  No melena or bright red bleeding per rectum.  No bilious vomitus or hematemesis.  No other bleeding diathesis.    PT Comments  Patient was agreeable to therapy. Patient performed all tasks with min/mod assist needed from PT. Patient was able to maintain balance on EOB while performing exercises. Using RW patient performed bed to chair transfer. Ambulation was completed using RW. During ambulation patients SpO2 dropped to 65-70% while on 2L of O2. Ambulation was discontinued and O2 was placed at 3L. Once SpO2 leveled back out to 92% O2 was lowered back to 2L and patient was able to maintain SpO2 at 92%. Patient was left in chair at conclusion of session. Nurse was informed of SpO2 and O2 changes following session. Patient will benefit from continued skilled physical therapy in hospital and recommended venue below to increase strength, balance, endurance for safe ADLs and gait.      If plan is discharge home, recommend the following: A little help with walking and/or transfers;Help with stairs or ramp for entrance;Assistance with cooking/housework;A little help with bathing/dressing/bathroom   Can travel by private vehicle        Equipment  Recommendations  None recommended by PT    Recommendations for Other Services       Precautions / Restrictions Precautions Precautions: Fall Restrictions Weight Bearing Restrictions Per Provider Order: No     Mobility  Bed Mobility Overal bed mobility: Needs Assistance Bed Mobility: Supine to Sit     Supine to sit: Min assist, Mod assist       Patient Response: Cooperative  Transfers Overall transfer level: Needs assistance Equipment used: Rolling walker (2 wheels) Transfers: Sit to/from Stand, Bed to chair/wheelchair/BSC Sit to Stand: Mod assist   Step pivot transfers: Mod assist, Min assist            Ambulation/Gait Ambulation/Gait assistance: Mod assist Gait Distance (Feet): 25 Feet Assistive device: Rolling walker (2 wheels) Gait Pattern/deviations: Step-to pattern, Decreased step length - right, Decreased step length - left, Decreased stride length Gait velocity: slow     General Gait Details: slow, labored movement, heavy reliance on RW, limited due to SpO2 dropping   Stairs             Wheelchair Mobility     Tilt Bed Tilt Bed Patient Response: Cooperative  Modified Rankin (Stroke Patients Only)       Balance Overall balance assessment: Needs assistance Sitting-balance support: Bilateral upper extremity supported, Feet unsupported Sitting balance-Leahy Scale: Fair Sitting balance - Comments: fair/good   Standing balance support: Bilateral upper extremity supported, Reliant on assistive device for balance Standing balance-Leahy Scale: Fair  Cognition Arousal: Alert Behavior During Therapy: WFL for tasks assessed/performed Overall Cognitive Status: Within Functional Limits for tasks assessed                                          Exercises General Exercises - Lower Extremity Ankle Circles/Pumps: Seated, AROM, Both, 10 reps Long Arc Quad: Seated, AROM, Both, 10  reps Hip Flexion/Marching: Seated, AROM, Both, 10 reps    General Comments        Pertinent Vitals/Pain Pain Assessment Pain Assessment: No/denies pain    Home Living                          Prior Function            PT Goals (current goals can now be found in the care plan section) Acute Rehab PT Goals Patient Stated Goal: to return home PT Goal Formulation: With patient Time For Goal Achievement: 02/09/24 Potential to Achieve Goals: Good Progress towards PT goals: Progressing toward goals    Frequency    Min 3X/week      PT Plan      Co-evaluation              AM-PAC PT 6 Clicks Mobility   Outcome Measure  Help needed turning from your back to your side while in a flat bed without using bedrails?: A Little Help needed moving from lying on your back to sitting on the side of a flat bed without using bedrails?: A Little Help needed moving to and from a bed to a chair (including a wheelchair)?: A Lot Help needed standing up from a chair using your arms (e.g., wheelchair or bedside chair)?: A Lot Help needed to walk in hospital room?: A Little Help needed climbing 3-5 steps with a railing? : Total 6 Click Score: 14    End of Session   Activity Tolerance: Patient tolerated treatment well Patient left: in chair;with call bell/phone within reach Nurse Communication: Mobility status PT Visit Diagnosis: Unsteadiness on feet (R26.81);Other abnormalities of gait and mobility (R26.89);Muscle weakness (generalized) (M62.81)     Time: 8588-8565 PT Time Calculation (min) (ACUTE ONLY): 23 min  Charges:    $Therapeutic Exercise: 8-22 mins $Therapeutic Activity: 8-22 mins PT General Charges $$ ACUTE PT VISIT: 1 Visit                     Sindy Mccune SPT

## 2024-01-30 NOTE — Plan of Care (Signed)
  Problem: Clinical Measurements: Goal: Ability to maintain clinical measurements within normal limits will improve Outcome: Progressing   Problem: Health Behavior/Discharge Planning: Goal: Ability to manage health-related needs will improve Outcome: Not Progressing   Problem: Education: Goal: Knowledge of General Education information will improve Description: Including pain rating scale, medication(s)/side effects and non-pharmacologic comfort measures Outcome: Not Progressing

## 2024-01-30 NOTE — TOC Progression Note (Signed)
 Transition of Care [facility]) - Progression Note    Patient Details  Name: Michelle Michael MRN: [id number] Date of Birth: September 01, 1961  [facility]) CM/SW Contact  Sharlyne Stabs, RN Phone Number: 01/30/2024, 4:13 PM  Clinical Narrative:    Insurance authorization received approved 2/4 - 2/6 mayme Barrows [id number]    Expected Discharge Plan: Skilled Nursing Facility Barriers to Discharge: Continued Medical Work up  Expected Discharge Plan and Services In-house Referral: Clinical Social Work Discharge Planning Services: CM Consult Post Acute Care Choice: Skilled Nursing Facility Living arrangements for the past 2 months: Single Family Home        Social Determinants of Health (SDOH) Interventions SDOH Screenings   Food Insecurity: No Food Insecurity (01/26/2024)  Housing: Low Risk  (01/26/2024)  Transportation Needs: Unmet Transportation Needs (01/26/2024)  Utilities: Not At Risk (01/26/2024)  Alcohol  Screen: Medium Risk (01/12/2024)  Depression (PHQ2-9): Low Risk  (01/25/2024)  Financial Resource Strain: Low Risk  (01/12/2024)  Physical Activity: Insufficiently Active (01/12/2024)  Social Connections: Socially Isolated (01/26/2024)  Stress: Stress Concern Present (01/12/2024)  Tobacco Use: High Risk (01/26/2024)  Health Literacy: Adequate Health Literacy (01/16/2024)    Readmission Risk Interventions    01/29/2024    1:34 PM 01/26/2024    9:20 AM  Readmission Risk Prevention Plan  Transportation Screening Complete Complete  HRI or Home Care Consult Complete Complete  Social Work Consult for Recovery Care Planning/Counseling Complete Complete  Palliative Care Screening Not Applicable Not Applicable  Medication Review Oceanographer) Complete Complete

## 2024-01-30 NOTE — TOC Progression Note (Signed)
 Transition of Care [facility]) - Progression Note    Patient Details  Name: Michelle Michael MRN: [id number] Date of Birth: [birth year]  Transition of Care [facility]) CM/SW Contact  Sharlyne Stabs, RN Phone Number: 01/30/2024, 11:25 AM  Clinical Narrative:   MD requested for TOC to start INS Auth today. CMA starting Auth. Patient has a bed offer at [facility].   Expected Discharge Plan: Skilled Nursing Facility Barriers to Discharge: Continued Medical Work up  Expected Discharge Plan and Services In-house Referral: Clinical Social Work Discharge Planning Services: CM Consult Post Acute Care Choice: Skilled Nursing Facility Living arrangements for the past 2 months: Single Family Home                   Social Determinants of Health (SDOH) Interventions SDOH Screenings   Food Insecurity: No Food Insecurity (01/26/2024)  Housing: Low Risk  (01/26/2024)  Transportation Needs: Unmet Transportation Needs (01/26/2024)  Utilities: Not At Risk (01/26/2024)  Alcohol  Screen: Medium Risk (01/12/2024)  Depression (PHQ2-9): Low Risk  (01/25/2024)  Financial Resource Strain: Low Risk  (01/12/2024)  Physical Activity: Insufficiently Active (01/12/2024)  Social Connections: Socially Isolated (01/26/2024)  Stress: Stress Concern Present (01/12/2024)  Tobacco Use: High Risk (01/26/2024)  Health Literacy: Adequate Health Literacy (01/16/2024)    Readmission Risk Interventions    01/29/2024    1:34 PM 01/26/2024    9:20 AM  Readmission Risk Prevention Plan  Transportation Screening Complete Complete  HRI or Home Care Consult Complete Complete  Social Work Consult for Recovery Care Planning/Counseling Complete Complete  Palliative Care Screening Not Applicable Not Applicable  Medication Review Oceanographer) Complete Complete

## 2024-01-30 NOTE — Progress Notes (Signed)
 PROGRESS NOTE  Michelle Michael  FMW:[id number] DOB: 31-Oct-1961 DOA: 01/25/2024 PCP: Melvenia Manus BRAVO, MD    Brief Narrative:  63 y.o. female with medical history significant for COPD, type 2 diabetes mellitus, GERD, hypertension, dyslipidemia, depression and anxiety, who presented to the emergency room with acute onset of generalized weakness and chills without reported fever as well as jaundice.  She was seen by Dr. Melvenia her primary care physician and given her jaundice was referred to the emergency room.  She denied any chest pain however has been having occasional dyspnea and dry cough.  She admitted to nausea and vomiting earlier today.  She admits to heartburn without significant abdominal pain.  - Physical therapist recommends SNF rehab  Assessment and Plan:  1)Alcoholic Liver Cirrhosis with Ascites ---- -continue Rocephin  for SBP prophylaxis in the cirrhotic patient with GI bleed -Continue Aldactone  -positive asterixis and concerns for encephalopathy development -started on lactulose ; responding adequately.. -Patient is not a transplant candidate due to ongoing alcohol  use -Ascitic fluid from paracentesis dated 01/26/2024--negative Gram stain and cell count not consistent with infection --AFB culture from ascitic fluid pending -Ascitic fluid bacterial culture pending -Ascitic fluid cytology pending --Unfortunately patient is not ready to quit to quit drinking etoh -Planning for a total of 5 days antibiotics.  (Last dose today 01/30/2024).  2) acute on chronic symptomatic anemia--concerns for melena CT abdomen and pelvis consistent with liver cirrhosis as well as suspected gastric greater curvature ulcer. -Hgb currently down to 7.4 from 9.2 -Will transfuse 1 unit of PRBC on 01/27/24 due to symptomatic anemia -EGD on 01/27/2024 shows Small hiatal hernia. Mild Schatzki ring.                           - Portal hypertensive gastropathy.                           - Non-bleeding gastric  ulcer with a clean ulcer                            base (Forrest Class III).                           - Normal duodenal bulb, first portion of the                            duodenum and second portion of the duodenum. --continue to Avoid NSAIDs -Protonix  twice daily advised and encouraged  -- Outpatient colonoscopy recommended by GI  3)Persistent Leukocytosis---continue Rocephin  for SBP prophylaxis Cirrhotic patient with GI bleed -Blood cultures from 01/26/24 NGTD -AFB culture from ascitic fluid pending -Ascitic fluid bacterial culture pending -Ascitic fluid cytology pending  4)Generalized weakness - This is multifactorial.  Could be partly related to her anemia, hyponatremia as well as liver cirrhosis vs infection -Physical therapy eval appreciated recommends SNF rehab  5) alcohol  abuse/asterixis and elevated ammonia --- last alcoholic intake according to patient was 01/23/2024 -- Patient admits to drinking almost every day ---Unfortunately patient is not ready to quit to quit drinking etoh -Continue lorazepam  per CIWA protocol along with folic acid  multivitamin and thiamine  -Concerning for development of encephalopathy; lactulose  will be initiated -Ammonia level around 52.  Patient with positive asterixis   6) acute hypoxic respiratory failure-- -no use of accessory muscle  appreciated on exam; 2 L nasal cannula in place -Continue nebulizer management and as needed bronchodilator. -Continue to wean oxygen supplementation as tolerated.  7) accidental right arm dog scratches --they do not look infected -patient says dog was vaccinated and it was a scratch not bite -Patient believes that her tetanus is up-to-date -Continue close monitoring.  No superimposed infection or drainage appreciated.  8)Chronic obstructive pulmonary disease (COPD) (HCC) -Mild expiratory wheezing or rhonchi appreciated -2 L of cannula in place -Continue to wean off as tolerated -Will continue nebulizer  management and supportive care.  9)Dyslipidemia - on Repatha . -Heart healthy diet discussed with patient.  10)Type 2 diabetes mellitus with peripheral neuropathy (HCC) -A1c 4.9 reflecting excellent diabetic control PTA - hold home insulin  until oral intake is more reliable--- patient is at risk for hypoglycemia Use Novolog /Humalog Sliding scale insulin  with Accu-Cheks/Fingersticks as ordered   11)Anxiety and depression - continue BuSpar , trazodone  and Cymbalta  -No suicidal ideation hallucination. -Dosages have been adjusted to minimize oversedation.  12) generalized weakness -Seen by physical therapy with recommendations for a skilled nursing facility at discharge to pursued short-term rehab and conditioning.  DVT prophylaxis: SCDs Start: 01/26/24 0446    Code Status: Full Code Family Communication:  room mate is primary contact  Disposition Plan: SNF rehab Level of care: Telemetry Status is: Inpatient  Consultants:  GI   Subjective: No fever, alert/awake and oriented x 2.  Following commands appropriately.  No melena or hematochezia.  Generally weak and deconditioned.  Objective: Vitals:   01/30/24 0404 01/30/24 0846 01/30/24 1200 01/30/24 1511  BP: (!) 107/56   (!) 105/43  Pulse:   (!) 107   Resp:      Temp: 98.2 F (36.8 C)   97.9 F (36.6 C)  TempSrc: Axillary   Oral  SpO2:  97%    Weight:      Height:        Intake/Output Summary (Last 24 hours) at 01/30/2024 1811 Last data filed at 01/30/2024 0243 Gross per 24 hour  Intake 100 ml  Output --  Net 100 ml   Filed Weights   01/25/24 1528  Weight: 75.2 kg   Physical Exam General exam: Alert, awake, oriented x 2; but nursing staff intermittently restless and confused.  Positive asterixis appreciated on exam. Respiratory system: Posterior rhonchi and expiratory wheezing; 2 L nasal cannula in place. Cardiovascular system: Rate controlled, no rubs, no gallops, no JVD. Gastrointestinal system: Abdomen is  mildly distended; soft, nontender to palpation and demonstrating positive bowel sounds.  Central nervous system: No focal neurological deficits. Extremities: No cyanosis or clubbing. Skin: No petechiae.  Right forearm scabs from recent dog scratches healing appropriately and not demonstrating any drainage. Psychiatry: Judgement and insight appear normal. Mood & affect appropriate.   Data Reviewed: I have personally reviewed following labs and imaging studies  CBC: Recent Labs  Lab 01/25/24 1635 01/26/24 0338 01/27/24 0547 01/28/24 0454 01/29/24 1019 01/30/24 0429  WBC 16.8* 11.5* 16.5* 17.8* 12.3* 13.6*  NEUTROABS 13.3*  --   --   --  10.1* 10.5*  HGB 9.2* 8.1* 7.4* 9.2* 8.8* 8.9*  HCT 27.7* 23.0* 22.0* 27.9* 26.9* 27.3*  MCV 110.8* 109.0* 111.1* 105.3* 106.7* 107.5*  PLT 134* 114* 109* 110* 103* 108*   Basic Metabolic Panel: Recent Labs  Lab 01/26/24 0338 01/26/24 1226 01/27/24 0547 01/28/24 0454 01/29/24 0450 01/30/24 0429  NA 130*  --  130* 132* 132* 134*  K 3.4*  --  3.8 3.6 3.7 3.8  CL 94*  --  96* 97* 98 98  CO2 27  --  25 23 26 27   GLUCOSE 133*  --  144* 152* 161* 173*  BUN 10  --  16 19 22 23   CREATININE 1.08*  --  1.06* 1.26* 1.22* 1.04*  CALCIUM  7.3*  --  7.3* 7.7* 8.0* 8.5*  MG  --  0.9* 2.0  --   --   --   PHOS  --   --   --  2.4*  --   --    GFR: Estimated Creatinine Clearance: 56.9 mL/min (A) (by C-G formula based on SCr of 1.04 mg/dL (H))..  Liver Function Tests: Recent Labs  Lab 01/26/24 1226 01/27/24 0547 01/28/24 0454 01/28/24 1109 01/29/24 0450 01/30/24 0429  AST 51* 43*  --  46* 40 52*  ALT 22 20  --  23 22 24   ALKPHOS 130* 116  --  156* 138* 148*  BILITOT 8.6* 7.3*  --  5.9* 4.9* 4.6*  PROT 5.7* 5.1*  --  5.4* 5.1* 5.3*  ALBUMIN  2.1* 1.9* 2.1* 2.0* 1.8* 1.9*   Recent Labs  Lab 01/25/24 1635  LIPASE 34   Recent Labs  Lab 01/25/24 1635 01/28/24 1109 01/30/24 0921  AMMONIA 31 52* 46*   Coagulation Profile: Recent Labs  Lab  01/25/24 1635 01/27/24 0547 01/28/24 0454 01/29/24 1019 01/30/24 0429  INR 1.6* 1.8* 1.7* 1.8* 1.8*   CBG: Recent Labs  Lab 01/30/24 0108 01/30/24 0403 01/30/24 0731 01/30/24 1132 01/30/24 1644  GLUCAP 167* 170* 133* 148* 133*   Anemia Panel: No results for input(s): VITAMINB12, FOLATE, FERRITIN, TIBC, IRON, RETICCTPCT in the last 72 hours.  Recent Results (from the past 240 hours)  Respiratory (~20 pathogens) panel by PCR     Status: None   Collection Time: 01/26/24 11:12 AM   Specimen: Nasopharyngeal Swab; Respiratory  Result Value Ref Range Status   Adenovirus NOT DETECTED NOT DETECTED Final   Coronavirus 229E NOT DETECTED NOT DETECTED Final    Comment: (NOTE) The Coronavirus on the Respiratory Panel, DOES NOT test for the novel  Coronavirus (2019 nCoV)    Coronavirus HKU1 NOT DETECTED NOT DETECTED Final   Coronavirus NL63 NOT DETECTED NOT DETECTED Final   Coronavirus OC43 NOT DETECTED NOT DETECTED Final   Metapneumovirus NOT DETECTED NOT DETECTED Final   Rhinovirus / Enterovirus NOT DETECTED NOT DETECTED Final   Influenza A NOT DETECTED NOT DETECTED Final   Influenza B NOT DETECTED NOT DETECTED Final   Parainfluenza Virus 1 NOT DETECTED NOT DETECTED Final   Parainfluenza Virus 2 NOT DETECTED NOT DETECTED Final   Parainfluenza Virus 3 NOT DETECTED NOT DETECTED Final   Parainfluenza Virus 4 NOT DETECTED NOT DETECTED Final   Respiratory Syncytial Virus NOT DETECTED NOT DETECTED Final   Bordetella pertussis NOT DETECTED NOT DETECTED Final   Bordetella Parapertussis NOT DETECTED NOT DETECTED Final   Chlamydophila pneumoniae NOT DETECTED NOT DETECTED Final   Mycoplasma pneumoniae NOT DETECTED NOT DETECTED Final    Comment: Performed at [facility], [address] [place], [place] [address]  Culture, blood (Routine X 2) w Reflex to ID Panel     Status: None (Preliminary result)   Collection Time: 01/26/24 12:26 PM   Specimen: BLOOD  Result Value  Ref Range Status   Specimen Description BLOOD RIGHT ANTECUBITAL  Final   Special Requests   Final    Blood Culture adequate volume BOTTLES DRAWN AEROBIC AND ANAEROBIC   Culture  Final    NO GROWTH 4 DAYS Performed at [facility], [address], [place], [place] [address]    Report Status PENDING  Incomplete  Culture, blood (Routine X 2) w Reflex to ID Panel     Status: None (Preliminary result)   Collection Time: 01/26/24 12:26 PM   Specimen: BLOOD  Result Value Ref Range Status   Specimen Description BLOOD LEFT ANTECUBITAL  Final   Special Requests   Final    BOTTLES DRAWN AEROBIC AND ANAEROBIC Blood Culture adequate volume   Culture   Final    NO GROWTH 4 DAYS Performed at [facility], [address] [place], [place] [address]    Report Status PENDING  Incomplete  Acid Fast Smear (AFB)     Status: None   Collection Time: 01/26/24  3:18 PM  Result Value Ref Range Status   AFB Specimen Processing Concentration  Final   Acid Fast Smear Negative  Final    Comment: (NOTE) Performed At: [facility] [address] [place], [place] [id number] Jennette Shorter MD Ey:[id number]    Source (AFB) ASCITIC  Final    Comment: Performed at [facility], [address], [place], [place] [address]  Gram stain     Status: None   Collection Time: 01/26/24  3:18 PM   Specimen: Ascitic  Result Value Ref Range Status   Specimen Description ASCITIC  Final   Special Requests NONE  Final   Gram Stain   Final    PERITONEAL CYTOSPIN SMEAR NO ORGANISMS SEEN WBC PRESENT,BOTH PMN AND MONONUCLEAR Performed at [facility], [address] [place], [place] [address]    Report Status 01/26/2024 FINAL  Final  Culture, body fluid w Gram Stain-bottle     Status: None (Preliminary result)   Collection Time: 01/26/24  3:18 PM   Specimen: Ascitic  Result Value Ref Range Status   Specimen Description ASCITIC  Final   Special Requests BOTTLES DRAWN AEROBIC AND ANAEROBIC 10CC  Final   Culture    Final    NO GROWTH 4 DAYS Performed at [facility], [address], [place], [place] [address]    Report Status PENDING  Incomplete     Radiology Studies: No results found.  Scheduled Meds:  arformoterol   15 mcg Nebulization BID   budesonide  (PULMICORT ) nebulizer solution  0.5 mg Nebulization BID   busPIRone   7.5 mg Oral TID   DULoxetine   60 mg Oral Daily   fluticasone   2 spray Each Nare Daily   folic acid   1 mg Oral Daily   furosemide   40 mg Oral Daily   gabapentin   300 mg Oral TID   insulin  aspart  0-6 Units Subcutaneous Q4H   insulin  aspart protamine- aspart  15 Units Subcutaneous BID WC   lactulose   20 g Oral BID   magnesium  oxide  400 mg Oral Daily   midodrine   5 mg Oral TID WC   multivitamin with minerals  1 tablet Oral Daily   pantoprazole  (PROTONIX ) IV  40 mg Intravenous Q12H   rifaximin   550 mg Oral BID   spironolactone   100 mg Oral Daily   thiamine   100 mg Oral Daily   Or   thiamine   100 mg Intravenous Daily   traZODone   100 mg Oral QHS   Continuous Infusions:  cefTRIAXone  (ROCEPHIN )  IV 1 g (01/30/24 1756)     LOS: 5 days   Eric Nunnery, MD [facility] Available via Epic secure chat 7am-7pm After these hours,  please refer to coverage provider listed on [email or web address] 01/30/2024, 6:11 PM

## 2024-01-30 NOTE — Plan of Care (Signed)
  Problem: Education: Goal: Knowledge of General Education information will improve Description: Including pain rating scale, medication(s)/side effects and non-pharmacologic comfort measures Outcome: Progressing   Problem: Health Behavior/Discharge Planning: Goal: Ability to manage health-related needs will improve Outcome: Not Progressing   Problem: Activity: Goal: Risk for activity intolerance will decrease Outcome: Not Progressing

## 2024-01-31 ENCOUNTER — Other Ambulatory Visit: Payer: Self-pay

## 2024-01-31 ENCOUNTER — Inpatient Hospital Stay (HOSPITAL_COMMUNITY): Payer: Medicare HMO

## 2024-01-31 DIAGNOSIS — K7011 Alcoholic hepatitis with ascites: Secondary | ICD-10-CM | POA: Diagnosis not present

## 2024-01-31 DIAGNOSIS — R933 Abnormal findings on diagnostic imaging of other parts of digestive tract: Secondary | ICD-10-CM | POA: Diagnosis not present

## 2024-01-31 DIAGNOSIS — D509 Iron deficiency anemia, unspecified: Secondary | ICD-10-CM | POA: Diagnosis not present

## 2024-01-31 DIAGNOSIS — R0600 Dyspnea, unspecified: Secondary | ICD-10-CM | POA: Diagnosis not present

## 2024-01-31 DIAGNOSIS — G9340 Encephalopathy, unspecified: Secondary | ICD-10-CM | POA: Diagnosis not present

## 2024-01-31 DIAGNOSIS — K766 Portal hypertension: Secondary | ICD-10-CM | POA: Diagnosis not present

## 2024-01-31 DIAGNOSIS — K7031 Alcoholic cirrhosis of liver with ascites: Secondary | ICD-10-CM | POA: Diagnosis not present

## 2024-01-31 DIAGNOSIS — R918 Other nonspecific abnormal finding of lung field: Secondary | ICD-10-CM | POA: Diagnosis not present

## 2024-01-31 DIAGNOSIS — R531 Weakness: Secondary | ICD-10-CM | POA: Diagnosis not present

## 2024-01-31 LAB — COMPREHENSIVE METABOLIC PANEL
ALT: 25 U/L (ref 0–44)
AST: 64 U/L — ABNORMAL HIGH (ref 15–41)
Albumin: 1.9 g/dL — ABNORMAL LOW (ref 3.5–5.0)
Alkaline Phosphatase: 142 U/L — ABNORMAL HIGH (ref 38–126)
Anion gap: 9 (ref 5–15)
BUN: 29 mg/dL — ABNORMAL HIGH (ref 8–23)
CO2: 26 mmol/L (ref 22–32)
Calcium: 8.4 mg/dL — ABNORMAL LOW (ref 8.9–10.3)
Chloride: 99 mmol/L (ref 98–111)
Creatinine, Ser: 1.18 mg/dL — ABNORMAL HIGH (ref 0.44–1.00)
GFR, Estimated: 52 mL/min — ABNORMAL LOW (ref >60)
Glucose, Bld: 121 mg/dL — ABNORMAL HIGH (ref 70–99)
Potassium: 4.2 mmol/L (ref 3.5–5.1)
Sodium: 134 mmol/L — ABNORMAL LOW (ref 135–145)
Total Bilirubin: 4.8 mg/dL — ABNORMAL HIGH (ref 0.0–1.2)
Total Protein: 5.3 g/dL — ABNORMAL LOW (ref 6.5–8.1)

## 2024-01-31 LAB — CBC
HCT: 28.7 % — ABNORMAL LOW (ref 36.0–46.0)
Hemoglobin: 9.5 g/dL — ABNORMAL LOW (ref 12.0–15.0)
MCH: 35.3 pg — ABNORMAL HIGH (ref 26.0–34.0)
MCHC: 33.1 g/dL (ref 30.0–36.0)
MCV: 106.7 fL — ABNORMAL HIGH (ref 80.0–100.0)
Platelets: 109 10*3/uL — ABNORMAL LOW (ref 150–400)
RBC: 2.69 MIL/uL — ABNORMAL LOW (ref 3.87–5.11)
RDW: 20.7 % — ABNORMAL HIGH (ref 11.5–15.5)
WBC: 13.2 10*3/uL — ABNORMAL HIGH (ref 4.0–10.5)
nRBC: 0 % (ref 0.0–0.2)

## 2024-01-31 LAB — GLUCOSE, CAPILLARY
Glucose-Capillary: 103 mg/dL — ABNORMAL HIGH (ref 70–99)
Glucose-Capillary: 116 mg/dL — ABNORMAL HIGH (ref 70–99)
Glucose-Capillary: 117 mg/dL — ABNORMAL HIGH (ref 70–99)
Glucose-Capillary: 119 mg/dL — ABNORMAL HIGH (ref 70–99)
Glucose-Capillary: 122 mg/dL — ABNORMAL HIGH (ref 70–99)
Glucose-Capillary: 125 mg/dL — ABNORMAL HIGH (ref 70–99)
Glucose-Capillary: 128 mg/dL — ABNORMAL HIGH (ref 70–99)
Glucose-Capillary: 134 mg/dL — ABNORMAL HIGH (ref 70–99)

## 2024-01-31 LAB — CYTOLOGY - NON PAP

## 2024-01-31 LAB — AMMONIA: Ammonia: 33 umol/L (ref 9–35)

## 2024-01-31 LAB — CULTURE, BLOOD (ROUTINE X 2)
Culture: NO GROWTH
Culture: NO GROWTH
Special Requests: ADEQUATE
Special Requests: ADEQUATE

## 2024-01-31 LAB — PROTIME-INR
INR: 1.9 — ABNORMAL HIGH (ref 0.8–1.2)
Prothrombin Time: 22.2 s — ABNORMAL HIGH (ref 11.4–15.2)

## 2024-01-31 LAB — CULTURE, BODY FLUID W GRAM STAIN -BOTTLE: Culture: NO GROWTH

## 2024-01-31 LAB — MRSA NEXT GEN BY PCR, NASAL: MRSA by PCR Next Gen: NOT DETECTED

## 2024-01-31 MED ORDER — SODIUM CHLORIDE 0.9% FLUSH: 10.0000 mL | INTRAVENOUS | Status: DC | PRN

## 2024-01-31 MED ORDER — NOREPINEPHRINE 4 MG/250ML-% IV SOLN
0.0000 ug/min | INTRAVENOUS | Status: DC
  Administered 2024-01-31: 2 ug/min via INTRAVENOUS
  Administered 2024-02-01: 15 ug/min via INTRAVENOUS
  Filled 2024-01-31 (×3): qty 250

## 2024-01-31 MED ORDER — CHLORHEXIDINE GLUCONATE CLOTH 2 % EX PADS
6.0000 | MEDICATED_PAD | Freq: Every day | CUTANEOUS | Status: DC
  Administered 2024-02-01 – 2024-02-02 (×2): 6 via TOPICAL

## 2024-01-31 MED ORDER — MIDODRINE HCL 5 MG PO TABS
10.0000 mg | ORAL_TABLET | Freq: Three times a day (TID) | ORAL | Status: DC
Start: 2024-01-31 — End: 2024-02-02
  Administered 2024-01-31 – 2024-02-01 (×2): 10 mg via ORAL
  Filled 2024-01-31 (×2): qty 2

## 2024-01-31 MED ORDER — SODIUM CHLORIDE 0.9% FLUSH
10.0000 mL | Freq: Two times a day (BID) | INTRAVENOUS | Status: DC
  Administered 2024-01-31: 20 mL
  Administered 2024-02-01 – 2024-02-02 (×3): 10 mL

## 2024-01-31 MED ORDER — ALBUTEROL SULFATE (2.5 MG/3ML) 0.083% IN NEBU
2.5000 mg | INHALATION_SOLUTION | RESPIRATORY_TRACT | Status: DC | PRN
  Administered 2024-01-31 – 2024-02-01 (×2): 2.5 mg via RESPIRATORY_TRACT
  Filled 2024-01-31 (×2): qty 3

## 2024-01-31 MED ORDER — MIDODRINE HCL 5 MG PO TABS: 10.0000 mg | ORAL_TABLET | Freq: Three times a day (TID) | ORAL | Status: DC

## 2024-01-31 NOTE — Progress Notes (Signed)
 Patient seen and evaluated at bedside after she was noted to become more hypotensive as well as encephalopathic and hypoxemic.  Her oxygen requirements have gone up to 6 L nasal cannula and she is noted to have crackles to her lung bases.  Additionally, blood pressure is soft and noted to be 80-90 systolic.  She will require transfer to ICU for initiation of pressors as midodrine  does not appear to be helping.  Repeat ammonia level ordered and GI assisting with management of lactulose  as well as diuresis.  Plan to hold Lasix  for now until blood pressures improved.  Total critical care time: 30 minutes.

## 2024-01-31 NOTE — Progress Notes (Addendum)
 Subjective: Patient a/ox4. Denies any pain. Appears drowsy but is able to arouse to verbal stimuli and answer questions.  Objective: Vital signs in last 24 hours: Temp:  [97.7 F (36.5 C)-97.9 F (36.6 C)] 97.7 F (36.5 C) (02/05 0350) Pulse Rate:  [106-107] 106 (02/04 1948) Resp:  [19-23] 23 (02/05 0535) BP: (99-108)/(35-94) 108/43 (02/05 0535) SpO2:  [94 %-96 %] 94 % (02/05 0839) Last BM Date : 01/30/24 General:   Alert and oriented, pleasant Head:  Normocephalic and atraumatic. Eyes:  sclera are icteric  Mouth:  Without lesions, mucosa pink and moist.   Heart:  S1, S2 present, no murmurs noted.  Lungs: wheezing present  Abdomen:  Bowel sounds present, soft, non-tender, full but soft/non taut. No HSM or hernias noted. No rebound or guarding. No masses appreciated  Extremities:  Without clubbing or edema. Neurologic:  Alert and  oriented x4;  grossly normal neurologically. Asterixis present on exam though hands are also quite tremulous.  Skin:  Jaundiced  Psych:  arouses to verbal stimuli, cooperative.    Lab Results: Recent Labs    01/29/24 1019 01/30/24 0429 01/31/24 0502  WBC 12.3* 13.6* 13.2*  HGB 8.8* 8.9* 9.5*  HCT 26.9* 27.3* 28.7*  PLT 103* 108* 109*   BMET Recent Labs    01/29/24 0450 01/30/24 0429 01/31/24 0502  NA 132* 134* 134*  K 3.7 3.8 4.2  CL 98 98 99  CO2 26 27 26   GLUCOSE 161* 173* 121*  BUN 22 23 29*  CREATININE 1.22* 1.04* 1.18*  CALCIUM  8.0* 8.5* 8.4*   LFT Recent Labs    01/28/24 1109 01/29/24 0450 01/30/24 0429 01/31/24 0502  PROT 5.4* 5.1* 5.3* 5.3*  ALBUMIN  2.0* 1.8* 1.9* 1.9*  AST 46* 40 52* 64*  ALT 23 22 24 25   ALKPHOS 156* 138* 148* 142*  BILITOT 5.9* 4.9* 4.6* 4.8*  BILIDIR 2.3*  --   --   --   IBILI 3.6*  --   --   --    PT/INR Recent Labs    01/29/24 1019 01/30/24 0429  LABPROT 20.7* 20.7*  INR 1.8* 1.8*    Assessment: Michelle Michael is a 63 y.o. female with a history of CAD, anxiety depression,  diabetes, GERD, HTN anemia, decompensated alcoholic cirrhosis who presented to [facility] ED for jaundice and weakness and found to have acute on chronic anemia with abdominal CT imaging of stomach concerning for PUD. \  Acute on chronic anemia: EGD 2/12 with mild Schatzki ring, portal gastropathy, nonbleeding gastric ulcer, normal duodenum.  Portal gastropathy and PUD likely secondary to alcohol  and NSAID use.  Currently on PPI twice daily and folic acid .  Hemoglobin on admission 9.2, dropped to 7.4, status post 1 unit packed red blood cells with hemoglobin 9.5 today.  No overt GI bleeding.  Alcoholic hepatitis in setting of decompensated alcoholic cirrhosis: -MELD 3.0 today pending updated INR**  -DF today pending updated PT/INR ** -Not a candidate for steroids and presence of continued EtOH intake, concern for underlying infection given leukocytosis on admission (blood cultures negative thus far, SBP negative, acute hepatitis panel negative) -Overall poor prognosis if patient continues to consume alcohol .  Patient has declined rehab but reports she is finding some drinking.  Encephalopathy: Multifactorial in setting of hepatic encephalopathy and alcohol  withdrawal.  Ammonia level mildly elevated at 52 on 2/2.  Currently on lactulose  20 g twice daily with adequate bowel movements.  She did have asterixis present on exam yesterday and continued on exam  today though also tremulous, ammonia level was 46 on 2/4. she was started on Xifaxan . She is drowsy but able to arouse to verbal stimuli and respond, she is a/ox4. Per nursing staff a few BMs over night and one small one so far this morning prior to her lactulose . Will need to titrate lactulose  to 2-3 soft BMs per day and monitor her mental status for adequate response to lactulose  and xifaxan .    Portal hypertension with ascites: Status post paracentesis 01/26/2024 with 2.5 L removed.  Negative for SBP.  Has been on spironolactone  100 mg daily.  Was not taking  her Lasix  40 mg which was prescribed twice daily compliantly, reported only taking this every other day.  Lasix  was on hold since 1/31, due to electrolyte abnormalities which have improved.  Lasix  was resumed at 40 mg daily yesterday, creatnine bumped slightly to 1.18 from 1.04, sodium and potassium remain stable, will need to keep a close eye on renal function and electrolytes.    Plan: Continue PPI twice daily Continue lactulose , titrate for goal of 2-3 soft bowel movements per day Continue Xifaxan  twice daily Continue spironolactone  100 mg daily Continue Lasix  40 mg daily MELD labs and INR daily Continue thiamine  and folic acid  supplementation Complete alcohol  cessation Outpatient colonoscopy  Will Calculate MELD and DF once PT/INR results    LOS: 6 days    01/31/2024, 9:06 AM   Anaily Ashbaugh L. Mariette, MSN, APRN, AGNP-C Adult-Gerontology Nurse Practitioner [facility]  **Addendum: MELD 3.0 is 26 DF is 47.1 with PT control of 13, as above not a candidate for steroids at this time

## 2024-01-31 NOTE — Progress Notes (Signed)
 Peripherally Inserted Central Catheter Placement  The IV Nurse has discussed with the patient and/or persons authorized to consent for the patient, the purpose of this procedure and the potential benefits and risks involved with this procedure.  The benefits include less needle sticks, lab draws from the catheter, and the patient may be discharged home with the catheter. Risks include, but not limited to, infection, bleeding, blood clot (thrombus formation), and puncture of an artery; nerve damage and irregular heartbeat and possibility to perform a PICC exchange if needed/ordered by physician.  Alternatives to this procedure were also discussed.  Bard Power PICC patient education guide, fact sheet on infection prevention and patient information card has been provided to patient /or left at bedside.  Verbal consent given due to patient having tremors. Petechia noteted right arm.  PICC Placement Documentation  PICC Double Lumen 01/31/24 Right Brachial 36 cm 0 cm (Active)  Indication for Insertion or Continuance of Line Vasoactive infusions 01/31/24 1723  Exposed Catheter (cm) 0 cm 01/31/24 1723  Site Assessment Clean, Dry, Intact 01/31/24 1723  Lumen #1 Status Flushed;Saline locked;Blood return noted 01/31/24 1723  Lumen #2 Status Saline locked;Flushed;Blood return noted 01/31/24 1723  Dressing Type Transparent;Securing device 01/31/24 1723  Dressing Status Antimicrobial disc/dressing in place;Clean, Dry, Intact 01/31/24 1723  Line Care Connections checked and tightened 01/31/24 1723  Line Adjustment (NICU/IV Team Only) No 01/31/24 1723  Dressing Change Due 02/07/24 01/31/24 1723       Bonni Rock Larve 01/31/2024, 5:26 PM

## 2024-01-31 NOTE — Progress Notes (Signed)
 PROGRESS NOTE    Michelle Michael  FMW:[id number] DOB: Oct 16, 1961 DOA: 01/25/2024 PCP: Melvenia Manus BRAVO, MD   Brief Narrative:  63 y.o. female with medical history significant for alcoholic cirrhosis of liver, COPD, type 2 diabetes mellitus, GERD, hypertension, dyslipidemia, depression and anxiety presented with weakness and chills along with jaundice.  She was found to have new alcoholic liver cirrhosis with ascites.  GI was consulted.  She underwent paracentesis on 01/26/2024.  She was transfused 1 unit packed red cell transfusion on 01/27/2024.  She underwent EGD on 01/27/2024 which showed portal hypertensive gastropathy and nonbleeding gastric ulcer with a clean ulcer base and GI recommended outpatient colonoscopy.  She was also started on lactulose  and rifaximin  for hepatic encephalopathy.  Assessment & Plan:   Decompensated alcoholic cirrhosis of liver with portal hypertension and ascites Probable hepatic encephalopathy Thrombocytopenia Elevated LFTs Severe hypoalbuminemia -Underwent paracentesis on 01/26/2024: Analysis not consistent with infection.  Completed 5 days of antibiotics: Last dose on 01/30/2024 -Patient still slow to respond.  GI following.  Continue rifaximin , lactulose , Lasix  and spironolactone . -Strict input and output.  Daily weights.  Fluid restriction.  Acute on chronic symptomatic anemia with concerns per upper GI bleeding -Required 1 unit packed red cell transfusion on 01/27/2024 -underwent EGD on 01/27/2024 which showed portal hypertensive gastropathy and nonbleeding gastric ulcer with a clean ulcer base and GI recommended outpatient colonoscopy.  Continue Protonix .  GI following.  Alcohol  abuse -Patient was counseled regarding alcohol  cessation by prior hospitalist.  Unfortunately, patient is not ready to quit drinking alcohol  -Continue CIWA protocol along with folic acid , multivitamin and thiamine   Leukocytosis -monitor  Acute respiratory failure with  hypoxia COPD -Respiratory status worsening.  Currently on 5 L oxygen via nasal cannula.  Check chest x-ray. -Continue current nebs  Anxiety and depression -Continue BuSpar , trazodone  and Cymbalta .  Outpatient follow-up with PCP/psychiatry  Dyslipidemia -Outpatient follow-up  Diabetes mellitus type 2 with peripheral neuropathy -Continue current insulin  regimen along with CBGs with SSI and carb modified diet  Generalized weakness -PT recommending SNF.  TOC following.  Hypotension -Blood pressure intermittently on the lower side.  Continue midodrine : Increase dose.  Accidental right arm dog scratches -patient says dog was vaccinated and it was a scratch not bite -Patient believes that her tetanus is up-to-date -Continue close monitoring.  No superimposed infection or drainage appreciated.  DVT prophylaxis: SCDs Code Status: Full Family Communication: None at bedside Disposition Plan: Status is: Inpatient Remains inpatient appropriate because: Of severity of illness    Consultants: GI  Procedures: As above  Antimicrobials:  Anti-infectives (From admission, onward)    Start     Dose/Rate Route Frequency Ordered Stop   01/30/24 1345  rifaximin  (XIFAXAN ) tablet 550 mg        550 mg Oral 2 times daily 01/30/24 1252     01/26/24 1200  cefTRIAXone  (ROCEPHIN ) 1 g in sodium chloride  0.9 % 100 mL IVPB        1 g 200 mL/hr over 30 Minutes Intravenous Every 24 hours 01/26/24 0932 01/30/24 2031   01/26/24 0400  piperacillin -tazobactam (ZOSYN ) IVPB 3.375 g  Status:  Discontinued        3.375 g 12.5 mL/hr over 240 Minutes Intravenous Every 8 hours 01/25/24 2045 01/26/24 0932   01/25/24 2045  piperacillin -tazobactam (ZOSYN ) IVPB 3.375 g        3.375 g 100 mL/hr over 30 Minutes Intravenous  Once 01/25/24 2044 01/25/24 2150        Subjective: Patient seen  and examined at bedside.  Wakes up slightly, extremely slow to respond.  No fever, agitation or seizures  reported.  Objective: Vitals:   01/30/24 1948 01/31/24 0350 01/31/24 0535 01/31/24 0839  BP: (!) 107/94 (!) 99/35 (!) 108/43   Pulse: (!) 106     Resp: 19 (!) 22 (!) 23   Temp:  97.7 F (36.5 C)    TempSrc:  Axillary    SpO2: 96% 96%  94%  Weight:      Height:       No intake or output data in the 24 hours ending 01/31/24 1137 Filed Weights   01/25/24 1528  Weight: 75.2 kg    Examination:  General exam: Appears calm and comfortable.  On 5 L oxygen via nasal cannula.  Looks chronically ill and deconditioned Respiratory system: Bilateral decreased breath sounds at bases with scattered crackles and tachypnea Cardiovascular system: S1 & S2 heard, Rate controlled Gastrointestinal system: Abdomen is distended, soft and nontender. Normal bowel sounds heard. Extremities: No cyanosis, clubbing; bilateral lower extremity edema present Central nervous system: Will subsequently, answers some questions, extremely slow to respond.  Poor historian.  No focal neurological deficits. Moving extremities Skin: No rashes, lesions or ulcers Psychiatry: Flat affect.  Not agitated.  Data Reviewed: I have personally reviewed following labs and imaging studies  CBC: Recent Labs  Lab 01/25/24 1635 01/26/24 0338 01/27/24 0547 01/28/24 0454 01/29/24 1019 01/30/24 0429 01/31/24 0502  WBC 16.8*   < > 16.5* 17.8* 12.3* 13.6* 13.2*  NEUTROABS 13.3*  --   --   --  10.1* 10.5*  --   HGB 9.2*   < > 7.4* 9.2* 8.8* 8.9* 9.5*  HCT 27.7*   < > 22.0* 27.9* 26.9* 27.3* 28.7*  MCV 110.8*   < > 111.1* 105.3* 106.7* 107.5* 106.7*  PLT 134*   < > 109* 110* 103* 108* 109*   < > = values in this interval not displayed.   Basic Metabolic Panel: Recent Labs  Lab 01/26/24 1226 01/27/24 0547 01/28/24 0454 01/29/24 0450 01/30/24 0429 01/31/24 0502  NA  --  130* 132* 132* 134* 134*  K  --  3.8 3.6 3.7 3.8 4.2  CL  --  96* 97* 98 98 99  CO2  --  25 23 26 27 26   GLUCOSE  --  144* 152* 161* 173* 121*  BUN   --  16 19 22 23  29*  CREATININE  --  1.06* 1.26* 1.22* 1.04* 1.18*  CALCIUM   --  7.3* 7.7* 8.0* 8.5* 8.4*  MG 0.9* 2.0  --   --   --   --   PHOS  --   --  2.4*  --   --   --    GFR: Estimated Creatinine Clearance: 50.2 mL/min (A) (by C-G formula based on SCr of 1.18 mg/dL (H)). Liver Function Tests: Recent Labs  Lab 01/27/24 0547 01/28/24 0454 01/28/24 1109 01/29/24 0450 01/30/24 0429 01/31/24 0502  AST 43*  --  46* 40 52* 64*  ALT 20  --  23 22 24 25   ALKPHOS 116  --  156* 138* 148* 142*  BILITOT 7.3*  --  5.9* 4.9* 4.6* 4.8*  PROT 5.1*  --  5.4* 5.1* 5.3* 5.3*  ALBUMIN  1.9* 2.1* 2.0* 1.8* 1.9* 1.9*   Recent Labs  Lab 01/25/24 1635  LIPASE 34   Recent Labs  Lab 01/25/24 1635 01/28/24 1109 01/30/24 0921  AMMONIA 31 52* 46*   Coagulation Profile: Recent  Labs  Lab 01/25/24 1635 01/27/24 0547 01/28/24 0454 01/29/24 1019 01/30/24 0429  INR 1.6* 1.8* 1.7* 1.8* 1.8*   Cardiac Enzymes: No results for input(s): CKTOTAL, CKMB, CKMBINDEX, TROPONINI in the last 168 hours. BNP (last 3 results) No results for input(s): PROBNP in the last 8760 hours. HbA1C: No results for input(s): HGBA1C in the last 72 hours. CBG: Recent Labs  Lab 01/30/24 1943 01/31/24 0017 01/31/24 0346 01/31/24 0738 01/31/24 1124  GLUCAP 154* 128* 125* 117* 134*   Lipid Profile: No results for input(s): CHOL, HDL, LDLCALC, TRIG, CHOLHDL, LDLDIRECT in the last 72 hours. Thyroid  Function Tests: No results for input(s): TSH, T4TOTAL, FREET4, T3FREE, THYROIDAB in the last 72 hours. Anemia Panel: No results for input(s): VITAMINB12, FOLATE, FERRITIN, TIBC, IRON, RETICCTPCT in the last 72 hours. Sepsis Labs: No results for input(s): PROCALCITON, LATICACIDVEN in the last 168 hours.  Recent Results (from the past 240 hours)  Respiratory (~20 pathogens) panel by PCR     Status: None   Collection Time: 01/26/24 11:12 AM   Specimen: Nasopharyngeal  Swab; Respiratory  Result Value Ref Range Status   Adenovirus NOT DETECTED NOT DETECTED Final   Coronavirus 229E NOT DETECTED NOT DETECTED Final    Comment: (NOTE) The Coronavirus on the Respiratory Panel, DOES NOT test for the novel  Coronavirus (2019 nCoV)    Coronavirus HKU1 NOT DETECTED NOT DETECTED Final   Coronavirus NL63 NOT DETECTED NOT DETECTED Final   Coronavirus OC43 NOT DETECTED NOT DETECTED Final   Metapneumovirus NOT DETECTED NOT DETECTED Final   Rhinovirus / Enterovirus NOT DETECTED NOT DETECTED Final   Influenza A NOT DETECTED NOT DETECTED Final   Influenza B NOT DETECTED NOT DETECTED Final   Parainfluenza Virus 1 NOT DETECTED NOT DETECTED Final   Parainfluenza Virus 2 NOT DETECTED NOT DETECTED Final   Parainfluenza Virus 3 NOT DETECTED NOT DETECTED Final   Parainfluenza Virus 4 NOT DETECTED NOT DETECTED Final   Respiratory Syncytial Virus NOT DETECTED NOT DETECTED Final   Bordetella pertussis NOT DETECTED NOT DETECTED Final   Bordetella Parapertussis NOT DETECTED NOT DETECTED Final   Chlamydophila pneumoniae NOT DETECTED NOT DETECTED Final   Mycoplasma pneumoniae NOT DETECTED NOT DETECTED Final    Comment: Performed at [facility], [address], [place], [place] [address]  Culture, blood (Routine X 2) w Reflex to ID Panel     Status: None   Collection Time: 01/26/24 12:26 PM   Specimen: BLOOD  Result Value Ref Range Status   Specimen Description BLOOD RIGHT ANTECUBITAL  Final   Special Requests   Final    Blood Culture adequate volume BOTTLES DRAWN AEROBIC AND ANAEROBIC   Culture   Final    NO GROWTH 5 DAYS Performed at [facility], [address] [place], [place] [address]    Report Status 01/31/2024 FINAL  Final  Culture, blood (Routine X 2) w Reflex to ID Panel     Status: None   Collection Time: 01/26/24 12:26 PM   Specimen: BLOOD  Result Value Ref Range Status   Specimen Description BLOOD LEFT ANTECUBITAL  Final   Special Requests   Final     BOTTLES DRAWN AEROBIC AND ANAEROBIC Blood Culture adequate volume   Culture   Final    NO GROWTH 5 DAYS Performed at [facility], [address] [place], [place] [address]    Report Status 01/31/2024 FINAL  Final  Acid Fast Smear (AFB)     Status: None  Collection Time: 01/26/24  3:18 PM  Result Value Ref Range Status   AFB Specimen Processing Concentration  Final   Acid Fast Smear Negative  Final    Comment: (NOTE) Performed At: [facility] [address] [place], [place] [id number] Jennette Shorter MD Ey:[id number]    Source (AFB) ASCITIC  Final    Comment: Performed at [facility], [address], [place], [place] [address]  Gram stain     Status: None   Collection Time: 01/26/24  3:18 PM   Specimen: Ascitic  Result Value Ref Range Status   Specimen Description ASCITIC  Final   Special Requests NONE  Final   Gram Stain   Final    PERITONEAL CYTOSPIN SMEAR NO ORGANISMS SEEN WBC PRESENT,BOTH PMN AND MONONUCLEAR Performed at [facility], [address] [place], [place] [address]    Report Status 01/26/2024 FINAL  Final  Culture, body fluid w Gram Stain-bottle     Status: None   Collection Time: 01/26/24  3:18 PM   Specimen: Ascitic  Result Value Ref Range Status   Specimen Description ASCITIC  Final   Special Requests BOTTLES DRAWN AEROBIC AND ANAEROBIC 10CC  Final   Culture   Final    NO GROWTH 5 DAYS Performed at [facility], [address], [place], [place] [address]    Report Status 01/31/2024 FINAL  Final         Radiology Studies: No results found.      Scheduled Meds:  arformoterol   15 mcg Nebulization BID   budesonide  (PULMICORT ) nebulizer solution  0.5 mg Nebulization BID   busPIRone   7.5 mg Oral TID   DULoxetine   60 mg Oral Daily   fluticasone   2 spray Each Nare Daily   folic acid   1 mg Oral Daily   furosemide   40 mg Oral Daily   gabapentin   300 mg Oral TID   insulin  aspart  0-6 Units Subcutaneous Q4H   insulin  aspart protamine-  aspart  15 Units Subcutaneous BID WC   lactulose   20 g Oral BID   magnesium  oxide  400 mg Oral Daily   midodrine   5 mg Oral TID WC   multivitamin with minerals  1 tablet Oral Daily   pantoprazole  (PROTONIX ) IV  40 mg Intravenous Q12H   rifaximin   550 mg Oral BID   spironolactone   100 mg Oral Daily   thiamine   100 mg Oral Daily   Or   thiamine   100 mg Intravenous Daily   traZODone   100 mg Oral QHS   Continuous Infusions:        Sophie Mao, MD [facility] 01/31/2024, 11:37 AM

## 2024-02-01 ENCOUNTER — Other Ambulatory Visit: Payer: Self-pay

## 2024-02-01 DIAGNOSIS — J189 Pneumonia, unspecified organism: Secondary | ICD-10-CM

## 2024-02-01 DIAGNOSIS — F1090 Alcohol use, unspecified, uncomplicated: Secondary | ICD-10-CM | POA: Diagnosis not present

## 2024-02-01 DIAGNOSIS — R579 Shock, unspecified: Secondary | ICD-10-CM

## 2024-02-01 DIAGNOSIS — K7031 Alcoholic cirrhosis of liver with ascites: Secondary | ICD-10-CM | POA: Diagnosis not present

## 2024-02-01 DIAGNOSIS — D509 Iron deficiency anemia, unspecified: Secondary | ICD-10-CM | POA: Diagnosis not present

## 2024-02-01 DIAGNOSIS — J9601 Acute respiratory failure with hypoxia: Secondary | ICD-10-CM | POA: Diagnosis not present

## 2024-02-01 DIAGNOSIS — R933 Abnormal findings on diagnostic imaging of other parts of digestive tract: Secondary | ICD-10-CM | POA: Diagnosis not present

## 2024-02-01 DIAGNOSIS — R531 Weakness: Secondary | ICD-10-CM | POA: Diagnosis not present

## 2024-02-01 DIAGNOSIS — K766 Portal hypertension: Secondary | ICD-10-CM | POA: Diagnosis not present

## 2024-02-01 DIAGNOSIS — K7011 Alcoholic hepatitis with ascites: Secondary | ICD-10-CM | POA: Diagnosis not present

## 2024-02-01 DIAGNOSIS — G9340 Encephalopathy, unspecified: Secondary | ICD-10-CM | POA: Diagnosis not present

## 2024-02-01 LAB — COMPREHENSIVE METABOLIC PANEL
ALT: 28 U/L (ref 0–44)
AST: 76 U/L — ABNORMAL HIGH (ref 15–41)
Albumin: 1.9 g/dL — ABNORMAL LOW (ref 3.5–5.0)
Alkaline Phosphatase: 136 U/L — ABNORMAL HIGH (ref 38–126)
Anion gap: 9 (ref 5–15)
BUN: 38 mg/dL — ABNORMAL HIGH (ref 8–23)
CO2: 26 mmol/L (ref 22–32)
Calcium: 8.8 mg/dL — ABNORMAL LOW (ref 8.9–10.3)
Chloride: 100 mmol/L (ref 98–111)
Creatinine, Ser: 1.62 mg/dL — ABNORMAL HIGH (ref 0.44–1.00)
GFR, Estimated: 36 mL/min — ABNORMAL LOW (ref >60)
Glucose, Bld: 143 mg/dL — ABNORMAL HIGH (ref 70–99)
Potassium: 4.3 mmol/L (ref 3.5–5.1)
Sodium: 135 mmol/L (ref 135–145)
Total Bilirubin: 5.4 mg/dL — ABNORMAL HIGH (ref 0.0–1.2)
Total Protein: 5.4 g/dL — ABNORMAL LOW (ref 6.5–8.1)

## 2024-02-01 LAB — STREP PNEUMONIAE URINARY ANTIGEN: Strep Pneumo Urinary Antigen: NEGATIVE

## 2024-02-01 LAB — CBC WITH DIFFERENTIAL/PLATELET
Abs Immature Granulocytes: 0.12 10*3/uL — ABNORMAL HIGH (ref 0.00–0.07)
Basophils Absolute: 0 10*3/uL (ref 0.0–0.1)
Basophils Relative: 0 %
Eosinophils Absolute: 0.1 10*3/uL (ref 0.0–0.5)
Eosinophils Relative: 1 %
HCT: 27.9 % — ABNORMAL LOW (ref 36.0–46.0)
Hemoglobin: 9.4 g/dL — ABNORMAL LOW (ref 12.0–15.0)
Immature Granulocytes: 1 %
Lymphocytes Relative: 7 %
Lymphs Abs: 1 10*3/uL (ref 0.7–4.0)
MCH: 36 pg — ABNORMAL HIGH (ref 26.0–34.0)
MCHC: 33.7 g/dL (ref 30.0–36.0)
MCV: 106.9 fL — ABNORMAL HIGH (ref 80.0–100.0)
Monocytes Absolute: 1.4 10*3/uL — ABNORMAL HIGH (ref 0.1–1.0)
Monocytes Relative: 9 %
Neutro Abs: 12 10*3/uL — ABNORMAL HIGH (ref 1.7–7.7)
Neutrophils Relative %: 82 %
Platelets: 146 10*3/uL — ABNORMAL LOW (ref 150–400)
RBC: 2.61 MIL/uL — ABNORMAL LOW (ref 3.87–5.11)
RDW: 20.9 % — ABNORMAL HIGH (ref 11.5–15.5)
WBC: 14.5 10*3/uL — ABNORMAL HIGH (ref 4.0–10.5)
nRBC: 0 % (ref 0.0–0.2)

## 2024-02-01 LAB — GLUCOSE, CAPILLARY
Glucose-Capillary: 145 mg/dL — ABNORMAL HIGH (ref 70–99)
Glucose-Capillary: 136 mg/dL — ABNORMAL HIGH (ref 70–99)
Glucose-Capillary: 137 mg/dL — ABNORMAL HIGH (ref 70–99)
Glucose-Capillary: 147 mg/dL — ABNORMAL HIGH (ref 70–99)
Glucose-Capillary: 148 mg/dL — ABNORMAL HIGH (ref 70–99)

## 2024-02-01 LAB — RESPIRATORY PANEL BY PCR

## 2024-02-01 LAB — PROTEIN, PLEURAL OR PERITONEAL FLUID: Total protein, fluid: <3 g/dL

## 2024-02-01 LAB — COOXEMETRY PANEL
Carboxyhemoglobin: 3.2 % — ABNORMAL HIGH (ref 0.5–1.5)
Methemoglobin: <0.7 % (ref 0.0–1.5)
O2 Saturation: 99 %
Total hemoglobin: 9 g/dL — ABNORMAL LOW (ref 12.0–16.0)

## 2024-02-01 LAB — URINALYSIS, ROUTINE W REFLEX MICROSCOPIC
Bilirubin Urine: NEGATIVE
Glucose, UA: NEGATIVE mg/dL
Ketones, ur: NEGATIVE mg/dL
Nitrite: NEGATIVE
Protein, ur: 30 mg/dL — AB
Specific Gravity, Urine: 1.02 (ref 1.005–1.030)
WBC, UA: >50 WBC/hpf (ref 0–5)
pH: 5 (ref 5.0–8.0)

## 2024-02-01 LAB — LACTATE DEHYDROGENASE, PLEURAL OR PERITONEAL FLUID: LD, Fluid: 47 U/L — ABNORMAL HIGH (ref 3–23)

## 2024-02-01 LAB — PROCALCITONIN: Procalcitonin: 2.18 ng/mL

## 2024-02-01 LAB — BLOOD GAS, ARTERIAL
Acid-Base Excess: 2.2 mmol/L — ABNORMAL HIGH (ref 0.0–2.0)
Bicarbonate: 28.3 mmol/L — ABNORMAL HIGH (ref 20.0–28.0)
Drawn by: 27407
O2 Saturation: 98.9 %
Patient temperature: 36.9
pCO2 arterial: 49 mm[Hg] — ABNORMAL HIGH (ref 32–48)
pH, Arterial: 7.37 (ref 7.35–7.45)
pO2, Arterial: 82 mm[Hg] — ABNORMAL LOW (ref 83–108)

## 2024-02-01 LAB — ALBUMIN, PLEURAL OR PERITONEAL FLUID: Albumin, Fluid: <1.5 g/dL

## 2024-02-01 LAB — BODY FLUID CELL COUNT WITH DIFFERENTIAL
Lymphs, Fluid: 38 %
Monocyte-Macrophage-Serous Fluid: 36 % — ABNORMAL LOW (ref 50–90)
Neutrophil Count, Fluid: 26 % — ABNORMAL HIGH (ref 0–25)
Total Nucleated Cell Count, Fluid: 19 uL (ref 0–1000)

## 2024-02-01 LAB — SARS CORONAVIRUS 2 BY RT PCR: SARS Coronavirus 2 by RT PCR: NEGATIVE

## 2024-02-01 LAB — LACTATE DEHYDROGENASE: LDH: 324 U/L — ABNORMAL HIGH (ref 98–192)

## 2024-02-01 LAB — PROTEIN, TOTAL: Total Protein: 5.2 g/dL — ABNORMAL LOW (ref 6.5–8.1)

## 2024-02-01 LAB — MAGNESIUM: Magnesium: 2.1 mg/dL (ref 1.7–2.4)

## 2024-02-01 MED ORDER — NOREPINEPHRINE 16 MG/250ML-% IV SOLN
0.0000 ug/min | INTRAVENOUS | Status: DC
  Administered 2024-02-01: 15 ug/min via INTRAVENOUS
  Administered 2024-02-02: 18 ug/min via INTRAVENOUS
  Filled 2024-02-01 (×2): qty 250

## 2024-02-01 MED ORDER — DEXMEDETOMIDINE HCL IN NACL 400 MCG/100ML IV SOLN
0.0000 ug/kg/h | INTRAVENOUS | Status: DC
  Administered 2024-02-01 – 2024-02-02 (×2): 0.4 ug/kg/h via INTRAVENOUS
  Filled 2024-02-01 (×2): qty 100

## 2024-02-01 MED ORDER — REVEFENACIN 175 MCG/3ML IN SOLN
175.0000 ug | Freq: Every day | RESPIRATORY_TRACT | Status: DC
  Administered 2024-02-02: 175 ug via RESPIRATORY_TRACT
  Filled 2024-02-01: qty 3

## 2024-02-01 MED ORDER — SODIUM CHLORIDE 0.9 % IV SOLN
2.0000 g | Freq: Two times a day (BID) | INTRAVENOUS | Status: DC
  Administered 2024-02-01: 2 g via INTRAVENOUS
  Filled 2024-02-01: qty 12.5

## 2024-02-01 MED ORDER — ALBUMIN HUMAN 25 % IV SOLN
25.0000 g | Freq: Four times a day (QID) | INTRAVENOUS | Status: AC
  Administered 2024-02-01 – 2024-02-02 (×4): 25 g via INTRAVENOUS
  Filled 2024-02-01 (×4): qty 100

## 2024-02-01 MED ORDER — LIDOCAINE HCL (PF) 1 % IJ SOLN
INTRAMUSCULAR | Status: AC
  Filled 2024-02-01: qty 5

## 2024-02-01 MED ORDER — THIAMINE HCL 100 MG/ML IJ SOLN
500.0000 mg | Freq: Three times a day (TID) | INTRAVENOUS | Status: DC
  Administered 2024-02-01 – 2024-02-02 (×3): 500 mg via INTRAVENOUS
  Filled 2024-02-01 (×5): qty 5

## 2024-02-01 MED ORDER — VASOPRESSIN 20 UNITS/100 ML INFUSION FOR SHOCK
0.0400 [IU]/min | INTRAVENOUS | Status: DC
  Administered 2024-02-01 – 2024-02-02 (×3): 0.04 [IU]/min via INTRAVENOUS
  Filled 2024-02-01 (×3): qty 100

## 2024-02-01 MED ORDER — PIPERACILLIN-TAZOBACTAM 3.375 G IVPB
3.3750 g | Freq: Three times a day (TID) | INTRAVENOUS | Status: DC
  Administered 2024-02-01 – 2024-02-02 (×2): 3.375 g via INTRAVENOUS
  Filled 2024-02-01 (×3): qty 50

## 2024-02-01 MED ORDER — VANCOMYCIN HCL 1250 MG/250ML IV SOLN
1250.0000 mg | INTRAVENOUS | Status: DC
  Administered 2024-02-01: 1250 mg via INTRAVENOUS
  Filled 2024-02-01: qty 250

## 2024-02-01 MED ORDER — FUROSEMIDE 10 MG/ML IJ SOLN
40.0000 mg | Freq: Once | INTRAMUSCULAR | Status: AC
  Administered 2024-02-01: 40 mg via INTRAVENOUS
  Filled 2024-02-01: qty 4

## 2024-02-01 MED ORDER — ALBUMIN HUMAN 25 % IV SOLN
25.0000 g | Freq: Once | INTRAVENOUS | Status: AC
  Administered 2024-02-01: 25 g via INTRAVENOUS
  Filled 2024-02-01: qty 100

## 2024-02-01 NOTE — Progress Notes (Signed)
 02/01/2024 Accepted for shock and worsening respiratory failure to [facility] or [facility]  Ardelle Kos MD PCCM

## 2024-02-01 NOTE — Progress Notes (Signed)
 Gastroenterology Progress Note   Referring Provider: No ref. provider found Primary Care Physician:  Melvenia Manus BRAVO, MD Primary Gastroenterologist:  Carlin POUR. Cindie, DO Patient ID: Michelle Michael; [id number]; [birth year]   Subjective:    Patient unable to provide reliable history. Per nursing staff seemed to aspirate trying to take meds this morning. Swallowing evaluation ordered. Likely to be transferred to [facility] due to hypotension/shock.  Objective:   Vital signs in last 24 hours: Temp:  [97.8 F (36.6 C)-98.7 F (37.1 C)] 98.4 F (36.9 C) (02/06 0757) Pulse Rate:  [103-121] 121 (02/06 0815) Resp:  [14-29] 23 (02/06 0245) BP: (78-141)/(20-98) 108/30 (02/06 0815) SpO2:  [87 %-99 %] 98 % (02/06 0815) Weight:  [75.4 kg-76.7 kg] 75.4 kg (02/06 0500) Last BM Date : 01/31/24 General:  appears acutely ill. Currently on 7L oxygen via high flow nasal cannula. Chronically ill appearing. NAD Eyes: + icterus Head:  Normocephalic and atraumatic Chest: decreased breath sounds bases.     Heart:  tachy. Abdomen:  Soft, nontender and nondistended. hypoactive bowel sounds, without guarding, and without rebound.   Extremities:  2+bilateral lower edema  Neurologic:  Alert, grossly normal neurologically confused.   Intake/Output from previous day: 02/05 0701 - 02/06 0700 In: 80.7 [I.V.:80.7] Out: -  Intake/Output this shift: No intake/output data recorded.  Lab Results: CBC Recent Labs    01/30/24 0429 01/31/24 0502 02/01/24 0454  WBC 13.6* 13.2* 14.5*  HGB 8.9* 9.5* 9.4*  HCT 27.3* 28.7* 27.9*  MCV 107.5* 106.7* 106.9*  PLT 108* 109* 146*   BMET Recent Labs    01/30/24 0429 01/31/24 0502 02/01/24 0454  NA 134* 134* 135  K 3.8 4.2 4.3  CL 98 99 100  CO2 27 26 26   GLUCOSE 173* 121* 143*  BUN 23 29* 38*  CREATININE 1.04* 1.18* 1.62*  CALCIUM  8.5* 8.4* 8.8*   LFTs Recent Labs    01/30/24 0429 01/31/24 0502 02/01/24 0454  BILITOT 4.6* 4.8* 5.4*  ALKPHOS  148* 142* 136*  AST 52* 64* 76*  ALT 24 25 28   PROT 5.3* 5.3* 5.4*  ALBUMIN  1.9* 1.9* 1.9*   No results for input(s): LIPASE in the last 72 hours. PT/INR Recent Labs    01/29/24 1019 01/30/24 0429 01/31/24 1233  LABPROT 20.7* 20.7* 22.2*  INR 1.8* 1.8* 1.9*         Imaging Studies: DG CHEST PORT 1 VIEW Result Date: 01/31/2024 CLINICAL DATA:  Dyspnea EXAM: PORTABLE CHEST 1 VIEW COMPARISON:  Chest x-ray 01/25/2024 FINDINGS: Diffuse bilateral airspace opacities are seen, predominantly centrally. This is most significant in the right upper lobe. There is no pleural effusion or pneumothorax. The cardiomediastinal silhouette is within normal limits. No acute fractures are seen. IMPRESSION: Diffuse bilateral airspace opacities, predominantly centrally. This is most significant in the right upper lobe. Findings may be related to multifocal pneumonia, pulmonary edema and/or hemorrhage. Electronically Signed   By: Greig Pique M.D.   On: 01/31/2024 18:06   US  EKG SITE RITE Result Date: 01/31/2024 If Site Rite image not attached, placement could not be confirmed due to current cardiac rhythm.  US  Paracentesis Result Date: 01/26/2024 INDICATION: Patient with history of ETOH cirrhosis admitted with jaundice, found to have new onset ascites. Request for diagnostic and therapeutic paracentesis. EXAM: ULTRASOUND GUIDED DIAGNOSTIC AND THERAPEUTIC PARACENTESIS MEDICATIONS: 8 mL 1% lidocaine  COMPLICATIONS: None immediate. PROCEDURE: Informed written consent was obtained from the patient after a discussion of the risks, benefits and alternatives to treatment. A timeout  was performed prior to the initiation of the procedure. Initial ultrasound scanning demonstrates a moderate amount of ascites within the right upper abdominal quadrant. The right upper abdomen was prepped and draped in the usual sterile fashion. 1% lidocaine  was used for local anesthesia. Following this, a 19 gauge, 7-cm, Yueh catheter was  introduced. An ultrasound image was saved for documentation purposes. The paracentesis was performed. The catheter was removed and a dressing was applied. The patient tolerated the procedure well without immediate post procedural complication. FINDINGS: A total of approximately 2.5 L of clear yellow fluid was removed. Samples were sent to the laboratory as requested by the clinical team. IMPRESSION: Successful ultrasound-guided paracentesis yielding 2.5 liters of peritoneal fluid. Performed by Clotilda Hesselbach, PA-C Electronically Signed   By: Marcey Moan M.D.   On: 01/26/2024 16:00   DG Chest Port 1 View Result Date: 01/25/2024 CLINICAL DATA:  Hypoxia. EXAM: PORTABLE CHEST 1 VIEW COMPARISON:  06/17/2015, lung bases from abdominal CT earlier today FINDINGS: The cardiomediastinal contours are normal. The lungs are clear. Pulmonary vasculature is normal. No consolidation, pleural effusion, or pneumothorax. No acute osseous abnormalities are seen. IMPRESSION: No acute chest findings. Electronically Signed   By: Andrea Gasman M.D.   On: 01/25/2024 23:34   CT ABDOMEN PELVIS W CONTRAST Result Date: 01/25/2024 CLINICAL DATA:  Abdominal pain, acute, nonlocalized EXAM: CT ABDOMEN AND PELVIS WITH CONTRAST TECHNIQUE: Multidetector CT imaging of the abdomen and pelvis was performed using the standard protocol following bolus administration of intravenous contrast. RADIATION DOSE REDUCTION: This exam was performed according to the departmental dose-optimization program which includes automated exposure control, adjustment of the mA and/or kV according to patient size and/or use of iterative reconstruction technique. CONTRAST:  OMNIPAQUE  IOHEXOL  300 MG/ML  SOLN COMPARISON:  Ultrasound abdomen 12/15/2023 FINDINGS: Lower chest: No acute abnormality. Hepatobiliary: Nodular hepatic contour. No focal liver abnormality. Status post cholecystectomy. No biliary dilatation. Pancreas: No focal lesion. Normal pancreatic  contour. No surrounding inflammatory changes. No main pancreatic ductal dilatation. Spleen: Normal in size without focal abnormality. Adrenals/Urinary Tract: No adrenal nodule bilaterally. Bilateral kidneys enhance symmetrically. Fluid dense lesion likely represents a simple renal cyst. Simple renal cysts, in the absence of clinically indicated signs/symptoms, require no independent follow-up. No hydronephrosis. No hydroureter. The urinary bladder is unremarkable. Stomach/Bowel: Question thinning and slight hazy contour of the greater curvature (2:26, 4:22). No evidence of bowel wall thickening or dilatation. Appendix appears normal. Vascular/Lymphatic: The portal, splenic, superior mesenteric veins are patent. No abdominal aorta or iliac aneurysm. Severe atherosclerotic plaque of the aorta and its branches. No abdominal, pelvic, or inguinal lymphadenopathy. Reproductive: Uterus and bilateral adnexa are unremarkable. Other: Moderate volume simple free fluid ascites. No intraperitoneal free gas. No organized fluid collection. Musculoskeletal: Diffuse subsegmental atelectasis. No suspicious lytic or blastic osseous lesions. No acute displaced fracture. IMPRESSION: 1. Question thinning and slight hazy contour of the greater curvature. Underlying ulceration is not excluded. No bowel perforation. Consider correlation with direct visualization. 2. Cirrhosis. No focal liver lesions identified. Please note that liver protocol enhanced MR and CT are the most sensitive tests for the screening detection of hepatocellular carcinoma in the high risk setting of cirrhosis. 3. Moderate volume simple free fluid ascites. 4.  Aortic Atherosclerosis (ICD10-I70.0). Electronically Signed   By: Morgane  Naveau M.D.   On: 01/25/2024 22:24  [2 weeks]  Assessment:   63 year old female with history of CAD, anxiety, depression, diabetes, GERD, hypertension, anemia, decompensated alcoholic cirrhosis who presented to [facility] ED  for jaundice and weakness and found to have acute on chronic anemia with abnormal CT imaging of the stomach concerning for PUD.  Acute on chronic anemia: - In the setting of folate deficiency, portal gastropathy, and PUD, likely secondary to alcohol  and NSAID use.   - Admitting Hgb 9.2, down from 11 range several months prior, dropping to low of 7.4 and received 1 unit PRBCs with Hgb improved to 9.2 on 2/2. Has been fluctuating but overall stable with current Hgb 9.4.  - She has denied overt GI bleeding, none recorded this admission.  - EGD 2/1 with  mild Schatzki ring, portal gastropathy, non-bleeding gastric ulcer, normal duodenum. In setting of etoh and NSAIDs. - Currently on PPI BID and folic acid .  - Also recevied Rocephin  for SBP prophylaxis for 5 day course.     Alcoholic hepatitis in setting of decompensated alcoholic cirrhosis: -MELD 3.0 2/5, 26.  -DF 2/5, 47.1 using PT control of 13 -Not a candidate for steroids, due to compliance concerns in presence of continued EtOH intake, concern for underlying infection given leukocytosis on admission (blood cultures negative thus far, SBP negative, acute hepatitis panel negative, U/A today s/o UTI -Overall poor prognosis if patient continues to consume alcohol .  Patient has declined rehab but reports she is finding some drinking.   Encephalopathy: Multifactorial in setting of hepatic encephalopathy and alcohol  withdrawal.  Ammonia level mildly elevated at 52 on 2/2.  Currently on lactulose  20 g twice daily with 5 BMS 2/4 but none recorded since yesterday. She did have asterixis present on exam previously but along with generalized tremors. Started on Xifaxan . Today could not evaluate for asterixis. She does arouse but confused.   Will need to titrate lactulose  to 2-3 soft BMs per day and monitor her mental status for adequate response to lactulose  and xifaxan .     Portal hypertension with ascites: Status post paracentesis 01/26/2024 with 2.5 L  removed.  Negative for SBP.  Has been on spironolactone  100 mg daily.  Was not taking her Lasix  40 mg which was prescribed twice daily compliantly, reported only taking this every other day.  Lasix  was on hold since 1/31, due to electrolyte abnormalities which have improved.  Lasix  was resumed at 40 mg daily, creatnine bumped slightly to 1.18 from 1.04, and up to 1.62 today. Bump in creatinine likely more related to acute illness, requiring pressor support. Sodium and potassium remain stable, will need to keep a close eye on renal function and electrolytes.      Plan:   Continue IV PPI BID while inpatient, transition to po at discharge. Continue lactulose , titrate to 3-4 soft stools daily. Continue at discharge.  Continue Xifaxan  550mg  BID.  Continue spironolactone  100mg  daily if BP allows. Continue lasix  40mg  daily if BP allows. Daily CBC, CMET, PT/INR. Continue thiamine , folic acid .  Complete etoh cessation advised. No NSAIDS or ASA powders.  Agree with IV albumin . Patient has been accepted in transfer to [facility] due to shock and worsening respiratory failure.    LOS: 7 days   Sonny RAMAN. Ezzard RIGGERS [facility] [phone] 2/6/20258:26 AM

## 2024-02-01 NOTE — Progress Notes (Signed)
 Patient placed on full face bipap 10/5 70% post ABG.  Patient tolerating well at this time.

## 2024-02-01 NOTE — TOC Progression Note (Signed)
 Transition of Care [facility]) - Progression Note    Patient Details  Name: Michelle Michael MRN: [id number] Date of Birth: 09-02-1961  Transition of Care [facility]) CM/SW Contact  Isaiah Public, LCSWA Phone Number: 02/01/2024, 4:34 PM  Clinical Narrative:     Patient has SNF bed at [facility] rehab. CSW will continue to follow. CSW will restart insurance authorization closer to patient being medically ready for dc.  Expected Discharge Plan: Skilled Nursing Facility Barriers to Discharge: Continued Medical Work up  Expected Discharge Plan and Services In-house Referral: Clinical Social Work Discharge Planning Services: CM Consult Post Acute Care Choice: Skilled Nursing Facility Living arrangements for the past 2 months: Single Family Home                                       Social Determinants of Health (SDOH) Interventions SDOH Screenings   Food Insecurity: No Food Insecurity (01/26/2024)  Housing: Low Risk  (01/26/2024)  Transportation Needs: Unmet Transportation Needs (01/26/2024)  Utilities: Not At Risk (01/26/2024)  Alcohol  Screen: Medium Risk (01/12/2024)  Depression (PHQ2-9): Low Risk  (01/25/2024)  Financial Resource Strain: Low Risk  (01/12/2024)  Physical Activity: Insufficiently Active (01/12/2024)  Social Connections: Socially Isolated (01/26/2024)  Stress: Stress Concern Present (01/12/2024)  Tobacco Use: High Risk (01/26/2024)  Health Literacy: Adequate Health Literacy (01/16/2024)    Readmission Risk Interventions    02/01/2024   11:18 AM 01/29/2024    1:34 PM 01/26/2024    9:20 AM  Readmission Risk Prevention Plan  Transportation Screening Complete Complete Complete  HRI or Home Care Consult  Complete Complete  Social Work Consult for Recovery Care Planning/Counseling  Complete Complete  Palliative Care Screening  Not Applicable Not Applicable  Medication Review Oceanographer) Complete Complete Complete  HRI or Home Care Consult Complete    SW Recovery  Care/Counseling Consult Complete    Palliative Care Screening Not Applicable    Skilled Nursing Facility Complete

## 2024-02-01 NOTE — Progress Notes (Signed)
 Pharmacy Antibiotic Note  Michelle Michael is a 63 y.o. female admitted on 01/25/2024 with sepsis.  Pharmacy has been consulted for vancomycin  dosing.  Already receiving cefepime .  Plan: Vancomycin  1250 mg IV q 36 hrs for now. Calc AUC 490 w/ Scr 1.62. Continue cefepime  2g q 12 hrs F/u cultures, renal function and clinical course.  Height: 5' 5 (165.1 cm) Weight: 75.4 kg (166 lb 3.6 oz) IBW/kg (Calculated) : 57  Temp (24hrs), Avg:98.3 F (36.8 C), Min:97.8 F (36.6 C), Max:98.7 F (37.1 C)  Recent Labs  Lab 01/28/24 0454 01/29/24 0450 01/29/24 1019 01/30/24 0429 01/31/24 0502 02/01/24 0454  WBC 17.8*  --  12.3* 13.6* 13.2* 14.5*  CREATININE 1.26* 1.22*  --  1.04* 1.18* 1.62*    Estimated Creatinine Clearance: 36.6 mL/min (A) (by C-G formula based on SCr of 1.62 mg/dL (H)).    No Known Allergies  Antimicrobials this admission: Ceftriaxone  1/31 > 2/4 Cefepime  2/6 >   Dose adjustments this admission:  Microbiology results: 1/31 bld: ng 1/31 ascitic-ng MRSA pcr - 2/6 bldx2: sent 2/6 UCx:  2/6 Resp Cx:  2/6 COVID neg  Thank you for allowing pharmacy to be a part of this patient's care.  Harlene Barlow, [name] [name], BCCP Clinical Pharmacist  02/01/2024 2:57 PM   [facility] pharmacy phone numbers are listed on [email or web address]

## 2024-02-01 NOTE — Plan of Care (Signed)

## 2024-02-01 NOTE — Procedures (Signed)
 Paracentesis Procedure Note  Michelle Michael  [id number]  [birth year]  Date:02/01/24  Time:4:56 PM   Provider Performing:Michelle Michael    Procedure: Paracentesis with imaging guidance (50916)  Indication(s) Ascites  Consent Risks of the procedure as well as the alternatives and risks of each were explained to the patient and/or caregiver.  Consent for the procedure was obtained and is signed in the bedside chart  Anesthesia Topical only with 1% lidocaine     Time Out Verified patient identification, verified procedure, site/side was marked, verified correct patient position, special equipment/implants available, medications/allergies/relevant history reviewed, required imaging and test results available.   Sterile Technique Maximal sterile technique including full sterile barrier drape, hand hygiene, sterile gown, sterile gloves, mask, hair covering, sterile ultrasound probe cover (if used).   Procedure Description Ultrasound used to identify appropriate peritoneal anatomy for placement and overlying skin marked.  Area of drainage cleaned and draped in sterile fashion. Lidocaine  was used to anesthetize the skin and subcutaneous tissue.  20 cc's of yellow appearing fluid was drained. Catheter then removed and bandaid applied to site.   Complications/Tolerance None; patient tolerated the procedure well.   EBL Minimal   Specimen(s) Peritoneal fluid  Michelle Michael Pulmonary & Critical Care 02/01/2024, 4:57 PM  Please see [email or web address] for pager details.  From 7A-7P if no response, please call [phone]. After hours, please call ELink [phone].

## 2024-02-01 NOTE — Progress Notes (Addendum)
 PROGRESS NOTE    Michelle Michael  FMW:[id number] DOB: 08-Jan-1961 DOA: 01/25/2024 PCP: Melvenia Manus BRAVO, MD   Brief Narrative:  63 y.o. female with medical history significant for alcoholic cirrhosis of liver, COPD, type 2 diabetes mellitus, GERD, hypertension, dyslipidemia, depression and anxiety presented with weakness and chills along with jaundice.  She was found to have new alcoholic liver cirrhosis with ascites.  GI was consulted.  She underwent paracentesis on 01/26/2024.  She was transfused 1 unit packed red cell transfusion on 01/27/2024.  She underwent EGD on 01/27/2024 which showed portal hypertensive gastropathy and nonbleeding gastric ulcer with a clean ulcer base and GI recommended outpatient colonoscopy.  She was also started on lactulose  and rifaximin  for hepatic encephalopathy.  She was transferred to ICU on 01/31/2024 for worsening hypotension and hypoxia and started on Levophed  drip.  Assessment & Plan:   Shock -Unclear if this is from volume overload.  She was transferred to ICU on 01/31/2024 for worsening hypotension and hypoxia and started on Levophed  drip -Blood pressure still on the lower side.  Will give IV albumin . -Check blood cultures, UA and urine culture.  Start cefepime .  Chest x-ray on 01/31/2024 had shown findings related to multifocal pneumonia, pulmonary edema and/or hemorrhage.  I have discussed with Dr. Rolan Smith/PCCM at [facility] who recommended transfer to [facility] to ICU under PCCM service.  [facility] aware.  Acute respiratory failure with hypoxia -Respiratory status worsening and currently requiring 15 L high flow nasal cannula oxygen.  Check ABG.  Tried BiPAP, if patient does not improve, will intubate the patient.  Decompensated alcoholic cirrhosis of liver with portal hypertension and ascites Probable hepatic encephalopathy Thrombocytopenia Elevated LFTs Severe hypoalbuminemia -Underwent paracentesis on 01/26/2024: Analysis not consistent  with infection.  Completed 5 days of antibiotics: Last dose on 01/30/2024 -Patient still slow to respond.  GI following.  Continue rifaximin , lactulose  - Hold Lasix  and spironolactone  if blood pressure does not allow. -Strict input and output.  Daily weights.  Fluid restriction.  Acute on chronic symptomatic anemia with concerns per upper GI bleeding -Required 1 unit packed red cell transfusion on 01/27/2024 -underwent EGD on 01/27/2024 which showed portal hypertensive gastropathy and nonbleeding gastric ulcer with a clean ulcer base and GI recommended outpatient colonoscopy.  Continue Protonix .  GI following.  Alcohol  abuse -Patient was counseled regarding alcohol  cessation by prior hospitalist.  Unfortunately, patient is not ready to quit drinking alcohol  -Continue CIWA protocol along with folic acid , multivitamin and thiamine   Leukocytosis -monitor  Acute respiratory failure with hypoxia COPD -Respiratory status worsening.  Currently on   6 L oxygen via high flow nasal cannula. chest x-ray  as above. -Continue current nebs  Anxiety and depression -Hold BuSpar , trazodone  and Cymbalta  today as patient is still very slow to respond.  Outpatient follow-up with PCP/psychiatry  Dyslipidemia -Outpatient follow-up  Diabetes mellitus type 2 with peripheral neuropathy -Continue current insulin  regimen along with CBGs with SSI and carb modified diet  Generalized weakness -PT recommending SNF.  TOC following.  Hypotension -Blood pressure intermittently on the lower side.  Continue midodrine   Accidental right arm dog scratches -patient says dog was vaccinated and it was a scratch not bite -Patient believes that her tetanus is up-to-date -Continue close monitoring.  No superimposed infection or drainage appreciated.  DVT prophylaxis: SCDs Code Status: Full Family Communication: None at bedside Disposition Plan: Status is: Inpatient Remains inpatient appropriate because: Of severity of  illness    Consultants: GI  Procedures:  As above  Antimicrobials:  Anti-infectives (From admission, onward)    Start     Dose/Rate Route Frequency Ordered Stop   01/30/24 1345  rifaximin  (XIFAXAN ) tablet 550 mg        550 mg Oral 2 times daily 01/30/24 1252     01/26/24 1200  cefTRIAXone  (ROCEPHIN ) 1 g in sodium chloride  0.9 % 100 mL IVPB        1 g 200 mL/hr over 30 Minutes Intravenous Every 24 hours 01/26/24 0932 01/30/24 2031   01/26/24 0400  piperacillin -tazobactam (ZOSYN ) IVPB 3.375 g  Status:  Discontinued        3.375 g 12.5 mL/hr over 240 Minutes Intravenous Every 8 hours 01/25/24 2045 01/26/24 0932   01/25/24 2045  piperacillin -tazobactam (ZOSYN ) IVPB 3.375 g        3.375 g 100 mL/hr over 30 Minutes Intravenous  Once 01/25/24 2044 01/25/24 2150        Subjective: Patient seen and examined at bedside.  No fever, agitation, vomiting or worsening abdominal pain reported.  Objective: Vitals:   02/01/24 0245 02/01/24 0500 02/01/24 0715 02/01/24 0730  BP: (!) 108/36  (!) 81/26   Pulse: (!) 116  (!) 117   Resp: (!) 23     Temp:  98.2 F (36.8 C)    TempSrc:  Oral    SpO2: 97%   99%  Weight:  75.4 kg    Height:        Intake/Output Summary (Last 24 hours) at 02/01/2024 0756 Last data filed at 01/31/2024 2148 Gross per 24 hour  Intake 80.68 ml  Output --  Net 80.68 ml   Filed Weights   01/25/24 1528 01/31/24 1615 02/01/24 0500  Weight: 75.2 kg 76.7 kg 75.4 kg    Examination:  General: On 6 L oxygen via high flow nasal cannula.  No distress.  Chronically ill and deconditioned looking. ENT/neck: No thyromegaly.  JVD is not elevated  respiratory: Decreased breath sounds at bases bilaterally with some crackles; no wheezing  CVS: S1-S2 heard, intermittently tachycardic Abdominal: Soft, nontender, slightly distended; no organomegaly, bowel sounds are heard Extremities: Lower extremity edema bilaterally; no cyanosis  CNS: Wakes up slightly, still extremely slow  to respond.  Poor historian.  No focal neurologic deficit.  Moves extremities Lymph: No obvious lymphadenopathy Skin: No obvious ecchymosis/lesions  psych: Extremely flat affect.  Not agitated. musculoskeletal: No obvious joint swelling/deformity   Data Reviewed: I have personally reviewed following labs and imaging studies  CBC: Recent Labs  Lab 01/25/24 1635 01/26/24 0338 01/28/24 0454 01/29/24 1019 01/30/24 0429 01/31/24 0502 02/01/24 0454  WBC 16.8*   < > 17.8* 12.3* 13.6* 13.2* 14.5*  NEUTROABS 13.3*  --   --  10.1* 10.5*  --  12.0*  HGB 9.2*   < > 9.2* 8.8* 8.9* 9.5* 9.4*  HCT 27.7*   < > 27.9* 26.9* 27.3* 28.7* 27.9*  MCV 110.8*   < > 105.3* 106.7* 107.5* 106.7* 106.9*  PLT 134*   < > 110* 103* 108* 109* 146*   < > = values in this interval not displayed.   Basic Metabolic Panel: Recent Labs  Lab 01/26/24 1226 01/27/24 0547 01/28/24 0454 01/29/24 0450 01/30/24 0429 01/31/24 0502 02/01/24 0454  NA  --  130* 132* 132* 134* 134* 135  K  --  3.8 3.6 3.7 3.8 4.2 4.3  CL  --  96* 97* 98 98 99 100  CO2  --  25 23 26 27 26  26  GLUCOSE  --  144* 152* 161* 173* 121* 143*  BUN  --  16 19 22 23  29* 38*  CREATININE  --  1.06* 1.26* 1.22* 1.04* 1.18* 1.62*  CALCIUM   --  7.3* 7.7* 8.0* 8.5* 8.4* 8.8*  MG 0.9* 2.0  --   --   --   --  2.1  PHOS  --   --  2.4*  --   --   --   --    GFR: Estimated Creatinine Clearance: 36.6 mL/min (A) (by C-G formula based on SCr of 1.62 mg/dL (H)). Liver Function Tests: Recent Labs  Lab 01/28/24 1109 01/29/24 0450 01/30/24 0429 01/31/24 0502 02/01/24 0454  AST 46* 40 52* 64* 76*  ALT 23 22 24 25 28   ALKPHOS 156* 138* 148* 142* 136*  BILITOT 5.9* 4.9* 4.6* 4.8* 5.4*  PROT 5.4* 5.1* 5.3* 5.3* 5.4*  ALBUMIN  2.0* 1.8* 1.9* 1.9* 1.9*   Recent Labs  Lab 01/25/24 1635  LIPASE 34   Recent Labs  Lab 01/25/24 1635 01/28/24 1109 01/30/24 0921 01/31/24 1631  AMMONIA 31 52* 46* 33   Coagulation Profile: Recent Labs  Lab  01/27/24 0547 01/28/24 0454 01/29/24 1019 01/30/24 0429 01/31/24 1233  INR 1.8* 1.7* 1.8* 1.8* 1.9*   Cardiac Enzymes: No results for input(s): CKTOTAL, CKMB, CKMBINDEX, TROPONINI in the last 168 hours. BNP (last 3 results) No results for input(s): PROBNP in the last 8760 hours. HbA1C: No results for input(s): HGBA1C in the last 72 hours. CBG: Recent Labs  Lab 01/31/24 1635 01/31/24 2001 01/31/24 2354 02/01/24 0419 02/01/24 0730  GLUCAP 103* 116* 122* 145* 136*   Lipid Profile: No results for input(s): CHOL, HDL, LDLCALC, TRIG, CHOLHDL, LDLDIRECT in the last 72 hours. Thyroid  Function Tests: No results for input(s): TSH, T4TOTAL, FREET4, T3FREE, THYROIDAB in the last 72 hours. Anemia Panel: No results for input(s): VITAMINB12, FOLATE, FERRITIN, TIBC, IRON, RETICCTPCT in the last 72 hours. Sepsis Labs: No results for input(s): PROCALCITON, LATICACIDVEN in the last 168 hours.  Recent Results (from the past 240 hours)  Respiratory (~20 pathogens) panel by PCR     Status: None   Collection Time: 01/26/24 11:12 AM   Specimen: Nasopharyngeal Swab; Respiratory  Result Value Ref Range Status   Adenovirus NOT DETECTED NOT DETECTED Final   Coronavirus 229E NOT DETECTED NOT DETECTED Final    Comment: (NOTE) The Coronavirus on the Respiratory Panel, DOES NOT test for the novel  Coronavirus (2019 nCoV)    Coronavirus HKU1 NOT DETECTED NOT DETECTED Final   Coronavirus NL63 NOT DETECTED NOT DETECTED Final   Coronavirus OC43 NOT DETECTED NOT DETECTED Final   Metapneumovirus NOT DETECTED NOT DETECTED Final   Rhinovirus / Enterovirus NOT DETECTED NOT DETECTED Final   Influenza A NOT DETECTED NOT DETECTED Final   Influenza B NOT DETECTED NOT DETECTED Final   Parainfluenza Virus 1 NOT DETECTED NOT DETECTED Final   Parainfluenza Virus 2 NOT DETECTED NOT DETECTED Final   Parainfluenza Virus 3 NOT DETECTED NOT DETECTED Final    Parainfluenza Virus 4 NOT DETECTED NOT DETECTED Final   Respiratory Syncytial Virus NOT DETECTED NOT DETECTED Final   Bordetella pertussis NOT DETECTED NOT DETECTED Final   Bordetella Parapertussis NOT DETECTED NOT DETECTED Final   Chlamydophila pneumoniae NOT DETECTED NOT DETECTED Final   Mycoplasma pneumoniae NOT DETECTED NOT DETECTED Final    Comment: Performed at [facility], [address], [place], [place] [address]  Culture, blood (Routine X 2) w Reflex  to ID Panel     Status: None   Collection Time: 01/26/24 12:26 PM   Specimen: BLOOD  Result Value Ref Range Status   Specimen Description BLOOD RIGHT ANTECUBITAL  Final   Special Requests   Final    Blood Culture adequate volume BOTTLES DRAWN AEROBIC AND ANAEROBIC   Culture   Final    NO GROWTH 5 DAYS Performed at [facility], [address], [place], [place] [address]    Report Status 01/31/2024 FINAL  Final  Culture, blood (Routine X 2) w Reflex to ID Panel     Status: None   Collection Time: 01/26/24 12:26 PM   Specimen: BLOOD  Result Value Ref Range Status   Specimen Description BLOOD LEFT ANTECUBITAL  Final   Special Requests   Final    BOTTLES DRAWN AEROBIC AND ANAEROBIC Blood Culture adequate volume   Culture   Final    NO GROWTH 5 DAYS Performed at [facility], [address], [place], [place] [address]    Report Status 01/31/2024 FINAL  Final  Acid Fast Smear (AFB)     Status: None   Collection Time: 01/26/24  3:18 PM  Result Value Ref Range Status   AFB Specimen Processing Concentration  Final   Acid Fast Smear Negative  Final    Comment: (NOTE) Performed At: [facility] [address] [place], [place] [id number] Jennette Shorter MD Ey:[id number]    Source (AFB) ASCITIC  Final    Comment: Performed at [facility], [address], [place], [place] [address]  Gram stain     Status: None   Collection Time: 01/26/24  3:18 PM   Specimen: Ascitic  Result Value Ref Range Status   Specimen  Description ASCITIC  Final   Special Requests NONE  Final   Gram Stain   Final    PERITONEAL CYTOSPIN SMEAR NO ORGANISMS SEEN WBC PRESENT,BOTH PMN AND MONONUCLEAR Performed at [facility], [address], [place], [place] [address]    Report Status 01/26/2024 FINAL  Final  Culture, body fluid w Gram Stain-bottle     Status: None   Collection Time: 01/26/24  3:18 PM   Specimen: Ascitic  Result Value Ref Range Status   Specimen Description ASCITIC  Final   Special Requests BOTTLES DRAWN AEROBIC AND ANAEROBIC 10CC  Final   Culture   Final    NO GROWTH 5 DAYS Performed at [facility], [address] [place], [place] [address]    Report Status 01/31/2024 FINAL  Final  MRSA Next Gen by PCR, Nasal     Status: None   Collection Time: 01/31/24  4:00 PM   Specimen: Nasal Mucosa; Nasal Swab  Result Value Ref Range Status   MRSA by PCR Next Gen NOT DETECTED NOT DETECTED Final    Comment: (NOTE) The GeneXpert MRSA Assay (FDA approved for NASAL specimens only), is one component of a comprehensive MRSA colonization surveillance program. It is not intended to diagnose MRSA infection nor to guide or monitor treatment for MRSA infections. Test performance is not FDA approved in patients less than 6 years old. Performed at [facility], [address], [place], [place] [address]          Radiology Studies: DG CHEST PORT 1 VIEW Result Date: 01/31/2024 CLINICAL DATA:  Dyspnea EXAM: PORTABLE CHEST 1 VIEW COMPARISON:  Chest x-ray 01/25/2024 FINDINGS: Diffuse bilateral airspace opacities are seen, predominantly centrally. This is most significant in the right upper lobe. There is no pleural effusion  or pneumothorax. The cardiomediastinal silhouette is within normal limits. No acute fractures are seen. IMPRESSION: Diffuse bilateral airspace opacities, predominantly centrally. This is most significant in the right upper lobe. Findings may be related to multifocal pneumonia, pulmonary edema and/or  hemorrhage. Electronically Signed   By: Greig Pique M.D.   On: 01/31/2024 18:06   US  EKG SITE RITE Result Date: 01/31/2024 If Site Rite image not attached, placement could not be confirmed due to current cardiac rhythm.       Scheduled Meds:  arformoterol   15 mcg Nebulization BID   budesonide  (PULMICORT ) nebulizer solution  0.5 mg Nebulization BID   busPIRone   7.5 mg Oral TID   Chlorhexidine  Gluconate Cloth  6 each Topical Q0600   DULoxetine   60 mg Oral Daily   fluticasone   2 spray Each Nare Daily   folic acid   1 mg Oral Daily   furosemide   40 mg Oral Daily   gabapentin   300 mg Oral TID   insulin  aspart  0-6 Units Subcutaneous Q4H   insulin  aspart protamine- aspart  15 Units Subcutaneous BID WC   lactulose   20 g Oral BID   magnesium  oxide  400 mg Oral Daily   midodrine   10 mg Oral TID WC   multivitamin with minerals  1 tablet Oral Daily   pantoprazole  (PROTONIX ) IV  40 mg Intravenous Q12H   rifaximin   550 mg Oral BID   sodium chloride  flush  10-40 mL Intracatheter Q12H   spironolactone   100 mg Oral Daily   thiamine   100 mg Oral Daily   Or   thiamine   100 mg Intravenous Daily   traZODone   100 mg Oral QHS   Continuous Infusions:  norepinephrine  (LEVOPHED ) Adult infusion 7 mcg/min (01/31/24 2012)          Sophie Mao, MD [facility] 02/01/2024, 7:56 AM

## 2024-02-01 NOTE — Consult Note (Signed)
 [facility] Liaison Note  02/01/2024  Quintasha Gren 03-03-1961 [id number]  Location: RN Hospital Liaison screened the patient remotely at [facility].  Insurance: [facility]   [address] Brass is a 63 y.o. female who is a Primary Care Patient of Dixon, Manus BRAVO, MD [facility]. The patient was screened for readmission hospitalization with noted extreme risk score for unplanned readmission risk with 1 IP in 6 months.  The patient was assessed for potential Care Management service needs for post hospital transition for care coordination. Review of patient's electronic medical record reveals patient was admitted for Generalized Weakness. Pt will transfer to [facility] today for ongoing care.  Plan: [facility] Liaison will continue to follow progress and disposition to asess for post hospital community care coordination/management needs.  Referral request for community care coordination: pending disposition.   VBCI Care Management/Population Health does not replace or interfere with any arrangements made by the Inpatient Transition of Care team.   For questions contact:   Olam Ku, RN, [facility] Liaison [place]   [facility], Population Health Office Hours MTWF  8:00 am-6:00 pm Direct Dial: [phone] mobile [phone] [Office toll free line] Office Hours are M-F 8:30 - 5 pm [email or web address]

## 2024-02-01 NOTE — H&P (Signed)
 NAME:  Michelle Michael, MRN:  [id number], DOB:  Aug 12, 1961, LOS: 7 ADMISSION DATE:  01/25/2024, CONSULTATION DATE:  2/6 REFERRING MD:  Dr. Cheryle, CHIEF COMPLAINT:  shock; respiratory failure   History of Present Illness:  Patient is a 63 year old female with pertinent PMH alcoholic cirrhosis, COPD, T2DM, GERD, HTN, HLD, depression/anxiety presents to [facility] ED on 1/30 with weakness/chills.  Patient having symptoms of weakness and chills.  Also noted some jaundice.  Came to [facility] ED on 1/30 for further workup. BP soft. Found to have new alcoholic liver cirrhosis with ascites.  GI consulted.  Patient had paracentesis on 1/31.  On 3/1 noted to have drop in hemoglobin and was given 1 unit of PRBCs.  Underwent EGD on 2/1 showing portal hypertensive gastropathy and nonbleeding gastric ulcer.  Patient started on lactulose  and rifaximin  for hepatic encephalopathy.  On 2/5 patient with worsening hypotension and hypoxia.  Patient required levo.  On 15L . CXR showing diffuse b/l opacities mostly in RUL; possible aspiration? On 2/6 patient with increasing pressor requirements and worsening hypoxia now on bipap.  Cultures obtained and started on broad-spectrum antibiotics. UA w/ moderate leukocytes. PCCM consulted for transfer to [facility] ICU.   Pertinent  Medical History   Past Medical History:  Diagnosis Date   Anemia    Anxiety    Centrilobular emphysema (HCC) 03/22/2023   Cirrhosis (HCC)    CKD (chronic kidney disease)    COPD (chronic obstructive pulmonary disease) (HCC)    Depression    Diabetes mellitus, type II (HCC)    GERD (gastroesophageal reflux disease)    Hyperlipidemia    Hypertension    Hypokalemia    Hyponatremia    Hypoxemia 01/26/2024   Neuromuscular disorder (HCC) 2021   Substance abuse (HCC)    Years ago   Ulcer 1980s     Significant Hospital Events: Including procedures, antibiotic start and stop dates in addition to other pertinent events   1/30 admitted to [facility] liver  cirrhosis 2/6 transferred to [facility] w/ septic shock on levo and respiratory failure on bipap  Interim History / Subjective:  On levo 15 Bipap 70% Lethargic but arousable and able to follow commands Afebrile and wbc 14.5 from 13.2   Objective   Blood pressure (!) 120/40, pulse (!) 120, temperature 98.4 F (36.9 C), temperature source Oral, resp. rate (!) 23, height 5' 5 (1.651 m), weight 75.4 kg, SpO2 97%.    FiO2 (%):  [70 %] 70 %   Intake/Output Summary (Last 24 hours) at 02/01/2024 1226 Last data filed at 02/01/2024 1128 Gross per 24 hour  Intake 693.42 ml  Output 75 ml  Net 618.42 ml   Filed Weights   01/25/24 1528 01/31/24 1615 02/01/24 0500  Weight: 75.2 kg 76.7 kg 75.4 kg    Examination: General:  ill appearing female on bipap HEENT: MM pink/moist; bipap in place Neuro: lethargic but opens eyes to voice; follows commands; mae; perrl CV: s1s2, RRR, no m/r/g PULM:  dim clear BS bilaterally; bipap 70% sats 100% GI: soft, bsx4 active  Extremities: warm/dry, no edema  Skin: no rashes or lesions    Resolved Hospital Problem list     Assessment & Plan:   Shock: likely sepsis 2/2 multifocal pna (aspiration?) and Possible UTI Plan: -cont levo for map goal >65 -broad spectrum abx -midodrine  when able to take po -albumin  -trend wbc/fever curve -follow cultures -PCT  Acute respiratory failure w/ hypoxia Multifocal pna: possible aspiration Hx of COPD Plan: -cont bipap;  wean fio2 for sats >92% -if sats/wob improving consider trying on [place] later today -check abg -abx as above -pulm toiletry -triple therapy nebs  Acute decompensated alcoholic cirrhosis with ascites Alcoholic hepatitis -Paracentesis on 1/31 (so far para cultures negative) Plan: -GI following; appreciate recs -trend lfts and coags -cont albumin  -cont rifaxamin and lactulose  -PPI bid -hold lasix  and spironolactone  with hypotension  Acute encephalopathy: In setting of hepatic  encephalopathy and alcohol  withdrawal Plan: -Continue lactulose ; trend ammonia -Limit sedating meds -Continue rifaximin  -Check ABG to rule out CO2 retention -Thiamine , folic acid , MVI  AKI  Plan: -albumin  -Trend BMP / urinary output -Replace electrolytes as indicated -Avoid nephrotoxic agents, ensure adequate renal perfusion  T2DM Plan: -ssi and cbg monitoring  Acute on chronic anemia Thrombocytopenia: likely in setting of sepis and liver failure Plan: -Trend CBC transfuse per protocol -PPI twice daily  Anxiety/depression Plan: -hold home meds with ams    Best Practice (right click and Reselect all SmartList Selections daily)   Diet/type: NPO w/ oral meds DVT prophylaxis SCD Pressure ulcer(s): N/A GI prophylaxis: PPI Lines: N/A Foley:  Yes, and it is still needed Code Status:  full code Last date of multidisciplinary goals of care discussion [2/6 attempted to contact Alyse Golas over phone but no answer]  Labs   CBC: Recent Labs  Lab 01/25/24 1635 01/26/24 0338 01/28/24 0454 01/29/24 1019 01/30/24 0429 01/31/24 0502 02/01/24 0454  WBC 16.8*   < > 17.8* 12.3* 13.6* 13.2* 14.5*  NEUTROABS 13.3*  --   --  10.1* 10.5*  --  12.0*  HGB 9.2*   < > 9.2* 8.8* 8.9* 9.5* 9.4*  HCT 27.7*   < > 27.9* 26.9* 27.3* 28.7* 27.9*  MCV 110.8*   < > 105.3* 106.7* 107.5* 106.7* 106.9*  PLT 134*   < > 110* 103* 108* 109* 146*   < > = values in this interval not displayed.    Basic Metabolic Panel: Recent Labs  Lab 01/26/24 1226 01/27/24 0547 01/28/24 0454 01/29/24 0450 01/30/24 0429 01/31/24 0502 02/01/24 0454  NA  --  130* 132* 132* 134* 134* 135  K  --  3.8 3.6 3.7 3.8 4.2 4.3  CL  --  96* 97* 98 98 99 100  CO2  --  25 23 26 27 26 26   GLUCOSE  --  144* 152* 161* 173* 121* 143*  BUN  --  16 19 22 23  29* 38*  CREATININE  --  1.06* 1.26* 1.22* 1.04* 1.18* 1.62*  CALCIUM   --  7.3* 7.7* 8.0* 8.5* 8.4* 8.8*  MG 0.9* 2.0  --   --   --   --  2.1  PHOS  --   --   2.4*  --   --   --   --    GFR: Estimated Creatinine Clearance: 36.6 mL/min (A) (by C-G formula based on SCr of 1.62 mg/dL (H)). Recent Labs  Lab 01/29/24 1019 01/30/24 0429 01/31/24 0502 02/01/24 0454  WBC 12.3* 13.6* 13.2* 14.5*    Liver Function Tests: Recent Labs  Lab 01/28/24 1109 01/29/24 0450 01/30/24 0429 01/31/24 0502 02/01/24 0454  AST 46* 40 52* 64* 76*  ALT 23 22 24 25 28   ALKPHOS 156* 138* 148* 142* 136*  BILITOT 5.9* 4.9* 4.6* 4.8* 5.4*  PROT 5.4* 5.1* 5.3* 5.3* 5.4*  ALBUMIN  2.0* 1.8* 1.9* 1.9* 1.9*   Recent Labs  Lab 01/25/24 1635  LIPASE 34   Recent Labs  Lab 01/25/24 1635 01/28/24 1109  01/30/24 0921 01/31/24 1631  AMMONIA 31 52* 46* 33    ABG    Component Value Date/Time   PHART 7.37 02/01/2024 1012   PCO2ART 49 (H) 02/01/2024 1012   PO2ART 82 (L) 02/01/2024 1012   HCO3 28.3 (H) 02/01/2024 1012   O2SAT 98.9 02/01/2024 1012     Coagulation Profile: Recent Labs  Lab 01/27/24 0547 01/28/24 0454 01/29/24 1019 01/30/24 0429 01/31/24 1233  INR 1.8* 1.7* 1.8* 1.8* 1.9*    Cardiac Enzymes: No results for input(s): CKTOTAL, CKMB, CKMBINDEX, TROPONINI in the last 168 hours.  HbA1C: Hemoglobin A1C  Date/Time Value Ref Range Status  10/27/2023 12:00 AM 4.9  Final  09/28/2023 10:52 AM 501.0 (A) 4.0 - 5.6 % Final  11/25/2022 10:11 AM 5.7 (A) 4.0 - 5.6 % Final  06/24/2021 12:00 AM 6.1  Final   HbA1c, POC (controlled diabetic range)  Date/Time Value Ref Range Status  07/25/2022 10:23 AM 5.9 0.0 - 7.0 % Final  03/23/2022 11:13 AM 5.6 0.0 - 7.0 % Final    CBG: Recent Labs  Lab 01/31/24 1635 01/31/24 2001 01/31/24 2354 02/01/24 0419 02/01/24 0730  GLUCAP 103* 116* 122* 145* 136*    Review of Systems:   Patient is encephalopathic and on bipap; therefore, history has been obtained from chart review.    Past Medical History:  She,  has a past medical history of Anemia, Anxiety, Centrilobular emphysema (HCC)  (03/22/2023), Cirrhosis (HCC), CKD (chronic kidney disease), COPD (chronic obstructive pulmonary disease) (HCC), Depression, Diabetes mellitus, type II (HCC), GERD (gastroesophageal reflux disease), Hyperlipidemia, Hypertension, Hypokalemia, Hyponatremia, Hypoxemia (01/26/2024), Neuromuscular disorder (HCC) (2021), Substance abuse (HCC), and Ulcer (1980s).   Surgical History:   Past Surgical History:  Procedure Laterality Date   BIOPSY  02/10/2023   Procedure: BIOPSY;  Surgeon: Cindie Carlin POUR, DO;  Location: [facility];  Service: Endoscopy;;   CESAREAN SECTION  1984   CHOLECYSTECTOMY     ESOPHAGEAL BRUSHING  02/10/2023   Procedure: ESOPHAGEAL BRUSHING;  Surgeon: Cindie Carlin POUR, DO;  Location: [facility];  Service: Endoscopy;;   ESOPHAGOGASTRODUODENOSCOPY (EGD) WITH PROPOFOL  N/A 02/10/2023   Procedure: ESOPHAGOGASTRODUODENOSCOPY (EGD) WITH PROPOFOL ;  Surgeon: Cindie Carlin POUR, DO;  Location: [facility];  Service: Endoscopy;  Laterality: N/A;  8:45 am   ESOPHAGOGASTRODUODENOSCOPY (EGD) WITH PROPOFOL  N/A 01/27/2024   Procedure: ESOPHAGOGASTRODUODENOSCOPY (EGD) WITH PROPOFOL ;  Surgeon: Cindie Carlin POUR, DO;  Location: [facility];  Service: Endoscopy;  Laterality: N/A;   FRACTURE SURGERY  2005   ORTHOPEDIC SURGERY     SPINE SURGERY  2003   TUBAL LIGATION  1993     Social History:   reports that she has been smoking cigarettes. She has a 40 pack-year smoking history. She has been exposed to tobacco smoke. She has never used smokeless tobacco. She reports current alcohol  use of about 10.0 standard drinks of alcohol  per week. She reports that she does not use drugs.   Family History:  Her family history includes Alcohol  abuse in her mother; Arthritis in her father; Cancer in her daughter and mother; Diabetes in her mother; Drug abuse in her son; Heart disease in her father; Hypertension in her father and mother; Obesity in her daughter. There is no history of Breast cancer.    Allergies No Known Allergies   Home Medications  Prior to Admission medications   Medication Sig Start Date End Date Taking? Authorizing Provider  busPIRone  (BUSPAR ) 10 MG tablet Take 10 mg by mouth 3 (three) times  daily. 05/31/21  Yes [provider]  DULoxetine  (CYMBALTA ) 60 MG capsule Take 60 mg by mouth daily. 12/08/23  Yes [provider]  fluticasone  (FLONASE ) 50 MCG/ACT nasal spray USE 2 SPRAYS IN EACH NOSTRIL EVERY DAY 01/22/24  Yes Melvenia Manus BRAVO, MD  furosemide  (LASIX ) 40 MG tablet Take 1 tablet (40 mg total) by mouth 2 (two) times daily. 12/13/23 03/12/24 Yes Mahon, Charmaine CROME, NP  gabapentin  (NEURONTIN ) 300 MG capsule Take 1 capsule (300 mg total) by mouth once for 1 dose. Patient taking differently: Take 300 mg by mouth once. 12/21/23 01/26/24 Yes Melvenia Manus BRAVO, MD  gabapentin  (NEURONTIN ) 600 MG tablet Take 1 tablet (600 mg total) by mouth 3 (three) times daily. 12/21/23  Yes Melvenia Manus BRAVO, MD  metFORMIN  (GLUCOPHAGE -XR) 500 MG 24 hr tablet Take 1 tablet (500 mg total) by mouth daily with breakfast. 09/28/23  Yes Reardon, Benton PARAS, NP  ondansetron  (ZOFRAN ) 4 MG tablet Take 1 tablet (4 mg total) by mouth every 8 (eight) hours as needed. 12/21/23  Yes Melvenia Manus BRAVO, MD  pantoprazole  (PROTONIX ) 40 MG tablet Take 1 tablet (40 mg total) by mouth 2 (two) times daily. 12/13/23 12/12/24 Yes Mahon, Charmaine CROME, NP  Potassium Chloride  40 MEQ/15ML (20%) SOLN Take 7.5 mLs by mouth daily. 12/21/23  Yes Melvenia Manus BRAVO, MD  REPATHA  140 MG/ML SOSY INJECT 140MG  UNDER THE SKIN EVERY 14 DAYS 01/17/24  Yes Melvenia Manus BRAVO, MD  spironolactone  (ALDACTONE ) 100 MG tablet Take 1 tablet (100 mg total) by mouth daily. 12/13/23  Yes Kennedy Charmaine CROME, NP  STIOLTO RESPIMAT  2.5-2.5 MCG/ACT AERS INHALE 2 PUFFS BY MOUTH ONCE DAILY 01/18/24  Yes Melvenia Manus BRAVO, MD  tirzepatide  (MOUNJARO ) 2.5 MG/0.5ML Pen INJECT 2.5MG  (1 PEN) UNDER THE SKIN EVERY WEEK 01/10/24  Yes Melvenia Manus BRAVO, MD   traZODone  (DESYREL ) 100 MG tablet Take 100 mg by mouth at bedtime. 12/08/23  Yes [provider]  VENTOLIN  HFA 108 (90 Base) MCG/ACT inhaler INHALE 2 PUFFS BY MOUTH EVERY 6 HOURS AS NEEDED FOR WHEEZING OR SHORTNESS OF BREATH 12/21/23  Yes Melvenia Manus BRAVO, MD  Continuous Glucose Receiver (FREESTYLE LIBRE 3 READER) DEVI 1 each by Other route See admin instructions. 12/21/23   Therisa Benton PARAS, NP  Continuous Glucose Sensor (FREESTYLE LIBRE 3 PLUS SENSOR) MISC Change sensor every 15 days. 09/28/23   Therisa Benton PARAS, NP  folic acid  (FOLVITE ) 400 MCG tablet Take 400 mcg by mouth daily. Patient not taking: Reported on 01/26/2024    [provider]  Glucagon, rDNA, (GLUCAGON EMERGENCY) 1 MG KIT Inject 1 mg into the muscle as needed (low blood sugar). 08/02/21   [provider]  insulin  isophane & regular human KwikPen (NOVOLIN  70/30 KWIKPEN) (70-30) 100 UNIT/ML KwikPen Inject 15 Units into the skin in the morning and at bedtime. 09/28/23   Therisa Benton PARAS, NP  Insulin  Pen Needle (BD PEN NEEDLE NANO 2ND GEN) 32G X 4 MM MISC Use to inject insulin  and Victoza  for 3 injections per day 09/13/23   Therisa Benton PARAS, NP  INSULIN  SYRINGE .5CC/29G 29G X 1/2 0.5 ML MISC Use to inject insulin  twice daily. 09/13/23   Therisa Benton PARAS, NP     Critical care time: 45 minutes    JD [name] RIGGERS [place] [facility] 02/01/2024, 12:26 PM  Please see [email or web address] for pager details.  From 7A-7P if no response, please call [phone]. After hours, please call ELink [phone].

## 2024-02-01 NOTE — Progress Notes (Signed)
 Nurse to nurse report given to Glenys RN on 2H at [facility]. [facility] present to transport [facility] from [facility] to [facility]. At time of transfer, patient on Bipap FiO2 70%, Levophed  infusing at 15mcg and foley cath was emptied just prior to IV lasix  push being given. Foley cath and Flexiseal clean, dry and intact with no drainage noted. Double lumen PICC line patent with both ports drawing back blood and flushing well. Patient alert to self, place and year but not to month or situation. Since 7am, patient has required an increase in O2 up to 15L hiflo and levo being increased up to 15mcg to stabilize blood pressure with map >65. Dr Cheryle made aware. Patient noted to cough at times both after drinking something and when not eating or drinking. Respiratory panel collected. [facility] at [facility] to collect COVID test due to lab stating we are out of them currently. Patient friend, Nancyann present at time of transfer and updated by clinical research associate. Nancyann stated patient does have children but has not spoken with them in years. Passed this in report also for further evaluation.

## 2024-02-01 NOTE — Progress Notes (Signed)
 SLP Cancellation Note  Patient Details Name: Michelle Michael MRN: [id number] DOB: [birth year]   Cancelled treatment:       Reason Eval/Treat Not Completed: Medical issues which prohibited therapy. Pt currently requiring BiPAP. SLP will follow for readiness to participate in a swallowing evaluation.    Damien Blumenthal, M.A., CF-SLP Speech Language Pathology, Acute Rehabilitation Services  Secure Chat preferred [phone]  02/01/2024, 12:45 PM

## 2024-02-01 NOTE — Progress Notes (Signed)
 Pharmacy Antibiotic Note  Michelle Michael is a 63 y.o. female admitted on 01/25/2024 with sepsis.  Pharmacy has been consulted for vancomycin  dosing.  Already receiving cefepime . PM update: concern for inta-abdominal infection> change cefepime  to zosyn   Plan: Vancomycin  1250 mg IV q 36 hrs for now. Calc AUC 490 w/ Scr 1.62. Stop cefepime   Start zosyn  3.375gm IV q8h EI F/u cultures, renal function and clinical course.  Height: 5' 5 (165.1 cm) Weight: 75.4 kg (166 lb 3.6 oz) IBW/kg (Calculated) : 57  Temp (24hrs), Avg:98.3 F (36.8 C), Min:97.8 F (36.6 C), Max:98.7 F (37.1 C)  Recent Labs  Lab 01/28/24 0454 01/29/24 0450 01/29/24 1019 01/30/24 0429 01/31/24 0502 02/01/24 0454  WBC 17.8*  --  12.3* 13.6* 13.2* 14.5*  CREATININE 1.26* 1.22*  --  1.04* 1.18* 1.62*    Estimated Creatinine Clearance: 36.6 mL/min (A) (by C-G formula based on SCr of 1.62 mg/dL (H)).    No Known Allergies  Antimicrobials this admission: Ceftriaxone  1/31 > 2/4 Cefepime  2/6 > 2/6 Vanc 2/6> Zosyn  2/6>  Dose adjustments this admission:  Microbiology results: 1/31 bld: ng 1/31 ascitic-ng MRSA pcr - 2/6 bldx2: sent 2/6 UCx:  2/6 Resp Cx:  2/6 COVID neg   Olam Chalk Pharm.D. CPP, BCPS Clinical Pharmacist [phone] 02/01/2024 3:46 PM   [facility] pharmacy phone numbers are listed on [email or web address]

## 2024-02-01 NOTE — TOC Progression Note (Signed)
 Transition of Care [facility]) - Progression Note    Patient Details  Name: Michelle Michael MRN: [id number] Date of Birth: 10-19-1961  Transition of Care [facility]) CM/SW Contact  Noreen KATHEE [place], [place] Phone Number: 02/01/2024, 11:18 AM  Clinical Narrative:     Patient is being transferred to [facility] ICU. CSW reached out to [facility] and canceled SNF Auth. TOC signing off on [facility].   Expected Discharge Plan: Skilled Nursing Facility Barriers to Discharge: Continued Medical Work up  Expected Discharge Plan and Services In-house Referral: Clinical Social Work Discharge Planning Services: CM Consult Post Acute Care Choice: Skilled Nursing Facility Living arrangements for the past 2 months: Single Family Home                   Social Determinants of Health (SDOH) Interventions SDOH Screenings   Food Insecurity: No Food Insecurity (01/26/2024)  Housing: Low Risk  (01/26/2024)  Transportation Needs: Unmet Transportation Needs (01/26/2024)  Utilities: Not At Risk (01/26/2024)  Alcohol  Screen: Medium Risk (01/12/2024)  Depression (PHQ2-9): Low Risk  (01/25/2024)  Financial Resource Strain: Low Risk  (01/12/2024)  Physical Activity: Insufficiently Active (01/12/2024)  Social Connections: Socially Isolated (01/26/2024)  Stress: Stress Concern Present (01/12/2024)  Tobacco Use: High Risk (01/26/2024)  Health Literacy: Adequate Health Literacy (01/16/2024)    Readmission Risk Interventions    02/01/2024   11:18 AM 01/29/2024    1:34 PM 01/26/2024    9:20 AM  Readmission Risk Prevention Plan  Transportation Screening Complete Complete Complete  HRI or Home Care Consult  Complete Complete  Social Work Consult for Recovery Care Planning/Counseling  Complete Complete  Palliative Care Screening  Not Applicable Not Applicable  Medication Review Oceanographer) Complete Complete Complete  HRI or Home Care Consult Complete    SW Recovery Care/Counseling Consult Complete    Palliative Care Screening  Not Applicable    Skilled Nursing Facility Complete

## 2024-02-02 DIAGNOSIS — G9340 Encephalopathy, unspecified: Secondary | ICD-10-CM

## 2024-02-02 DIAGNOSIS — D539 Nutritional anemia, unspecified: Secondary | ICD-10-CM

## 2024-02-02 DIAGNOSIS — R17 Unspecified jaundice: Secondary | ICD-10-CM

## 2024-02-02 DIAGNOSIS — J9602 Acute respiratory failure with hypercapnia: Secondary | ICD-10-CM

## 2024-02-02 DIAGNOSIS — E119 Type 2 diabetes mellitus without complications: Secondary | ICD-10-CM

## 2024-02-02 DIAGNOSIS — K7460 Unspecified cirrhosis of liver: Secondary | ICD-10-CM

## 2024-02-02 DIAGNOSIS — J9601 Acute respiratory failure with hypoxia: Secondary | ICD-10-CM | POA: Diagnosis not present

## 2024-02-02 DIAGNOSIS — K7682 Hepatic encephalopathy: Secondary | ICD-10-CM | POA: Diagnosis not present

## 2024-02-02 DIAGNOSIS — K7031 Alcoholic cirrhosis of liver with ascites: Secondary | ICD-10-CM | POA: Diagnosis not present

## 2024-02-02 DIAGNOSIS — K7290 Hepatic failure, unspecified without coma: Secondary | ICD-10-CM | POA: Diagnosis not present

## 2024-02-02 DIAGNOSIS — R6521 Severe sepsis with septic shock: Secondary | ICD-10-CM | POA: Diagnosis not present

## 2024-02-02 DIAGNOSIS — A419 Sepsis, unspecified organism: Secondary | ICD-10-CM

## 2024-02-02 DIAGNOSIS — Z66 Do not resuscitate: Secondary | ICD-10-CM

## 2024-02-02 DIAGNOSIS — D689 Coagulation defect, unspecified: Secondary | ICD-10-CM

## 2024-02-02 DIAGNOSIS — Z7189 Other specified counseling: Secondary | ICD-10-CM

## 2024-02-02 DIAGNOSIS — Z515 Encounter for palliative care: Secondary | ICD-10-CM

## 2024-02-02 LAB — COMPREHENSIVE METABOLIC PANEL
ALT: 23 U/L (ref 0–44)
AST: 74 U/L — ABNORMAL HIGH (ref 15–41)
Albumin: 3.6 g/dL (ref 3.5–5.0)
Alkaline Phosphatase: 92 U/L (ref 38–126)
Anion gap: 15 (ref 5–15)
BUN: 44 mg/dL — ABNORMAL HIGH (ref 8–23)
CO2: 22 mmol/L (ref 22–32)
Calcium: 9 mg/dL (ref 8.9–10.3)
Chloride: 100 mmol/L (ref 98–111)
Creatinine, Ser: 2.03 mg/dL — ABNORMAL HIGH (ref 0.44–1.00)
GFR, Estimated: 27 mL/min — ABNORMAL LOW (ref >60)
Glucose, Bld: 166 mg/dL — ABNORMAL HIGH (ref 70–99)
Potassium: 4.6 mmol/L (ref 3.5–5.1)
Sodium: 137 mmol/L (ref 135–145)
Total Bilirubin: 7.2 mg/dL — ABNORMAL HIGH (ref 0.0–1.2)
Total Protein: 6.3 g/dL — ABNORMAL LOW (ref 6.5–8.1)

## 2024-02-02 LAB — CBC WITH DIFFERENTIAL/PLATELET
Abs Immature Granulocytes: 0.11 10*3/uL — ABNORMAL HIGH (ref 0.00–0.07)
Basophils Absolute: 0 10*3/uL (ref 0.0–0.1)
Basophils Relative: 0 %
Eosinophils Absolute: 0 10*3/uL (ref 0.0–0.5)
Eosinophils Relative: 0 %
HCT: 24.7 % — ABNORMAL LOW (ref 36.0–46.0)
Hemoglobin: 8.1 g/dL — ABNORMAL LOW (ref 12.0–15.0)
Immature Granulocytes: 1 %
Lymphocytes Relative: 5 %
Lymphs Abs: 0.5 10*3/uL — ABNORMAL LOW (ref 0.7–4.0)
MCH: 36 pg — ABNORMAL HIGH (ref 26.0–34.0)
MCHC: 32.8 g/dL (ref 30.0–36.0)
MCV: 109.8 fL — ABNORMAL HIGH (ref 80.0–100.0)
Monocytes Absolute: 0.6 10*3/uL (ref 0.1–1.0)
Monocytes Relative: 6 %
Neutro Abs: 8.9 10*3/uL — ABNORMAL HIGH (ref 1.7–7.7)
Neutrophils Relative %: 88 %
Platelets: 106 10*3/uL — ABNORMAL LOW (ref 150–400)
RBC: 2.25 MIL/uL — ABNORMAL LOW (ref 3.87–5.11)
RDW: 20.7 % — ABNORMAL HIGH (ref 11.5–15.5)
WBC: 10.1 10*3/uL (ref 4.0–10.5)
nRBC: 0 % (ref 0.0–0.2)

## 2024-02-02 LAB — GLUCOSE, CAPILLARY
Glucose-Capillary: 127 mg/dL — ABNORMAL HIGH (ref 70–99)
Glucose-Capillary: 129 mg/dL — ABNORMAL HIGH (ref 70–99)
Glucose-Capillary: 135 mg/dL — ABNORMAL HIGH (ref 70–99)
Glucose-Capillary: 137 mg/dL — ABNORMAL HIGH (ref 70–99)

## 2024-02-02 LAB — URINE CULTURE: Culture: NO GROWTH

## 2024-02-02 LAB — AMMONIA: Ammonia: 51 umol/L — ABNORMAL HIGH (ref 9–35)

## 2024-02-02 LAB — PROCALCITONIN: Procalcitonin: 2.76 ng/mL

## 2024-02-02 LAB — MAGNESIUM: Magnesium: 2.3 mg/dL (ref 1.7–2.4)

## 2024-02-02 MED ORDER — ONDANSETRON 4 MG PO TBDP: 4.0000 mg | ORAL_TABLET | Freq: Four times a day (QID) | ORAL | Status: DC | PRN

## 2024-02-02 MED ORDER — POLYVINYL ALCOHOL 1.4 % OP SOLN: 1.0000 [drp] | Freq: Four times a day (QID) | OPHTHALMIC | Status: DC | PRN

## 2024-02-02 MED ORDER — MIDAZOLAM HCL 2 MG/2ML IJ SOLN: 1.0000 mg | INTRAMUSCULAR | Status: DC | PRN

## 2024-02-02 MED ORDER — GLYCOPYRROLATE 1 MG PO TABS: 1.0000 mg | ORAL_TABLET | ORAL | Status: DC | PRN

## 2024-02-02 MED ORDER — MORPHINE BOLUS VIA INFUSION
5.0000 mg | INTRAVENOUS | Status: DC | PRN
  Administered 2024-02-02 (×2): 5 mg via INTRAVENOUS

## 2024-02-02 MED ORDER — ONDANSETRON HCL 4 MG/2ML IJ SOLN: 4.0000 mg | Freq: Four times a day (QID) | INTRAMUSCULAR | Status: DC | PRN

## 2024-02-02 MED ORDER — GLYCOPYRROLATE 0.2 MG/ML IJ SOLN: 0.2000 mg | INTRAMUSCULAR | Status: DC | PRN

## 2024-02-02 MED ORDER — MIDAZOLAM BOLUS VIA INFUSION (WITHDRAWAL LIFE SUSTAINING TX)
2.0000 mg | INTRAVENOUS | Status: DC | PRN
  Administered 2024-02-02 (×2): 2 mg via INTRAVENOUS

## 2024-02-02 MED ORDER — FUROSEMIDE 10 MG/ML IJ SOLN
80.0000 mg | Freq: Once | INTRAMUSCULAR | Status: AC
  Administered 2024-02-02: 80 mg via INTRAVENOUS
  Filled 2024-02-02: qty 8

## 2024-02-02 MED ORDER — MIDAZOLAM-SODIUM CHLORIDE 100-0.9 MG/100ML-% IV SOLN
0.0000 mg/h | INTRAVENOUS | Status: DC
  Administered 2024-02-02: 1 mg/h via INTRAVENOUS
  Filled 2024-02-02: qty 100

## 2024-02-02 MED ORDER — MORPHINE 100MG IN NS 100ML (1MG/ML) PREMIX INFUSION
0.0000 mg/h | INTRAVENOUS | Status: DC
  Administered 2024-02-02: 5 mg/h via INTRAVENOUS
  Filled 2024-02-02: qty 100

## 2024-02-02 MED ORDER — GLYCOPYRROLATE 0.2 MG/ML IJ SOLN
0.2000 mg | INTRAMUSCULAR | Status: DC | PRN
  Administered 2024-02-02: 0.2 mg via INTRAVENOUS
  Filled 2024-02-02: qty 1

## 2024-02-04 LAB — LEGIONELLA PNEUMOPHILA SEROGP 1 UR AG: L. pneumophila Serogp 1 Ur Ag: NEGATIVE

## 2024-02-04 LAB — CHOLESTEROL, BODY FLUID: Cholesterol, Fluid: 14 mg/dL

## 2024-02-05 LAB — CYTOLOGY - NON PAP

## 2024-02-05 LAB — BODY FLUID CULTURE W GRAM STAIN: Culture: NO GROWTH

## 2024-02-06 LAB — CULTURE, BLOOD (ROUTINE X 2)
Culture: NO GROWTH
Culture: NO GROWTH
Special Requests: ADEQUATE
Special Requests: ADEQUATE

## 2024-02-24 NOTE — Progress Notes (Addendum)
 Addendum   Daughter, Son in law arrived to bedside. Pt mid 80s on high BiPAP settings, appears to be actively dying.  All in agreement to transition to comfort care  Orders placed  ____________________  Beatris lelon Golas on phone, then again at bedside when he arrived.  Chynah looks worse from when we spoke on the phone, incr WOB without room to incr support on BiPAP. He knows she is in the dying process. He recalls watching his mother's EOL trajectory and says he recognizes this.  He has shared a card w her preferred funeral home information which I have placed in her chart.   Her daughter will be here around 1300. We are going to try to continue current support lines and discuss a transition to comfort measures when she arrives in person. From our phone call earlier today, she understood that this was potential trajectory.     Ronnald Gave MSN, AGACNP-BC [facility] 02/12/24, 12:43 PM  CCT 42 min

## 2024-02-24 NOTE — Progress Notes (Signed)
 SLP Cancellation Note  Patient Details Name: Michelle Michael MRN: [id number] DOB: 19-May-1961   Cancelled treatment:   Pt remains on BiPAP and is not medically stable for swallow eval. Will follow along in chart.  Sundra Haddix L. Vona, MA CCC/SLP Clinical Specialist - Acute Care SLP Acute Rehabilitation Services Office number [phone]         Vona Palma Laurice 19-Feb-2024, 10:35 AM

## 2024-02-24 NOTE — Progress Notes (Signed)
 NAME:  Michelle Michael, MRN:  [id number], DOB:  [birth year], LOS: 8 ADMISSION DATE:  01/25/2024, CONSULTATION DATE:  2/6 REFERRING MD:  Dr. Cheryle, CHIEF COMPLAINT:  shock; respiratory failure   History of Present Illness:  Patient is a 63 year old female with pertinent PMH alcoholic cirrhosis, COPD, T2DM, GERD, HTN, HLD, depression/anxiety presents to [facility] ED on 1/30 with weakness/chills.  Patient having symptoms of weakness and chills.  Also noted some jaundice.  Came to [facility] ED on 1/30 for further workup. BP soft. Found to have new alcoholic liver cirrhosis with ascites.  GI consulted.  Patient had paracentesis on 1/31.  On 3/1 noted to have drop in hemoglobin and was given 1 unit of PRBCs.  Underwent EGD on 2/1 showing portal hypertensive gastropathy and nonbleeding gastric ulcer.  Patient started on lactulose  and rifaximin  for hepatic encephalopathy.  On 2/5 patient with worsening hypotension and hypoxia.  Patient required levo.  On 15L [place]. CXR showing diffuse b/l opacities mostly in RUL; possible aspiration? On 2/6 patient with increasing pressor requirements and worsening hypoxia now on bipap.  Cultures obtained and started on broad-spectrum antibiotics. UA w/ moderate leukocytes. PCCM consulted for transfer to [facility] ICU.   Pertinent  Medical History   Past Medical History:  Diagnosis Date   Anemia    Anxiety    Centrilobular emphysema (HCC) 03/22/2023   Cirrhosis (HCC)    CKD (chronic kidney disease)    COPD (chronic obstructive pulmonary disease) (HCC)    Depression    Diabetes mellitus, type II (HCC)    GERD (gastroesophageal reflux disease)    Hyperlipidemia    Hypertension    Hypokalemia    Hyponatremia    Hypoxemia 01/26/2024   Neuromuscular disorder (HCC) 2021   Substance abuse (HCC)    Years ago   Ulcer 1980s     Significant Hospital Events: Including procedures, antibiotic start and stop dates in addition to other pertinent events   1/30 admitted to [facility] liver  cirrhosis 2/6 transferred to [facility] w/ septic shock on levo and respiratory failure on bipap. Dx para   Interim History / Subjective:  Progressive shock overnight    Objective   Blood pressure (!) 88/49, pulse (!) 108, temperature (!) 97.1 F (36.2 C), temperature source Axillary, resp. rate (!) 25, height 5' 5 (1.651 m), weight 76.1 kg, SpO2 90%. CVP:  [13 mmHg-22 mmHg] 22 mmHg  Vent Mode: BIPAP;PCV FiO2 (%):  [60 %-100 %] 80 % Set Rate:  [12 bmp] 12 bmp PEEP:  [5 cmH20] 5 cmH20   Intake/Output Summary (Last 24 hours) at 2024-02-13 1029 Last data filed at 02/13/24 0800 Gross per 24 hour  Intake 1649.86 ml  Output 950 ml  Net 699.86 ml   Filed Weights   01/31/24 1615 02/01/24 0500 2024-02-13 0500  Weight: 76.7 kg 75.4 kg 76.1 kg    Examination: General:  critically and chronically ill F appears older than stated age  HEENT: BiPAP in place  Neuro: Responds to noxious stim. Protecting airway  CV: s1s2 cap refill is 3 sec  PULM:  Diminished, crackles throughout  GI: round distended non-tender  Extremities: no acute joint deformity  Skin: jaundice    Resolved Hospital Problem list     Assessment & Plan:   DNR status  Acute encephalopathy, multifactorial  Acute resp failure w hypoxia and hypercarbia  Shock: septic 2/2 Multifocal PNA, UTI   Pulm edema  Hx COPD  Shock  AKI, ? HRS  Decompensated alcoholic cirrhosis with  ascites  Alcoholic hepatitis  Elevated LFTs Hyperbilirubinemia  Hyperammonemia  Portal HTN  Severe malnutrition  DM2  AoC Anemia  Thrombocytopenia  P -See IPAL 02/06/24. Have since also d/w her partner Michelle Michael who agrees with DNR/I and plan to re-eval GOC in afternoon when all arrive to bedside. All understand that this is likely EOL, and if we have further decline they would want to transition to comfort  -vanc zosyn   -giving 80 lasix . Spiro was held w her worsening shock but oxygenation may be more limiting than pressures  -has rcvd [facility] albumin . Will  defer add'l 2024-02-06 w oxygenation limitations and renal fxn   -xifaxan . Will hold on converting PO lactulose  to PR. Steroids were deferred w c/f infection -vaso, NE for MAP > 65  -defer adding octreotide for now. Cont PPI   -B Vit/ micronutrient support  -micronutrient support -etoh cessation is imperative  -nebs  -SSI      Best Practice (right click and Reselect all SmartList Selections daily)   Diet/type: NPO w/ oral meds DVT prophylaxis SCD Pressure ulcer(s): N/A GI prophylaxis: PPI Lines: N/A Foley:  Yes, and it is still needed Code Status:  full code Last date of multidisciplinary goals of care discussion 2024/02/06  Labs   CBC: Recent Labs  Lab 01/29/24 1019 01/30/24 0429 01/31/24 0502 02/01/24 0454 02-06-24 0459  WBC 12.3* 13.6* 13.2* 14.5* 10.1  NEUTROABS 10.1* 10.5*  --  12.0* 8.9*  HGB 8.8* 8.9* 9.5* 9.4* 8.1*  HCT 26.9* 27.3* 28.7* 27.9* 24.7*  MCV 106.7* 107.5* 106.7* 106.9* 109.8*  PLT 103* 108* 109* 146* 106*    Basic Metabolic Panel: Recent Labs  Lab 01/26/24 1226 01/27/24 0547 01/27/24 0547 01/28/24 0454 01/29/24 0450 01/30/24 0429 01/31/24 0502 02/01/24 0454 02-06-2024 0459  NA  --  130*   < > 132* 132* 134* 134* 135 137  K  --  3.8   < > 3.6 3.7 3.8 4.2 4.3 4.6  CL  --  96*   < > 97* 98 98 99 100 100  CO2  --  25   < > 23 26 27 26 26 22   GLUCOSE  --  144*   < > 152* 161* 173* 121* 143* 166*  BUN  --  16   < > 19 22 23  29* 38* 44*  CREATININE  --  1.06*   < > 1.26* 1.22* 1.04* 1.18* 1.62* 2.03*  CALCIUM   --  7.3*   < > 7.7* 8.0* 8.5* 8.4* 8.8* 9.0  MG 0.9* 2.0  --   --   --   --   --  2.1 2.3  PHOS  --   --   --  2.4*  --   --   --   --   --    < > = values in this interval not displayed.   GFR: Estimated Creatinine Clearance: 29.3 mL/min (A) (by C-G formula based on SCr of 2.03 mg/dL (H)). Recent Labs  Lab 01/30/24 0429 01/31/24 0502 02/01/24 0454 02/01/24 1300 2024/02/06 0459  PROCALCITON  --   --   --  2.18 2.76  WBC 13.6* 13.2* 14.5*   --  10.1    Liver Function Tests: Recent Labs  Lab 01/29/24 0450 01/30/24 0429 01/31/24 0502 02/01/24 0454 02/01/24 1712 Feb 06, 2024 0459  AST 40 52* 64* 76*  --  74*  ALT 22 24 25 28   --  23  ALKPHOS 138* 148* 142* 136*  --  92  BILITOT  4.9* 4.6* 4.8* 5.4*  --  7.2*  PROT 5.1* 5.3* 5.3* 5.4* 5.2* 6.3*  ALBUMIN  1.8* 1.9* 1.9* 1.9*  --  3.6   No results for input(s): LIPASE, AMYLASE in the last 168 hours.  Recent Labs  Lab 01/28/24 1109 01/30/24 0921 01/31/24 1631 2024-02-17 0826  AMMONIA 52* 46* 33 51*    ABG    Component Value Date/Time   PHART 7.37 02/01/2024 1012   PCO2ART 49 (H) 02/01/2024 1012   PO2ART 82 (L) 02/01/2024 1012   HCO3 28.3 (H) 02/01/2024 1012   O2SAT 99 02/01/2024 1438     Coagulation Profile: Recent Labs  Lab 01/27/24 0547 01/28/24 0454 01/29/24 1019 01/30/24 0429 01/31/24 1233  INR 1.8* 1.7* 1.8* 1.8* 1.9*    Cardiac Enzymes: No results for input(s): CKTOTAL, CKMB, CKMBINDEX, TROPONINI in the last 168 hours.  HbA1C: Hemoglobin A1C  Date/Time Value Ref Range Status  10/27/2023 12:00 AM 4.9  Final  09/28/2023 10:52 AM 501.0 (A) 4.0 - 5.6 % Final  11/25/2022 10:11 AM 5.7 (A) 4.0 - 5.6 % Final  06/24/2021 12:00 AM 6.1  Final   HbA1c, POC (controlled diabetic range)  Date/Time Value Ref Range Status  07/25/2022 10:23 AM 5.9 0.0 - 7.0 % Final  03/23/2022 11:13 AM 5.6 0.0 - 7.0 % Final    CBG: Recent Labs  Lab 02/01/24 1608 02/01/24 2121 02-17-2024 0003 Feb 17, 2024 0409 Feb 17, 2024 0735  GLUCAP 147* 148* 129* 127* 135*   CRITICAL CARE Performed by: Ronnald FORBES Gave   Total critical care time: 60 minutes  Critical care time was exclusive of separately billable procedures and treating other patients.  Critical care was necessary to treat or prevent imminent or life-threatening deterioration.  Critical care was time spent personally by me on the following activities: development of treatment plan with patient and/or  surrogate as well as nursing, discussions with consultants, evaluation of patient's response to treatment, examination of patient, obtaining history from patient or surrogate, ordering and performing treatments and interventions, ordering and review of laboratory studies, ordering and review of radiographic studies, pulse oximetry and re-evaluation of patient's condition.  Ronnald Gave MSN, AGACNP-BC [place] [facility] Amion for pager  17-Feb-2024, 10:29 AM

## 2024-02-24 NOTE — Death Summary Note (Addendum)
 DEATH SUMMARY   Patient Details  Name: Michelle Michael MRN: [id number] DOB: [birth year]  Admission/Discharge Information   Admit Date:  02/22/2024  Date of Death: Date of Death: March 01, 2024  Time of Death: Time of Death: 1444  Length of Stay: 8  Referring Physician: Melvenia Manus BRAVO, MD   Reason(s) for Hospitalization  Jaundice -- decompensated alcoholic cirrhosis   Diagnoses  Preliminary cause of death: decompensated cirrhosis Secondary Diagnoses (including complications and co-morbidities):  Principal Problem:   Generalized weakness Active Problems:   Symptomatic anemia   Leukocytosis   Abnormal CT of the abdomen   Anxiety and depression   Type 2 diabetes mellitus with peripheral neuropathy (HCC)   Dyslipidemia   Chronic obstructive pulmonary disease (COPD) (HCC)   GERD (gastroesophageal reflux disease)   Hypoxemia   ABLA (acute blood loss anemia)   Alcoholic hepatitis with ascites   Encephalopathy   Gastrointestinal hemorrhage associated with gastric ulcer   Jaundice   DNR (do not resuscitate)   Acute respiratory failure with hypoxia (HCC)   Encounter for palliative care   Goals of care, counseling/discussion   Encephalopathy acute   Decompensated hepatic cirrhosis (HCC) Ascites  Septic shock  AKI  Gastric ulcer  Coagulopathy   Brief Hospital Course (including significant findings, care, treatment, and services provided and events leading to death)  Michelle Michael is a 63 y.o. year old female who presented to [facility] 02/22/2024 at referral of her outpatient doctor for further evaluation of her Jaundice. She was admitted to [facility] for management of decompensated cirrhosis, melena related to a suspect gastric ulcer, concern for infection, among other metabolic abnormalities. GI was consulted 1/31 at which time her MDF was 34.2 and MELD 3.0 26. Had a para with IR 1/31, which was not c/w SBP. She underwent EGD 2/1 which revealed mild Schatzki's ring, portal hypertensive  gastropathy and a cratered gastric ulcer. From 2/2 - 2/4 pt had worsening mentation. She was maintained on abx and supportive care measures like PPI, rifaximin , lactulose , lasix , spiro and as needed PRBC.  On 2/5 she had downtrending Bps and required increasing midodrine  doses, and ultimately pressors. Her respiratory status and renal function also declined. She was transferred to [facility] on 2/6, requiring escalating doses of NE, addition of vaso and albumin , as well as BiPAP in addition to ongoing measures. She had a repeat para to r/o SBP again. Her mentation was poor.   On 03-01-24 pt had increasing pressor and BiPAP requirements, with worsening mentation. Discussions between care team and family occurred and it was shared that pt wanted to be DNR. As the patient continued to decline over the day, a decision was reached to transition to comfort care.   The patient died peacefully Mar 01, 2024 at 14:44    Pertinent Labs and Studies  Significant Diagnostic Studies US  EKG SITE RITE Result Date: 02/01/2024 If Site Rite image not attached, placement could not be confirmed due to current cardiac rhythm.  DG CHEST PORT 1 VIEW Result Date: 01/31/2024 CLINICAL DATA:  Dyspnea EXAM: PORTABLE CHEST 1 VIEW COMPARISON:  Chest x-ray Feb 22, 2024 FINDINGS: Diffuse bilateral airspace opacities are seen, predominantly centrally. This is most significant in the right upper lobe. There is no pleural effusion or pneumothorax. The cardiomediastinal silhouette is within normal limits. No acute fractures are seen. IMPRESSION: Diffuse bilateral airspace opacities, predominantly centrally. This is most significant in the right upper lobe. Findings may be related to multifocal pneumonia, pulmonary edema and/or hemorrhage. Electronically Signed   By: Amy  Maple M.D.   On: 01/31/2024 18:06   US  EKG SITE RITE Result Date: 01/31/2024 If Site Rite image not attached, placement could not be confirmed due to current cardiac rhythm.  US   Paracentesis Result Date: 01/26/2024 INDICATION: Patient with history of ETOH cirrhosis admitted with jaundice, found to have new onset ascites. Request for diagnostic and therapeutic paracentesis. EXAM: ULTRASOUND GUIDED DIAGNOSTIC AND THERAPEUTIC PARACENTESIS MEDICATIONS: 8 mL 1% lidocaine  COMPLICATIONS: None immediate. PROCEDURE: Informed written consent was obtained from the patient after a discussion of the risks, benefits and alternatives to treatment. A timeout was performed prior to the initiation of the procedure. Initial ultrasound scanning demonstrates a moderate amount of ascites within the right upper abdominal quadrant. The right upper abdomen was prepped and draped in the usual sterile fashion. 1% lidocaine  was used for local anesthesia. Following this, a 19 gauge, 7-cm, Yueh catheter was introduced. An ultrasound image was saved for documentation purposes. The paracentesis was performed. The catheter was removed and a dressing was applied. The patient tolerated the procedure well without immediate post procedural complication. FINDINGS: A total of approximately 2.5 L of clear yellow fluid was removed. Samples were sent to the laboratory as requested by the clinical team. IMPRESSION: Successful ultrasound-guided paracentesis yielding 2.5 liters of peritoneal fluid. Performed by Clotilda Hesselbach, PA-C Electronically Signed   By: Marcey Moan M.D.   On: 01/26/2024 16:00   DG Chest Port 1 View Result Date: 01/25/2024 CLINICAL DATA:  Hypoxia. EXAM: PORTABLE CHEST 1 VIEW COMPARISON:  06/17/2015, lung bases from abdominal CT earlier today FINDINGS: The cardiomediastinal contours are normal. The lungs are clear. Pulmonary vasculature is normal. No consolidation, pleural effusion, or pneumothorax. No acute osseous abnormalities are seen. IMPRESSION: No acute chest findings. Electronically Signed   By: Andrea Gasman M.D.   On: 01/25/2024 23:34   CT ABDOMEN PELVIS W CONTRAST Result Date:  01/25/2024 CLINICAL DATA:  Abdominal pain, acute, nonlocalized EXAM: CT ABDOMEN AND PELVIS WITH CONTRAST TECHNIQUE: Multidetector CT imaging of the abdomen and pelvis was performed using the standard protocol following bolus administration of intravenous contrast. RADIATION DOSE REDUCTION: This exam was performed according to the departmental dose-optimization program which includes automated exposure control, adjustment of the mA and/or kV according to patient size and/or use of iterative reconstruction technique. CONTRAST:  OMNIPAQUE  IOHEXOL  300 MG/ML  SOLN COMPARISON:  Ultrasound abdomen 12/15/2023 FINDINGS: Lower chest: No acute abnormality. Hepatobiliary: Nodular hepatic contour. No focal liver abnormality. Status post cholecystectomy. No biliary dilatation. Pancreas: No focal lesion. Normal pancreatic contour. No surrounding inflammatory changes. No main pancreatic ductal dilatation. Spleen: Normal in size without focal abnormality. Adrenals/Urinary Tract: No adrenal nodule bilaterally. Bilateral kidneys enhance symmetrically. Fluid dense lesion likely represents a simple renal cyst. Simple renal cysts, in the absence of clinically indicated signs/symptoms, require no independent follow-up. No hydronephrosis. No hydroureter. The urinary bladder is unremarkable. Stomach/Bowel: Question thinning and slight hazy contour of the greater curvature (2:26, 4:22). No evidence of bowel wall thickening or dilatation. Appendix appears normal. Vascular/Lymphatic: The portal, splenic, superior mesenteric veins are patent. No abdominal aorta or iliac aneurysm. Severe atherosclerotic plaque of the aorta and its branches. No abdominal, pelvic, or inguinal lymphadenopathy. Reproductive: Uterus and bilateral adnexa are unremarkable. Other: Moderate volume simple free fluid ascites. No intraperitoneal free gas. No organized fluid collection. Musculoskeletal: Diffuse subsegmental atelectasis. No suspicious lytic or blastic  osseous lesions. No acute displaced fracture. IMPRESSION: 1. Question thinning and slight hazy contour of the greater curvature. Underlying ulceration is not excluded.  No bowel perforation. Consider correlation with direct visualization. 2. Cirrhosis. No focal liver lesions identified. Please note that liver protocol enhanced MR and CT are the most sensitive tests for the screening detection of hepatocellular carcinoma in the high risk setting of cirrhosis. 3. Moderate volume simple free fluid ascites. 4.  Aortic Atherosclerosis (ICD10-I70.0). Electronically Signed   By: Morgane  Naveau M.D.   On: 01/25/2024 22:24    Microbiology Recent Results (from the past 240 hours)  Respiratory (~20 pathogens) panel by PCR     Status: None   Collection Time: 01/26/24 11:12 AM   Specimen: Nasopharyngeal Swab; Respiratory  Result Value Ref Range Status   Adenovirus NOT DETECTED NOT DETECTED Final   Coronavirus 229E NOT DETECTED NOT DETECTED Final    Comment: (NOTE) The Coronavirus on the Respiratory Panel, DOES NOT test for the novel  Coronavirus (2019 nCoV)    Coronavirus HKU1 NOT DETECTED NOT DETECTED Final   Coronavirus NL63 NOT DETECTED NOT DETECTED Final   Coronavirus OC43 NOT DETECTED NOT DETECTED Final   Metapneumovirus NOT DETECTED NOT DETECTED Final   Rhinovirus / Enterovirus NOT DETECTED NOT DETECTED Final   Influenza A NOT DETECTED NOT DETECTED Final   Influenza B NOT DETECTED NOT DETECTED Final   Parainfluenza Virus 1 NOT DETECTED NOT DETECTED Final   Parainfluenza Virus 2 NOT DETECTED NOT DETECTED Final   Parainfluenza Virus 3 NOT DETECTED NOT DETECTED Final   Parainfluenza Virus 4 NOT DETECTED NOT DETECTED Final   Respiratory Syncytial Virus NOT DETECTED NOT DETECTED Final   Bordetella pertussis NOT DETECTED NOT DETECTED Final   Bordetella Parapertussis NOT DETECTED NOT DETECTED Final   Chlamydophila pneumoniae NOT DETECTED NOT DETECTED Final   Mycoplasma pneumoniae NOT DETECTED NOT  DETECTED Final    Comment: Performed at [facility], [address], [place], [place] [address]  Culture, blood (Routine X 2) w Reflex to ID Panel     Status: None   Collection Time: 01/26/24 12:26 PM   Specimen: BLOOD  Result Value Ref Range Status   Specimen Description BLOOD RIGHT ANTECUBITAL  Final   Special Requests   Final    Blood Culture adequate volume BOTTLES DRAWN AEROBIC AND ANAEROBIC   Culture   Final    NO GROWTH 5 DAYS Performed at [facility], [address], [place], [place] [address]    Report Status 01/31/2024 FINAL  Final  Culture, blood (Routine X 2) w Reflex to ID Panel     Status: None   Collection Time: 01/26/24 12:26 PM   Specimen: BLOOD  Result Value Ref Range Status   Specimen Description BLOOD LEFT ANTECUBITAL  Final   Special Requests   Final    BOTTLES DRAWN AEROBIC AND ANAEROBIC Blood Culture adequate volume   Culture   Final    NO GROWTH 5 DAYS Performed at [facility], [address] [place], [place] [address]    Report Status 01/31/2024 FINAL  Final  Acid Fast Smear (AFB)     Status: None   Collection Time: 01/26/24  3:18 PM  Result Value Ref Range Status   AFB Specimen Processing Concentration  Final   Acid Fast Smear Negative  Final    Comment: (NOTE) Performed At: [facility] [address] [place], [place] [id number] Jennette Shorter MD Ey:[id number]    Source (AFB) ASCITIC  Final    Comment: Performed at [facility], [address], [place], [place] [address]  Gram stain     Status:  None   Collection Time: 01/26/24  3:18 PM   Specimen: Ascitic  Result Value Ref Range Status   Specimen Description ASCITIC  Final   Special Requests NONE  Final   Gram Stain   Final    PERITONEAL CYTOSPIN SMEAR NO ORGANISMS SEEN WBC PRESENT,BOTH PMN AND MONONUCLEAR Performed at [facility], [address], [place], [place] [address]    Report Status 01/26/2024 FINAL  Final  Culture, body fluid w Gram Stain-bottle     Status: None    Collection Time: 01/26/24  3:18 PM   Specimen: Ascitic  Result Value Ref Range Status   Specimen Description ASCITIC  Final   Special Requests BOTTLES DRAWN AEROBIC AND ANAEROBIC 10CC  Final   Culture   Final    NO GROWTH 5 DAYS Performed at [facility], [address], [place], [place] [address]    Report Status 01/31/2024 FINAL  Final  MRSA Next Gen by PCR, Nasal     Status: None   Collection Time: 01/31/24  4:00 PM   Specimen: Nasal Mucosa; Nasal Swab  Result Value Ref Range Status   MRSA by PCR Next Gen NOT DETECTED NOT DETECTED Final    Comment: (NOTE) The GeneXpert MRSA Assay (FDA approved for NASAL specimens only), is one component of a comprehensive MRSA colonization surveillance program. It is not intended to diagnose MRSA infection nor to guide or monitor treatment for MRSA infections. Test performance is not FDA approved in patients less than 39 years old. Performed at [facility], [address], [place], [place] [address]   Culture, blood (Routine X 2) w Reflex to ID Panel     Status: None (Preliminary result)   Collection Time: 02/01/24  8:22 AM   Specimen: BLOOD  Result Value Ref Range Status   Specimen Description BLOOD BLOOD LEFT HAND  Final   Special Requests   Final    BOTTLES DRAWN AEROBIC AND ANAEROBIC Blood Culture adequate volume   Culture   Final    NO GROWTH < 24 HOURS Performed at [facility], [address] [place], [place] [address]    Report Status PENDING  Incomplete  Culture, blood (Routine X 2) w Reflex to ID Panel     Status: None (Preliminary result)   Collection Time: 02/01/24  8:22 AM   Specimen: BLOOD  Result Value Ref Range Status   Specimen Description BLOOD BLOOD LEFT ARM wrist  Final   Special Requests   Final    BOTTLES DRAWN AEROBIC AND ANAEROBIC Blood Culture adequate volume   Culture   Final    NO GROWTH < 24 HOURS Performed at [facility], [address], [place], [place] [address]    Report Status PENDING  Incomplete   Urine Culture (for pregnant, neutropenic or urologic patients or patients with an indwelling urinary catheter)     Status: None   Collection Time: 02/01/24  9:10 AM   Specimen: Urine, Clean Catch  Result Value Ref Range Status   Specimen Description   Final    URINE, CLEAN CATCH Performed at [facility], [address] [place], [place] [address]    Special Requests   Final    NONE Performed at [facility], [address] [place], [place] [address]    Culture   Final    NO GROWTH Performed at [facility], [address] [place], [place] [address]    Report Status 2024/02/06 FINAL  Final  Respiratory (~20 pathogens) panel by  PCR     Status: None   Collection Time: 02/01/24 11:26 AM   Specimen: Nasopharyngeal Swab; Respiratory  Result Value Ref Range Status   Adenovirus NOT DETECTED NOT DETECTED Final   Coronavirus 229E NOT DETECTED NOT DETECTED Final    Comment: (NOTE) The Coronavirus on the Respiratory Panel, DOES NOT test for the novel  Coronavirus (2019 nCoV)    Coronavirus HKU1 NOT DETECTED NOT DETECTED Final   Coronavirus NL63 NOT DETECTED NOT DETECTED Final   Coronavirus OC43 NOT DETECTED NOT DETECTED Final   Metapneumovirus NOT DETECTED NOT DETECTED Final   Rhinovirus / Enterovirus NOT DETECTED NOT DETECTED Final   Influenza A NOT DETECTED NOT DETECTED Final   Influenza B NOT DETECTED NOT DETECTED Final   Parainfluenza Virus 1 NOT DETECTED NOT DETECTED Final   Parainfluenza Virus 2 NOT DETECTED NOT DETECTED Final   Parainfluenza Virus 3 NOT DETECTED NOT DETECTED Final   Parainfluenza Virus 4 NOT DETECTED NOT DETECTED Final   Respiratory Syncytial Virus NOT DETECTED NOT DETECTED Final   Bordetella pertussis NOT DETECTED NOT DETECTED Final   Bordetella Parapertussis NOT DETECTED NOT DETECTED Final   Chlamydophila pneumoniae NOT DETECTED NOT DETECTED Final   Mycoplasma pneumoniae NOT DETECTED NOT DETECTED Final    Comment: Performed at [facility],  [address] [place], [place] [address]  SARS Coronavirus 2 by RT PCR (hospital order, performed in [facility] hospital lab) *cepheid single result test* Anterior Nasal Swab     Status: None   Collection Time: 02/01/24 12:28 PM   Specimen: Anterior Nasal Swab  Result Value Ref Range Status   SARS Coronavirus 2 by RT PCR NEGATIVE NEGATIVE Final    Comment: Performed at [facility], [address] [place], [place] [address]  Body fluid culture w Gram Stain     Status: None (Preliminary result)   Collection Time: 02/01/24  5:11 PM   Specimen: Peritoneal Fluid  Result Value Ref Range Status   Specimen Description FLUID PERITONEAL  Final   Special Requests NONE  Final   Gram Stain   Final    WBC PRESENT, PREDOMINANTLY MONONUCLEAR NO ORGANISMS SEEN CYTOSPIN SMEAR    Culture   Final    NO GROWTH < 12 HOURS Performed at [facility], [address], [place], [place] [address]    Report Status PENDING  Incomplete    Lab Basic Metabolic Panel: Recent Labs  Lab 01/27/24 0547 01/28/24 0454 01/29/24 0450 01/30/24 0429 01/31/24 0502 02/01/24 0454 02/07/24 0459  NA 130* 132* 132* 134* 134* 135 137  K 3.8 3.6 3.7 3.8 4.2 4.3 4.6  CL 96* 97* 98 98 99 100 100  CO2 25 23 26 27 26 26 22   GLUCOSE 144* 152* 161* 173* 121* 143* 166*  BUN 16 19 22 23  29* 38* 44*  CREATININE 1.06* 1.26* 1.22* 1.04* 1.18* 1.62* 2.03*  CALCIUM  7.3* 7.7* 8.0* 8.5* 8.4* 8.8* 9.0  MG 2.0  --   --   --   --  2.1 2.3  PHOS  --  2.4*  --   --   --   --   --    Liver Function Tests: Recent Labs  Lab 01/29/24 0450 01/30/24 0429 01/31/24 0502 02/01/24 0454 02/01/24 1712 02/07/24 0459  AST 40 52* 64* 76*  --  74*  ALT 22 24 25 28   --  23  ALKPHOS 138* 148* 142* 136*  --  92  BILITOT 4.9*  4.6* 4.8* 5.4*  --  7.2*  PROT 5.1* 5.3* 5.3* 5.4* 5.2* 6.3*  ALBUMIN  1.8* 1.9* 1.9* 1.9*  --  3.6   No results for input(s): LIPASE, AMYLASE in the last 168 hours. Recent Labs  Lab 01/28/24 1109  01/30/24 0921 01/31/24 1631 02-25-24 0826  AMMONIA 52* 46* 33 51*   CBC: Recent Labs  Lab 01/29/24 1019 01/30/24 0429 01/31/24 0502 02/01/24 0454 February 25, 2024 0459  WBC 12.3* 13.6* 13.2* 14.5* 10.1  NEUTROABS 10.1* 10.5*  --  12.0* 8.9*  HGB 8.8* 8.9* 9.5* 9.4* 8.1*  HCT 26.9* 27.3* 28.7* 27.9* 24.7*  MCV 106.7* 107.5* 106.7* 106.9* 109.8*  PLT 103* 108* 109* 146* 106*   Cardiac Enzymes: No results for input(s): CKTOTAL, CKMB, CKMBINDEX, TROPONINI in the last 168 hours. Sepsis Labs: Recent Labs  Lab 01/30/24 0429 01/31/24 0502 02/01/24 0454 02/01/24 1300 Feb 25, 2024 0459  PROCALCITON  --   --   --  2.18 2.76  WBC 13.6* 13.2* 14.5*  --  10.1    Procedures/Operations  Para 1/31 PICC 2/5 Para 2/6    Ronnald FORBES Gave 02-25-2024, 3:50 PM

## 2024-02-24 NOTE — IPAL (Signed)
  Interdisciplinary Goals of Care Family Meeting   Date carried out: 02-20-24  Location of the meeting: Phone conference  Member's involved: Nurse Practitioner, Bedside Registered Nurse, and Family Member or next of kin  Durable Power of Attorney or acting medical decision maker: Daughter [name]    Discussion: We discussed goals of care for Michelle Michael .  I spoke with daughter lauraine [facility]) regarding her mom's clinical status. We talked about her dx--  as well as her clinical decline with incr BiPAP and pressor requirements.   [name] has spoken with Raenell's friend ([employer] contact Nancyann) who had shared with [name] that Leilanni had previously endorsed wanting to be DNR/I. [name] independently also feels that based on the clinical information DNR/I is appropriate.   We will change our code status to DNR/I  Cont pressors and BiPAP. Cont abx etc   If we lose airway protection, have worse gas exchange that is not improvable with BiPAP, or progressively worsening shock plan is to discuss transitioning to comfort care. [name] and I talked about what this would look like and all questions were answered. Her hope is to be at the hospital early this afternoon and we can assess how her mom is doing then.    Code status:   Code Status: Limited: Do not attempt resuscitation (DNR) -DNR-LIMITED -Do Not Intubate/DNI    Disposition: Continue current acute care  Time spent for the meeting: 16 min    Ronnald FORBES Gave, NP  02/20/24, 9:15 AM

## 2024-02-24 NOTE — Progress Notes (Signed)
 PT Cancellation Note  Patient Details Name: Michelle Michael MRN: [id number] DOB: Sep 07, 1961   Cancelled Treatment:    Reason Eval/Treat Not Completed: Other (comment) (Pt on BIPAP. Difficulty with respirations. Will discharge at this time. Please re-consult if further needs arise.)  Dorothyann Maier, DPT, CLT  Acute Rehabilitation Services Office: [phone] (Secure chat preferred)   Dorothyann VEAR Maier February 19, 2024, 2:15 PM

## 2024-02-24 NOTE — Consult Note (Signed)
 Consultation Note   Referring Provider:  PCCM PCP: Melvenia Manus BRAVO, MD Primary Gastroenterologist: Carlin Hasty, DO        Reason for Consultation: Decompensated cirrhosis  DOA: 01/25/2024         Hospital Day: 9   ASSESSMENT    Brief Narrative:  63 y.o. year old female with a history of GERD , alcoholic cirrhosis, duodenal ulcers , intestinal metaplasia (gastric) colon polyps , DM2, hypertension, CAD. Transferred from [facility] with respiratory failure , decompensated cirrhosis.   Decompensated Etoh cirrhosis with ascites, hepatic encephalopathy, worsening coagulopathy, jaundice and probably HRS.  Possible has Etoh hepatitis but wasn't candidate for steroids while at [facility]. t No SBP on samples from 1/31 or 02/01/24. Renal function progressively getting worse despite IV albumin , vasopressors and antibiotics. She has diminished urinary output despite lasix . Cannot assess for encephalopathy ( on precedex ) but per notes / staff she was becoming progressively encephalopathic while is probably multifactorial and not just secondary to liver decompensation. Has been getting lactulose  / Xifaxan  ( until today)   Acute respiratory failure with hypoxia / multifocal PNA ( possibly aspiration) , pulmonary edema and underlying COPD On BiPap. She is a DNI.   Acute on chronic macrocytic anemia.  Hgb 8.1 (baseline ~ 11.6) No overt GI bleeding  Principal Problem:   Generalized weakness Active Problems:   Symptomatic anemia   Leukocytosis   Abnormal CT of the abdomen   Anxiety and depression   Type 2 diabetes mellitus with peripheral neuropathy (HCC)   Dyslipidemia   Chronic obstructive pulmonary disease (COPD) (HCC)   GERD (gastroesophageal reflux disease)   Hypoxemia   ABLA (acute blood loss anemia)   Alcoholic hepatitis with ascites   Encephalopathy   Gastrointestinal hemorrhage associated with gastric ulcer   Jaundice      PLAN:    --Getting IV thiamine  --Lactulose  / Xifaxan  can be given via NGT if needed. Lactulose  enemas an option as well but more immediate issue is that of repiratory failure and probable HRS.  --On zosyn , vancomycin , vasopressors and BiPaP. PCCM has discussed with daughter and patient's code has been changed to limited DNR - Do not intubate. Continuing pressors, antibiotics and BiPap. Worsening renal function after at least 24 hours of IV albumin  and vasopressors , not sure IV albumin  helpful now and may be harmful if has she pulmonary edema.  --GI will follow   HPI    Patient was admitted to [facility] 01/26/24 with weakness and jaundice.  Hemoglobin noted to be lower than her usual.  Her white count was elevated.  CT scan right skin for concern for gastric ulcer as well as expected findings of cirrhosis and moderate ascites.  Her MELD at the time was 26, Madrey's discriminant function 34.2.  Steroids were not initiated due to concern for infection.  She was started on IV antibiotics, IV PPI.  Inpatient EGD by Dr. Hasty on 01/27/2024 did show a nonbleeding gastric ulcer with a clean base as well as portal hypertensive gastropathy.  During that admission she became progressively encephalopathic. Started on Lactulose  and Xifaxan  She was on a CIWA protocol.  She did have a paracentesis with removal of 2.5 L which was negative for SBP. Her  developed worsening hypotension and hypoxia . Required vasopressors and 15 L of 02. She was transferred to [facility] ICU.   Unable to get history from patient.     Previous GI Evaluations   01/27/24 EGD ---Small hiatal hernia. Mild Schatzki ring. Portal hypertensive gastropathy. Non-bleeding gastric ulcer with a clean ulcer base (Forrest Class III). Normal duodenal bulb, first portion of the duodenum and second portion of the duodenum. - No specimens collected  EGD 02/10/23: -No evidence of varices -grade B reflux esophagitis -non severe candidiasis esophagitis, KOH  prep negative. -gastritis s/p biopsy (reactive gastropathy and findings consistent with portal hypertensive gastropathy), negative H. pylori. -normal duodenum  EGD in February 2021:  Nonbleeding cratered duodenal ulcer without stigmata of bleeding and second portion of duodenum, gastritis with moderate chronic inactive gastritis and intestinal metaplasia, negative H. 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   Colonoscopy February 2021:  2 polyps in the sigmoid colon, tubular adenomas    Labs and Imaging: Recent Labs    01/31/24 0502 02/01/24 0454 02-25-24 0459  WBC 13.2* 14.5* 10.1  HGB 9.5* 9.4* 8.1*  HCT 28.7* 27.9* 24.7*  PLT 109* 146* 106*   Recent Labs    01/31/24 0502 02/01/24 0454 02-25-2024 0459  NA 134* 135 137  K 4.2 4.3 4.6  CL 99 100 100  CO2 26 26 22   GLUCOSE 121* 143* 166*  BUN 29* 38* 44*  CREATININE 1.18* 1.62* 2.03*  CALCIUM  8.4* 8.8* 9.0   Recent Labs    2024/02/25 0459  PROT 6.3*  ALBUMIN  3.6  AST 74*  ALT 23  ALKPHOS 92  BILITOT 7.2*   No results for input(s): HEPBSAG, HCVAB, HEPAIGM, HEPBIGM in the last 72 hours. Recent Labs    01/31/24 1233  LABPROT 22.2*  INR 1.9*      Past Medical History:  Diagnosis Date   Anemia    Anxiety    Centrilobular emphysema (HCC) 03/22/2023   Cirrhosis (HCC)    CKD (chronic kidney disease)    COPD (chronic obstructive pulmonary disease) (HCC)    Depression    Diabetes mellitus, type II (HCC)    GERD (gastroesophageal reflux disease)    Hyperlipidemia    Hypertension    Hypokalemia    Hyponatremia    Hypoxemia 01/26/2024   Neuromuscular disorder (HCC) 2021   Substance abuse (HCC)    Years ago   Ulcer 1980s    Past Surgical History:  Procedure Laterality Date   BIOPSY  02/10/2023   Procedure: BIOPSY;  Surgeon: Cindie Carlin POUR, DO;  Location: [facility];  Service: Endoscopy;;   CESAREAN SECTION  1984   CHOLECYSTECTOMY     ESOPHAGEAL  BRUSHING  02/10/2023   Procedure: ESOPHAGEAL BRUSHING;  Surgeon: Cindie Carlin POUR, DO;  Location: [facility];  Service: Endoscopy;;   ESOPHAGOGASTRODUODENOSCOPY (EGD) WITH PROPOFOL  N/A 02/10/2023   Procedure: ESOPHAGOGASTRODUODENOSCOPY (EGD) WITH PROPOFOL ;  Surgeon: Cindie Carlin POUR, DO;  Location: [facility];  Service: Endoscopy;  Laterality: N/A;  8:45 am   ESOPHAGOGASTRODUODENOSCOPY (EGD) WITH PROPOFOL  N/A 01/27/2024   Procedure: ESOPHAGOGASTRODUODENOSCOPY (EGD) WITH PROPOFOL ;  Surgeon: Cindie Carlin POUR, DO;  Location: [facility];  Service: Endoscopy;  Laterality: N/A;   FRACTURE SURGERY  2005   ORTHOPEDIC SURGERY     SPINE SURGERY  2003   TUBAL LIGATION  1993    Family History  Problem Relation Age of Onset   Diabetes Mother    Hypertension Mother  Alcohol  abuse Mother    Cancer Mother    Hypertension Father    Arthritis Father    Heart disease Father    Cancer Daughter    Obesity Daughter    Drug abuse Son    Breast cancer Neg Hx     Prior to Admission medications   Medication Sig Start Date End Date Taking? Authorizing Provider  busPIRone  (BUSPAR ) 10 MG tablet Take 10 mg by mouth 3 (three) times daily. 05/31/21  Yes [provider]  DULoxetine  (CYMBALTA ) 60 MG capsule Take 60 mg by mouth daily. 12/08/23  Yes [provider]  fluticasone  (FLONASE ) 50 MCG/ACT nasal spray USE 2 SPRAYS IN EACH NOSTRIL EVERY DAY 01/22/24  Yes Melvenia Manus BRAVO, MD  furosemide  (LASIX ) 40 MG tablet Take 1 tablet (40 mg total) by mouth 2 (two) times daily. 12/13/23 03/12/24 Yes Mahon, Charmaine CROME, NP  gabapentin  (NEURONTIN ) 300 MG capsule Take 1 capsule (300 mg total) by mouth once for 1 dose. Patient taking differently: Take 300 mg by mouth once. 12/21/23 01/26/24 Yes Melvenia Manus BRAVO, MD  gabapentin  (NEURONTIN ) 600 MG tablet Take 1 tablet (600 mg total) by mouth 3 (three) times daily. 12/21/23  Yes Melvenia Manus BRAVO, MD  metFORMIN  (GLUCOPHAGE -XR) 500 MG 24 hr tablet Take 1  tablet (500 mg total) by mouth daily with breakfast. 09/28/23  Yes Therisa Benton PARAS, NP  ondansetron  (ZOFRAN ) 4 MG tablet Take 1 tablet (4 mg total) by mouth every 8 (eight) hours as needed. 12/21/23  Yes Melvenia Manus BRAVO, MD  pantoprazole  (PROTONIX ) 40 MG tablet Take 1 tablet (40 mg total) by mouth 2 (two) times daily. 12/13/23 12/12/24 Yes Mahon, Charmaine CROME, NP  Potassium Chloride  40 MEQ/15ML (20%) SOLN Take 7.5 mLs by mouth daily. 12/21/23  Yes Melvenia Manus BRAVO, MD  REPATHA  140 MG/ML SOSY INJECT 140MG  UNDER THE SKIN EVERY 14 DAYS 01/17/24  Yes Melvenia Manus BRAVO, MD  spironolactone  (ALDACTONE ) 100 MG tablet Take 1 tablet (100 mg total) by mouth daily. 12/13/23  Yes Kennedy Charmaine CROME, NP  STIOLTO RESPIMAT  2.5-2.5 MCG/ACT AERS INHALE 2 PUFFS BY MOUTH ONCE DAILY 01/18/24  Yes Melvenia Manus BRAVO, MD  tirzepatide  (MOUNJARO ) 2.5 MG/0.5ML Pen INJECT 2.5MG  (1 PEN) UNDER THE SKIN EVERY WEEK 01/10/24  Yes Melvenia Manus BRAVO, MD  traZODone  (DESYREL ) 100 MG tablet Take 100 mg by mouth at bedtime. 12/08/23  Yes [provider]  VENTOLIN  HFA 108 (90 Base) MCG/ACT inhaler INHALE 2 PUFFS BY MOUTH EVERY 6 HOURS AS NEEDED FOR WHEEZING OR SHORTNESS OF BREATH 12/21/23  Yes Melvenia Manus BRAVO, MD  Continuous Glucose Receiver (FREESTYLE LIBRE 3 READER) DEVI 1 each by Other route See admin instructions. 12/21/23   Therisa Benton PARAS, NP  Continuous Glucose Sensor (FREESTYLE LIBRE 3 PLUS SENSOR) MISC Change sensor every 15 days. 09/28/23   Therisa Benton PARAS, NP  folic acid  (FOLVITE ) 400 MCG tablet Take 400 mcg by mouth daily. Patient not taking: Reported on 01/26/2024    [provider]  Glucagon, rDNA, (GLUCAGON EMERGENCY) 1 MG KIT Inject 1 mg into the muscle as needed (low blood sugar). 08/02/21   [provider]  insulin  isophane & regular human KwikPen (NOVOLIN  70/30 KWIKPEN) (70-30) 100 UNIT/ML KwikPen Inject 15 Units into the skin in the morning and at bedtime. 09/28/23   Therisa Benton PARAS, NP   Insulin  Pen Needle (BD PEN NEEDLE NANO 2ND GEN) 32G X 4 MM MISC Use to inject insulin  and Victoza  for 3 injections per day  09/13/23   Therisa Benton PARAS, NP  INSULIN  SYRINGE .5CC/29G 29G X 1/2 0.5 ML MISC Use to inject insulin  twice daily. 09/13/23   Therisa Benton PARAS, NP    Current Facility-Administered Medications  Medication Dose Route Frequency Provider Last Rate Last Admin   acetaminophen  (TYLENOL ) tablet 650 mg  650 mg Oral Q6H PRN Mansy, Jan A, MD   650 mg at 01/28/24 9052   Or   acetaminophen  (TYLENOL ) suppository 650 mg  650 mg Rectal Q6H PRN Mansy, Jan A, MD       albuterol  (PROVENTIL ) (2.5 MG/3ML) 0.083% nebulizer solution 2.5 mg  2.5 mg Nebulization Q2H PRN Cheryle Page, MD   2.5 mg at 02/01/24 0857   arformoterol  (BROVANA ) nebulizer solution 15 mcg  15 mcg Nebulization BID Ricky Fines, MD   15 mcg at 03-Feb-2024 9242   budesonide  (PULMICORT ) nebulizer solution 0.5 mg  0.5 mg Nebulization BID Ricky Fines, MD   0.5 mg at 29-Feb-2024 9242   Chlorhexidine  Gluconate Cloth 2 % PADS 6 each  6 each Topical Q0600 Cheryle Page, MD   6 each at 29-Feb-2024 0755   dexmedetomidine  (PRECEDEX ) 400 MCG/100ML (4 mcg/mL) infusion  0-0.8 mcg/kg/hr Intravenous Titrated Ogan, Okoronkwo U, MD 3.77 mL/hr at Feb 29, 2024 0800 0.2 mcg/kg/hr at 02/29/2024 0800   fluticasone  (FLONASE ) 50 MCG/ACT nasal spray 2 spray  2 spray Each Nare Daily Mansy, Jan A, MD   2 spray at 02/01/24 9095   folic acid  (FOLVITE ) tablet 1 mg  1 mg Oral Daily Vann, Jessica U, DO   1 mg at 01/31/24 1006   insulin  aspart (novoLOG ) injection 0-6 Units  0-6 Units Subcutaneous Q4H Vann, Jessica U, DO   1 Units at 01/30/24 2049   lactulose  (CHRONULAC ) 10 GM/15ML solution 20 g  20 g Oral BID Ricky Fines, MD   20 g at 02/01/24 0901   magnesium  hydroxide (MILK OF MAGNESIA) suspension 30 mL  30 mL Oral Daily PRN Mansy, Jan A, MD       magnesium  oxide (MAG-OX) tablet 400 mg  400 mg Oral Daily Ricky Fines, MD   400 mg at 01/31/24 1006    midodrine  (PROAMATINE ) tablet 10 mg  10 mg Oral TID WC Cheryle Page, MD   10 mg at 02/01/24 0750   multivitamin with minerals tablet 1 tablet  1 tablet Oral Daily Vann, Jessica U, DO   1 tablet at 01/31/24 1003   norepinephrine  (LEVOPHED ) 16 mg in 250mL (0.064 mg/mL) premix infusion  0-40 mcg/min Intravenous Titrated Cheryle, Kshitiz, MD 18.75 mL/hr at February 29, 2024 0800 20 mcg/min at 02-29-2024 0800   ondansetron  (ZOFRAN ) tablet 4 mg  4 mg Oral Q6H PRN Mansy, Jan A, MD       Or   ondansetron  (ZOFRAN ) injection 4 mg  4 mg Intravenous Q6H PRN Mansy, Madison LABOR, MD       Oral care mouth rinse  15 mL Mouth Rinse PRN Ricky Fines, MD       pantoprazole  (PROTONIX ) injection 40 mg  40 mg Intravenous Q12H Vann, Jessica U, DO   40 mg at 02/01/24 2125   piperacillin -tazobactam (ZOSYN ) IVPB 3.375 g  3.375 g Intravenous Q8H Claudene Toribio BROCKS, MD 12.5 mL/hr at 29-Feb-2024 0700 Infusion Verify at 02/29/24 0700   revefenacin  (YUPELRI ) nebulizer solution 175 mcg  175 mcg Nebulization Daily Payne, John D, PA-C   175 mcg at 03-Feb-2024 0756   rifaximin  (XIFAXAN ) tablet 550 mg  550 mg Oral BID Castaneda Mayorga, Daniel, MD   [phone]  mg at 02/01/24 0901   sodium chloride  flush (NS) 0.9 % injection 10-40 mL  10-40 mL Intracatheter Q12H Cheryle Page, MD   10 mL at 02/01/24 2125   sodium chloride  flush (NS) 0.9 % injection 10-40 mL  10-40 mL Intracatheter PRN Cheryle Page, MD       thiamine  (VITAMIN B1) 500 mg in sodium chloride  0.9 % 50 mL IVPB  500 mg Intravenous Q8H Claudene Toribio BROCKS, MD   Stopped at Mar 01, 2024 0532   vancomycin  (VANCOREADY) IVPB 1250 mg/250 mL  1,250 mg Intravenous Q36H Denna Harlene BROCKS, RPH   Stopped at 02/01/24 1744   vasopressin  (PITRESSIN) 20 Units in 100 mL (0.2 unit/mL) infusion-*FOR SHOCK*  0.04 Units/min Intravenous Continuous Claudene Toribio BROCKS, MD 12 mL/hr at 03/01/24 0800 0.04 Units/min at 03/01/24 0800    Allergies as of 01/25/2024   (No Known Allergies)    Social History   Socioeconomic History    Marital status: Divorced    Spouse name: Not on file   Number of children: 2   Years of education: 12   Highest education level: Associate degree: occupational, scientist, product/process development, or vocational program  Occupational History   Not on file  Tobacco Use   Smoking status: Every Day    Current packs/day: 1.00    Average packs/day: 1 pack/day for 40.0 years (40.0 ttl pk-yrs)    Types: Cigarettes    Passive exposure: Current   Smokeless tobacco: Never   Tobacco comments:    Smoking Cessation Classes Offered.  Vaping Use   Vaping status: Former  Substance and Sexual Activity   Alcohol  use: Yes    Alcohol /week: 10.0 standard drinks of alcohol     Types: 10 Glasses of wine per week    Comment: occassional wine- 2-3 times weekly   Drug use: Never   Sexual activity: Not Currently    Partners: Male    Birth control/protection: Abstinence, Post-menopausal, None  Other Topics Concern   Not on file  Social History Narrative   Lives with her room mate    Social Drivers of Health   Financial Resource Strain: Low Risk  (01/12/2024)   Overall Financial Resource Strain (CARDIA)    Difficulty of Paying Living Expenses: Not very hard  Food Insecurity: No Food Insecurity (01/26/2024)   Hunger Vital Sign    Worried About Running Out of Food in the Last Year: Never true    Ran Out of Food in the Last Year: Never true  Transportation Needs: Unmet Transportation Needs (01/26/2024)   PRAPARE - Administrator, Civil Service (Medical): Yes    Lack of Transportation (Non-Medical): No  Physical Activity: Insufficiently Active (01/12/2024)   Exercise Vital Sign    Days of Exercise per Week: 1 day    Minutes of Exercise per Session: 10 min  Stress: Stress Concern Present (01/12/2024)   [employer] of Occupational Health - Occupational Stress Questionnaire    Feeling of Stress : Rather much  Social Connections: Socially Isolated (01/26/2024)   Social Connection and Isolation Panel [NHANES]     Frequency of Communication with Friends and Family: Twice a week    Frequency of Social Gatherings with Friends and Family: Never    Attends Religious Services: Never    Database Administrator or Organizations: No    Attends Banker Meetings: Never    Marital Status: Divorced  Catering Manager Violence: Not At Risk (01/26/2024)   Humiliation, Afraid, Rape, and Kick questionnaire  Fear of Current or Ex-Partner: No    Emotionally Abused: No    Physically Abused: No    Sexually Abused: No     Code Status   Code Status: Full Code  Review of Systems: All systems reviewed and negative except where noted in HPI.  Physical Exam: Vital signs in last 24 hours: Temp:  [97.1 F (36.2 C)-98.9 F (37.2 C)] 97.1 F (36.2 C) 02-26-24 0801) Pulse Rate:  [107-123] 108 02/26/24 0830) Resp:  [14-44] 25 26-Feb-2024 0830) BP: (60-146)/(20-116) 88/49 02/26/2024 0830) SpO2:  [85 %-99 %] 90 % 02-26-24 0830) FiO2 (%):  [60 %-100 %] 80 % 02/26/2024 0801) Weight:  [76.1 kg] 76.1 kg 02-26-2024 0500) Last BM Date : 02/01/24  General:  Restless female on BiPaP.  Eyes: BiPaP strap not allowing eyes to open for assessment Nose: No deformity, discharge or lesions Neck:  Supple, no masses felt Lungs:  Bilateral crackles, congestion.  Heart:  Regular rate  Abdomen:  Soft, nondistended, nontender, active bowel sounds,  Rectal :  Deferred Msk: Symmetrical without gross deformities.  Neurologic:  Restless, not following commands Extremities : No edema Skin:  Intact without significant lesions.    Intake/Output from previous day: 02/06 0701 - February 26, 2024 0700 In: 2057.6 [I.V.:1042.5; IV Piggyback:1015.1] Out: 950 [Urine:375; Stool:575] Intake/Output this shift:  Total I/O In: 50.4 [I.V.:38.1; IV Piggyback:12.3] Out: 0    Vina Dasen, NP-C   Feb 26, 2024, 9:08 AM

## 2024-02-24 DEATH — deceased

## 2024-02-29 ENCOUNTER — Ambulatory Visit: Payer: Medicare HMO | Admitting: Audiology

## 2024-03-13 LAB — ACID FAST CULTURE WITH REFLEXED SENSITIVITIES (MYCOBACTERIA): Acid Fast Culture: NEGATIVE

## 2024-03-14 ENCOUNTER — Ambulatory Visit (HOSPITAL_COMMUNITY): Payer: Medicare HMO

## 2024-03-29 ENCOUNTER — Ambulatory Visit: Payer: Medicare HMO | Admitting: Internal Medicine

## 2024-04-16 ENCOUNTER — Ambulatory Visit: Payer: Medicare HMO | Admitting: Gastroenterology

## 2025-01-20 ENCOUNTER — Ambulatory Visit: Payer: Medicare HMO

## 2025-01-21 ENCOUNTER — Ambulatory Visit: Payer: Medicare HMO
